# Patient Record
Sex: Female | Born: 1970
Health system: Southern US, Community
[De-identification: ages and names within clinical notes are randomized; demographics above are authoritative.]

## PROBLEM LIST (undated history)

## (undated) DIAGNOSIS — Z9889 Other specified postprocedural states: Secondary | ICD-10-CM

## (undated) DIAGNOSIS — R1031 Right lower quadrant pain: Secondary | ICD-10-CM

## (undated) DIAGNOSIS — F1010 Alcohol abuse, uncomplicated: Secondary | ICD-10-CM

## (undated) DIAGNOSIS — L308 Other specified dermatitis: Secondary | ICD-10-CM

## (undated) DIAGNOSIS — R112 Nausea with vomiting, unspecified: Secondary | ICD-10-CM

## (undated) DIAGNOSIS — R1010 Upper abdominal pain, unspecified: Secondary | ICD-10-CM

## (undated) DIAGNOSIS — J45909 Unspecified asthma, uncomplicated: Secondary | ICD-10-CM

## (undated) DIAGNOSIS — F419 Anxiety disorder, unspecified: Secondary | ICD-10-CM

## (undated) DIAGNOSIS — I10 Essential (primary) hypertension: Secondary | ICD-10-CM

## (undated) DIAGNOSIS — R002 Palpitations: Secondary | ICD-10-CM

## (undated) DIAGNOSIS — M1990 Unspecified osteoarthritis, unspecified site: Secondary | ICD-10-CM

## (undated) DIAGNOSIS — G4700 Insomnia, unspecified: Secondary | ICD-10-CM

## (undated) DIAGNOSIS — S43101A Unspecified dislocation of right acromioclavicular joint, initial encounter: Secondary | ICD-10-CM

## (undated) HISTORY — DX: Insomnia, unspecified: G47.00

## (undated) HISTORY — PX: FRACTURE SURGERY: SHX138

## (undated) HISTORY — PX: TUBAL LIGATION: SHX77

## (undated) HISTORY — DX: Alcohol abuse, uncomplicated: F10.10

## (undated) HISTORY — PX: FOOT FRACTURE SURGERY: SHX645

## (undated) HISTORY — DX: Right lower quadrant pain: R10.31

## (undated) HISTORY — PX: ABDOMINAL HYSTERECTOMY: SHX81

## (undated) HISTORY — DX: Other specified dermatitis: L30.8

## (undated) HISTORY — DX: Unspecified dislocation of right acromioclavicular joint, initial encounter: S43.101A

## (undated) HISTORY — DX: Palpitations: R00.2

## (undated) HISTORY — PX: PERCUTANEOUS PINNING PHALANX FRACTURE OF HAND: SUR1027

## (undated) HISTORY — DX: Anxiety disorder, unspecified: F41.9

## (undated) HISTORY — DX: Upper abdominal pain, unspecified: R10.10

## (undated) HISTORY — DX: Unspecified osteoarthritis, unspecified site: M19.90

---

## 1997-11-28 ENCOUNTER — Inpatient Hospital Stay (HOSPITAL_COMMUNITY): Admission: AD | Admit: 1997-11-28 | Discharge: 1997-11-28 | Payer: Self-pay | Admitting: Obstetrics and Gynecology

## 1998-01-25 ENCOUNTER — Inpatient Hospital Stay (HOSPITAL_COMMUNITY): Admission: AD | Admit: 1998-01-25 | Discharge: 1998-01-28 | Payer: Self-pay | Admitting: Obstetrics and Gynecology

## 1998-02-05 ENCOUNTER — Inpatient Hospital Stay (HOSPITAL_COMMUNITY): Admission: AD | Admit: 1998-02-05 | Discharge: 1998-02-09 | Payer: Self-pay | Admitting: Obstetrics and Gynecology

## 2000-01-18 ENCOUNTER — Encounter: Payer: Self-pay | Admitting: Emergency Medicine

## 2000-01-18 ENCOUNTER — Emergency Department (HOSPITAL_COMMUNITY): Admission: EM | Admit: 2000-01-18 | Discharge: 2000-01-18 | Payer: Self-pay | Admitting: Emergency Medicine

## 2000-06-29 ENCOUNTER — Other Ambulatory Visit: Admission: RE | Admit: 2000-06-29 | Discharge: 2000-06-29 | Payer: Self-pay | Admitting: *Deleted

## 2000-06-29 ENCOUNTER — Other Ambulatory Visit: Admission: RE | Admit: 2000-06-29 | Discharge: 2000-06-29 | Payer: Self-pay | Admitting: Family Medicine

## 2001-08-03 ENCOUNTER — Ambulatory Visit (HOSPITAL_BASED_OUTPATIENT_CLINIC_OR_DEPARTMENT_OTHER): Admission: RE | Admit: 2001-08-03 | Discharge: 2001-08-03 | Payer: Self-pay | Admitting: Orthopedic Surgery

## 2002-01-13 ENCOUNTER — Encounter: Admission: RE | Admit: 2002-01-13 | Discharge: 2002-01-13 | Payer: Self-pay

## 2002-03-09 ENCOUNTER — Encounter: Payer: Self-pay | Admitting: Emergency Medicine

## 2002-03-09 ENCOUNTER — Emergency Department (HOSPITAL_COMMUNITY): Admission: EM | Admit: 2002-03-09 | Discharge: 2002-03-09 | Payer: Self-pay | Admitting: Emergency Medicine

## 2003-06-20 ENCOUNTER — Other Ambulatory Visit: Admission: RE | Admit: 2003-06-20 | Discharge: 2003-06-20 | Payer: Self-pay | Admitting: Obstetrics and Gynecology

## 2010-05-02 ENCOUNTER — Emergency Department (HOSPITAL_COMMUNITY): Admission: EM | Admit: 2010-05-02 | Discharge: 2010-05-02 | Payer: Self-pay | Admitting: Emergency Medicine

## 2010-09-20 ENCOUNTER — Emergency Department
Admission: AD | Admit: 2010-09-20 | Discharge: 2010-09-20 | Payer: Self-pay | Source: Home / Self Care | Attending: Emergency Medicine | Admitting: Emergency Medicine

## 2010-09-22 LAB — CBC
HCT: 34.2 % — ABNORMAL LOW (ref 36.0–46.0)
Hemoglobin: 10.9 g/dL — ABNORMAL LOW (ref 12.0–15.0)
MCH: 26.5 pg (ref 26.0–34.0)
MCHC: 31.9 g/dL (ref 30.0–36.0)
MCV: 83 fL (ref 78.0–100.0)
Platelets: 271 10*3/uL (ref 150–400)
RBC: 4.12 MIL/uL (ref 3.87–5.11)
RDW: 14.4 % (ref 11.5–15.5)
WBC: 8 10*3/uL (ref 4.0–10.5)

## 2010-09-22 LAB — GRAM STAIN

## 2010-09-22 LAB — DIFFERENTIAL
Basophils Absolute: 0 10*3/uL (ref 0.0–0.1)
Basophils Relative: 0 % (ref 0–1)
Eosinophils Absolute: 0.4 10*3/uL (ref 0.0–0.7)
Eosinophils Relative: 5 % (ref 0–5)
Lymphocytes Relative: 20 % (ref 12–46)
Lymphs Abs: 1.6 10*3/uL (ref 0.7–4.0)
Monocytes Absolute: 0.6 10*3/uL (ref 0.1–1.0)
Monocytes Relative: 7 % (ref 3–12)
Neutro Abs: 5.4 10*3/uL (ref 1.7–7.7)
Neutrophils Relative %: 68 % (ref 43–77)

## 2010-09-22 LAB — COMPREHENSIVE METABOLIC PANEL
ALT: 13 U/L (ref 0–35)
AST: 17 U/L (ref 0–37)
Albumin: 3.4 g/dL — ABNORMAL LOW (ref 3.5–5.2)
Alkaline Phosphatase: 46 U/L (ref 39–117)
BUN: 3 mg/dL — ABNORMAL LOW (ref 6–23)
CO2: 25 mEq/L (ref 19–32)
Calcium: 8.4 mg/dL (ref 8.4–10.5)
Chloride: 105 mEq/L (ref 96–112)
Creatinine, Ser: 0.83 mg/dL (ref 0.4–1.2)
GFR calc Af Amer: >60 mL/min (ref >60)
GFR calc non Af Amer: >60 mL/min (ref >60)
Glucose, Bld: 93 mg/dL (ref 70–99)
Potassium: 3.6 mEq/L (ref 3.5–5.1)
Sodium: 137 mEq/L (ref 135–145)
Total Bilirubin: 0.5 mg/dL (ref 0.3–1.2)
Total Protein: 6.2 g/dL (ref 6.0–8.3)

## 2010-09-22 LAB — PROTIME-INR
INR: 0.97 (ref 0.00–1.49)
Prothrombin Time: 13.1 seconds (ref 11.6–15.2)

## 2010-09-22 LAB — APTT: aPTT: 28 seconds (ref 24–37)

## 2010-09-22 LAB — SEDIMENTATION RATE: Sed Rate: 11 mm/hr (ref 0–22)

## 2010-09-22 LAB — SYNOVIAL CELL COUNT + DIFF, W/ CRYSTALS
Crystals, Fluid: NONE SEEN
Lymphocytes-Synovial Fld: 3 % (ref 0–20)
Monocyte-Macrophage-Synovial Fluid: 7 % — ABNORMAL LOW (ref 50–90)
Neutrophil, Synovial: 90 % — ABNORMAL HIGH (ref 0–25)
WBC, Synovial: 7377 /mm3 — ABNORMAL HIGH (ref 0–200)

## 2010-09-29 ENCOUNTER — Emergency Department (HOSPITAL_COMMUNITY)
Admission: EM | Admit: 2010-09-29 | Discharge: 2010-09-29 | Payer: Self-pay | Source: Home / Self Care | Admitting: Emergency Medicine

## 2010-09-29 LAB — BODY FLUID CULTURE: Culture: NO GROWTH

## 2011-01-23 NOTE — Op Note (Signed)
[facility]  Patient:    Kelly Walton, Kelly Walton Visit Number: [id number] MRN: [id number]          Service Type: DSU Location: [facility] Attending Physician:  Marlowe Shores Dictated by:   Artist Pais Mina Marble, M.D. Proc. Date: 08/03/01 Admit Date:  08/03/2001                             Operative Report  PREOPERATIVE DIAGNOSIS:  Retained hardware, right hand.  POSTOPERATIVE DIAGNOSIS:  Retained hardware, right hand.  PROCEDURE:  Removal of hardware x 5, right hand.  SURGEON:  Artist Pais. Mina Marble, M.D.  ASSISTANT:  None.  ANESTHESIA:  General.  TOURNIQUET TIME:  One hour 11 minutes.  DRAINS:  None.  COMPLICATIONS:  Intraoperative screw breakage.  DESCRIPTION:  Patient was taken to the operating room where after the induction of adequate general anesthesia the right upper extremity was prepped and draped in usual sterile fashion.  An Esmarch was used to exsanguinate the limb.  The tourniquet was inflated to 250 mmHg.  At this point in time, a 2 cm incision was made over the interval between the index and long fingers and dissection was carried down toward the index finger.  There were two screws in the metacarpal of the index.  One was removed uneventfully; one the screw head broke and needed to be then removed down to the flush level of the bone and filed with a rasp.  At this point in time a screw in the long finger metacarpal was removed without complication and two from the ring finger were removed through a separate incision 2 cm in length centered over the ring finger metacarpal.  Incisions were then irrigated thoroughly with normal saline.  Hemostasis was achieved with bipolar cautery and then they were both closed with a running 3-0 Prolene subcuticular stitch.  Steri-Strips, 4x4s, fluffs, and compressive dressing as well as a volar splint were applied.  The patient tolerated the procedure well and went to recovery room in  stable fashion. Dictated by:   Artist Pais Mina Marble, M.D. Attending Physician:  Marlowe Shores DD:  08/03/01 TD:  08/03/01 Job: [id number]

## 2011-10-21 ENCOUNTER — Emergency Department (HOSPITAL_COMMUNITY)
Admission: EM | Admit: 2011-10-21 | Discharge: 2011-10-21 | Disposition: A | Discharge status: Home / Self Care | Payer: BC Managed Care – PPO | Attending: Emergency Medicine | Admitting: Emergency Medicine

## 2011-10-21 ENCOUNTER — Encounter (HOSPITAL_COMMUNITY): Payer: Self-pay

## 2011-10-21 ENCOUNTER — Emergency Department (HOSPITAL_COMMUNITY): Payer: BC Managed Care – PPO

## 2011-10-21 DIAGNOSIS — R1032 Left lower quadrant pain: Secondary | ICD-10-CM | POA: Insufficient documentation

## 2011-10-21 DIAGNOSIS — N949 Unspecified condition associated with female genital organs and menstrual cycle: Secondary | ICD-10-CM | POA: Insufficient documentation

## 2011-10-21 DIAGNOSIS — R112 Nausea with vomiting, unspecified: Secondary | ICD-10-CM | POA: Insufficient documentation

## 2011-10-21 DIAGNOSIS — R10814 Left lower quadrant abdominal tenderness: Secondary | ICD-10-CM | POA: Insufficient documentation

## 2011-10-21 DIAGNOSIS — Z79899 Other long term (current) drug therapy: Secondary | ICD-10-CM | POA: Insufficient documentation

## 2011-10-21 DIAGNOSIS — N730 Acute parametritis and pelvic cellulitis: Secondary | ICD-10-CM | POA: Insufficient documentation

## 2011-10-21 LAB — CBC
HCT: 34.5 % — ABNORMAL LOW (ref 36.0–46.0)
Hemoglobin: 11.2 g/dL — ABNORMAL LOW (ref 12.0–15.0)
MCH: 25.5 pg — ABNORMAL LOW (ref 26.0–34.0)
MCHC: 32.5 g/dL (ref 30.0–36.0)
MCV: 78.6 fL (ref 78.0–100.0)
Platelets: 271 10*3/uL (ref 150–400)
RBC: 4.39 MIL/uL (ref 3.87–5.11)
RDW: 14.8 % (ref 11.5–15.5)
WBC: 16.4 10*3/uL — ABNORMAL HIGH (ref 4.0–10.5)

## 2011-10-21 LAB — BASIC METABOLIC PANEL
BUN: 9 mg/dL (ref 6–23)
CO2: 22 mEq/L (ref 19–32)
Calcium: 8.9 mg/dL (ref 8.4–10.5)
Chloride: 107 mEq/L (ref 96–112)
Creatinine, Ser: 0.77 mg/dL (ref 0.50–1.10)
GFR calc Af Amer: >90 mL/min (ref >90)
GFR calc non Af Amer: >90 mL/min (ref >90)
Glucose, Bld: 111 mg/dL — ABNORMAL HIGH (ref 70–99)
Potassium: 3.8 mEq/L (ref 3.5–5.1)
Sodium: 142 mEq/L (ref 135–145)

## 2011-10-21 LAB — DIFFERENTIAL
Basophils Absolute: 0 10*3/uL (ref 0.0–0.1)
Basophils Relative: 0 % (ref 0–1)
Eosinophils Absolute: 0 10*3/uL (ref 0.0–0.7)
Eosinophils Relative: 0 % (ref 0–5)
Lymphocytes Relative: 3 % — ABNORMAL LOW (ref 12–46)
Lymphs Abs: 0.5 10*3/uL — ABNORMAL LOW (ref 0.7–4.0)
Monocytes Absolute: 0.3 10*3/uL (ref 0.1–1.0)
Monocytes Relative: 2 % — ABNORMAL LOW (ref 3–12)
Neutro Abs: 15.5 10*3/uL — ABNORMAL HIGH (ref 1.7–7.7)
Neutrophils Relative %: 95 % — ABNORMAL HIGH (ref 43–77)

## 2011-10-21 LAB — URINALYSIS, ROUTINE W REFLEX MICROSCOPIC
Bilirubin Urine: NEGATIVE
Glucose, UA: NEGATIVE mg/dL
Hgb urine dipstick: NEGATIVE
Ketones, ur: 15 mg/dL — AB
Leukocytes, UA: NEGATIVE
Nitrite: NEGATIVE
Protein, ur: NEGATIVE mg/dL
Specific Gravity, Urine: 1.017 (ref 1.005–1.030)
Urobilinogen, UA: 0.2 mg/dL (ref 0.0–1.0)
pH: 7.5 (ref 5.0–8.0)

## 2011-10-21 LAB — WET PREP, GENITAL: Trich, Wet Prep: NONE SEEN

## 2011-10-21 LAB — POCT PREGNANCY, URINE: Preg Test, Ur: NEGATIVE

## 2011-10-21 MED ORDER — OXYCODONE-ACETAMINOPHEN 5-325 MG PO TABS: ORAL_TABLET | ORAL | Status: AC

## 2011-10-21 MED ORDER — HYDROMORPHONE HCL PF 1 MG/ML IJ SOLN
0.5000 mg | Freq: Once | INTRAMUSCULAR | Status: AC
  Administered 2011-10-21: 1 mg via INTRAVENOUS
  Filled 2011-10-21: qty 1

## 2011-10-21 MED ORDER — SODIUM CHLORIDE 0.9 % IV BOLUS (SEPSIS)
500.0000 mL | Freq: Once | INTRAVENOUS | Status: AC
  Administered 2011-10-21: 1000 mL via INTRAVENOUS

## 2011-10-21 MED ORDER — ONDANSETRON HCL 4 MG/2ML IJ SOLN
4.0000 mg | Freq: Once | INTRAMUSCULAR | Status: AC
  Administered 2011-10-21: 4 mg via INTRAVENOUS
  Filled 2011-10-21: qty 2

## 2011-10-21 MED ORDER — DOXYCYCLINE HYCLATE 100 MG PO CAPS: 100.0000 mg | ORAL_CAPSULE | Freq: Two times a day (BID) | ORAL | Status: AC

## 2011-10-21 MED ORDER — IBUPROFEN 800 MG PO TABS: 800.0000 mg | ORAL_TABLET | Freq: Three times a day (TID) | ORAL | Status: AC

## 2011-10-21 MED ORDER — ONDANSETRON HCL 4 MG PO TABS: 4.0000 mg | ORAL_TABLET | Freq: Four times a day (QID) | ORAL | Status: AC

## 2011-10-21 MED ORDER — LIDOCAINE HCL (PF) 1 % IJ SOLN
INTRAMUSCULAR | Status: AC
  Administered 2011-10-21: 1 mL
  Filled 2011-10-21: qty 5

## 2011-10-21 MED ORDER — HYDROMORPHONE HCL PF 1 MG/ML IJ SOLN
1.0000 mg | Freq: Once | INTRAMUSCULAR | Status: AC
  Administered 2011-10-21: 1 mg via INTRAVENOUS
  Filled 2011-10-21: qty 1

## 2011-10-21 MED ORDER — CEFTRIAXONE SODIUM 250 MG IJ SOLR
250.0000 mg | Freq: Once | INTRAMUSCULAR | Status: AC
  Administered 2011-10-21: 250 mg via INTRAMUSCULAR
  Filled 2011-10-21: qty 250

## 2011-10-21 MED ORDER — AZITHROMYCIN 1 G PO PACK: 1.0000 g | PACK | Freq: Once | ORAL | Status: DC

## 2011-10-21 MED ORDER — KETOROLAC TROMETHAMINE 30 MG/ML IJ SOLN
30.0000 mg | Freq: Once | INTRAMUSCULAR | Status: AC
  Administered 2011-10-21: 30 mg via INTRAVENOUS
  Filled 2011-10-21: qty 1

## 2011-10-21 NOTE — ED Notes (Signed)
Patient transported to Ultrasound 

## 2011-10-21 NOTE — Discharge Instructions (Signed)
Take antibiotic for complete course. Use ibuprofen for inflammation and pain with percocet for breakthrough pain but do not drive or operate machinery with percocet use. You may need an over the counter stool softener with percocet use. Use zofran as needed for nausea. Follow up with your primary care provider as needed for recheck of ongoing mild pain but return to ER for increasing pain, fevers, chills, increasing hip pain or any other changing or worsening of symptoms.   Pelvic Inflammatory Disease Pelvic Inflammatory Disease (PID) is an infection in some or all of your female organs. This includes the womb (uterus), ovaries, fallopian tubes and tissues in the pelvis. PID is a common cause of sudden onset (acute) lower abdominal (pelvic) pain. PID can be treated, but it is a serious infection. It may take weeks before you are completely well. In some cases, hospitalization is needed for surgery or to administer medications to kill germs (antibiotics) through your veins (intravenously). CAUSES   It may be caused by germs that are spread during sexual contact.   PID can also occur following:   The birth of a baby.   A miscarriage.   An abortion.   Major surgery of the pelvis.   Use of an IUD.   Sexual assault.  SYMPTOMS   Abdominal or pelvic pain.   Fever.   Chills.   Abnormal vaginal discharge.  DIAGNOSIS  Your caregiver will choose some of these methods to make a diagnosis:  A physical exam and history.   Blood tests.   Cultures of the vagina and cervix.   X-rays or ultrasound.   A procedure to look inside the pelvis (laparoscopy).  TREATMENT   Use of antibiotics by mouth or intravenously.   Treatment of sexual partners when the infection is an sexually transmitted disease (STD).   Hospitalization and surgery may be needed.  RISKS AND COMPLICATIONS   PID can cause women to become unable to have children (sterile) if left untreated or if partially treated. That is  why it is important to finish all medications given to you.   Sterility or future tubal (ectopic) pregnancies can occur in fully treated individuals. This is why it is so important to follow your prescribed treatment.   It can cause longstanding (chronic) pelvic pain after frequent infections.   Painful intercourse.   Pelvic abscesses.   In rare cases, surgery or a hysterectomy may be needed.   If this is a sexually transmitted infection (STI), you are also at risk for any other STD including AIDSor human papillomavirus (HPV).  HOME CARE INSTRUCTIONS   Finish all medication as prescribed. Incomplete treatment will put you at risk for sterility and tubal pregnancy.   Only take over-the-counter or prescription medicines for pain, discomfort, or fever as directed by your caregiver.   Do not have sex until treatment is completed or as directed by your caregiver. If PID is confirmed, your recent sexual contacts will need treatment.   Keep your follow-up appointments.  SEEK MEDICAL CARE IF:   You have increased or abnormal vaginal discharge.   You need prescription medication for your pain.   Your partner has an STD.   You are vomiting.   You cannot take your medications.  SEEK IMMEDIATE MEDICAL CARE IF:   You have a fever.   You develop increased abdominal or pelvic pain.   You develop chills.   You have pain when you urinate.   You are not better after 72 hours following treatment.  Document Released: 08/24/2005 Document Revised: 05/06/2011 Document Reviewed: 05/07/2007 [facility] Patient Information 2012 [place], [place].

## 2011-10-21 NOTE — ED Provider Notes (Signed)
Medical screening examination/treatment/procedure(s) were performed by non-physician practitioner and as supervising physician I was immediately available for consultation/collaboration.   Jaquasia Doscher, MD 10/21/11 1551 

## 2011-10-21 NOTE — ED Notes (Signed)
Lt groin pain onset at 0200 this morning

## 2011-10-21 NOTE — ED Provider Notes (Signed)
History     CSN: [id number]  Arrival date & time 10/21/11  0807   First MD Initiated Contact with Patient 10/21/11 [phone]      Chief Complaint  Patient presents with  . Groin Pain    (Consider location/radiation/quality/duration/timing/severity/associated sxs/prior treatment) HPI  Patient presents to the ER complaining of acute onset LLQ pain that woke her from her sleep at 2am with persistent severe, constant pain with associated nausea and vomiting x 2. Patient states she is currently menstruating. Patient states pain is similar to pain she had approximately a year ago when she "had some fluid pulled off left hip" but she is unable to give more specific details to that episode of pain or diagnosis. Denies fevers, chills, CP, SOB, diarrhea, dysuria, hematuria, blood in her stool, vaginal d/c or pelvic pain. Has taken nothing for pain PTA. Denies aggravating or alleviating factors.   No past medical history on file.  Past Surgical History  Procedure Date  . Tubal ligation     No family history on file.  History  Substance Use Topics  . Smoking status: Not on file  . Smokeless tobacco: Never Used  . Alcohol Use: Yes    OB History    Grav Para Term Preterm Abortions TAB SAB Ect Mult Living                  Review of Systems  All other systems reviewed and are negative.    Allergies  Phenergan and Sulfa antibiotics  Home Medications   Current Outpatient Rx  Name Route Sig Dispense Refill  . ALPRAZOLAM 0.25 MG PO TABS Oral Take 0.25 mg by mouth at bedtime as needed. As needed for sleep.      BP 138/93  Pulse 102  Temp(Src) 96.7 F (35.9 C) (Oral)  Resp 16  SpO2 97%  LMP 10/17/2011  Physical Exam  Nursing note and vitals reviewed. Constitutional: She is oriented to person, place, and time. She appears well-developed and well-nourished. No distress.       Uncomfortable appearing  HENT:  Head: Normocephalic and atraumatic.  Eyes: Conjunctivae are normal.    Neck: Normal range of motion. Neck supple.  Cardiovascular: Normal rate, regular rhythm, normal heart sounds and intact distal pulses.  Exam reveals no gallop and no friction rub.   No murmur heard. Pulmonary/Chest: Effort normal and breath sounds normal. No respiratory distress. She has no wheezes. She has no rales. She exhibits no tenderness.  Abdominal: Soft. Bowel sounds are normal. She exhibits no distension and no mass. There is tenderness. There is no rebound and no guarding.       LLQ with guarding but no rididity  Genitourinary: Vagina normal. Cervix exhibits motion tenderness. Cervix exhibits no discharge and no friability. Right adnexum displays no mass, no tenderness and no fullness. Left adnexum displays tenderness. Left adnexum displays no mass and no fullness.       Scant blood tinged mucus d/c from cervix  Musculoskeletal: Normal range of motion. She exhibits no edema and no tenderness.  Neurological: She is alert and oriented to person, place, and time.  Skin: Skin is warm and dry. No rash noted. She is not diaphoretic. No erythema.  Psychiatric: She has a normal mood and affect.    ED Course  Procedures (including critical care time)  IV dilaudid and zofran  Labs Reviewed  CBC - Abnormal; Notable for the following:    WBC 16.4 (*)    Hemoglobin 11.2 (*)  HCT 34.5 (*)    MCH 25.5 (*)    All other components within normal limits  DIFFERENTIAL - Abnormal; Notable for the following:    Neutrophils Relative 95 (*)    Neutro Abs 15.5 (*)    Lymphocytes Relative 3 (*)    Lymphs Abs 0.5 (*)    Monocytes Relative 2 (*)    All other components within normal limits  BASIC METABOLIC PANEL - Abnormal; Notable for the following:    Glucose, Bld 111 (*)    All other components within normal limits  URINALYSIS, ROUTINE W REFLEX MICROSCOPIC - Abnormal; Notable for the following:    Ketones, ur 15 (*)    All other components within normal limits  WET PREP, GENITAL -  Abnormal; Notable for the following:    Clue Cells Wet Prep HPF POC FEW (*)    WBC, Wet Prep HPF POC MODERATE (*)    All other components within normal limits  POCT PREGNANCY, URINE  GC/CHLAMYDIA PROBE AMP, GENITAL   US Transvaginal Non-ob  10/21/2011  *RADIOLOGY REPORT*  Clinical Data: Pelvic pain.  TRANSABDOMINAL AND TRANSVAGINAL ULTRASOUND OF PELVIS DOPPLER ULTRASOUND OF OVARIES  Technique:  Both transabdominal and transvaginal ultrasound examinations of the pelvis were performed including evaluation of the uterus, ovaries, adnexal regions, and pelvic cul-de-sac. Color and duplex Doppler ultrasound was utilized to evaluate blood flow to the ovaries.  Comparison: None.  Findings:  Uterus: 7.5 x 3.4 x 4.1 cm.  Normal echotexture.  No focal abnormality.  Endometrium: Normal appearance and thickness, 6 mm.  Right Ovary: 2.2 x 1.3 x 1.8 cm. Normal size and echotexture.  No adnexal masses.  Normal arterial and venous blood flow.  Left Ovary: 2.4 x 1.4 x 2.2 cm. Normal size and echotexture.  No adnexal masses.  Normal arterial venous blood flow.  Other Findings:  No free fluid.  IMPRESSION: Unremarkable study.  No evidence of ovarian torsion.  Original Report Authenticated By: Cyndie Chime, M.D.   US Pelvis Complete  10/21/2011  *RADIOLOGY REPORT*  Clinical Data: Pelvic pain.  TRANSABDOMINAL AND TRANSVAGINAL ULTRASOUND OF PELVIS DOPPLER ULTRASOUND OF OVARIES  Technique:  Both transabdominal and transvaginal ultrasound examinations of the pelvis were performed including evaluation of the uterus, ovaries, adnexal regions, and pelvic cul-de-sac. Color and duplex Doppler ultrasound was utilized to evaluate blood flow to the ovaries.  Comparison: None.  Findings:  Uterus: 7.5 x 3.4 x 4.1 cm.  Normal echotexture.  No focal abnormality.  Endometrium: Normal appearance and thickness, 6 mm.  Right Ovary: 2.2 x 1.3 x 1.8 cm. Normal size and echotexture.  No adnexal masses.  Normal arterial and venous blood flow.   Left Ovary: 2.4 x 1.4 x 2.2 cm. Normal size and echotexture.  No adnexal masses.  Normal arterial venous blood flow.  Other Findings:  No free fluid.  IMPRESSION: Unremarkable study.  No evidence of ovarian torsion.  Original Report Authenticated By: Cyndie Chime, M.D.   [place] Art/ven Flow Abd Pelv Doppler  10/21/2011  *RADIOLOGY REPORT*  Clinical Data: Pelvic pain.  TRANSABDOMINAL AND TRANSVAGINAL ULTRASOUND OF PELVIS DOPPLER ULTRASOUND OF OVARIES  Technique:  Both transabdominal and transvaginal ultrasound examinations of the pelvis were performed including evaluation of the uterus, ovaries, adnexal regions, and pelvic cul-de-sac. Color and duplex Doppler ultrasound was utilized to evaluate blood flow to the ovaries.  Comparison: None.  Findings:  Uterus: 7.5 x 3.4 x 4.1 cm.  Normal echotexture.  No focal abnormality.  Endometrium: Normal appearance and  thickness, 6 mm.  Right Ovary: 2.2 x 1.3 x 1.8 cm. Normal size and echotexture.  No adnexal masses.  Normal arterial and venous blood flow.  Left Ovary: 2.4 x 1.4 x 2.2 cm. Normal size and echotexture.  No adnexal masses.  Normal arterial venous blood flow.  Other Findings:  No free fluid.  IMPRESSION: Unremarkable study.  No evidence of ovarian torsion.  Original Report Authenticated By: Cyndie Chime, M.D.   Patient is feeling better with decreased pain after IV pain meds. No Pain with ROM of hip. Afebrile. VSS.   I reviewed prior visit with MR showing effusion of left hip but patient now stating that with that pain she could not bear weight on hip and that any movement of pain hurt in lateral hip and groin which is different from LLQ/inguinal pain presently.   1. PID (acute pelvic inflammatory disease)       MDM  CMT and adnexal TTP with elevated white count on wet prep and elevated white count on CBC. Question PID. Abdomen without rigidity and LLQ improved. Spoke at length with patient changing hip pain or abdominal pain that would warrant return  to ER for CT scan of abd/pelvis or MR of hip, otherwise patient is agreeable to treatment for PID with abx and pain meds and following up with PCP.         Jenness Corner, [place] 10/21/11 1220

## 2011-10-21 NOTE — ED Notes (Signed)
In and out cath completed using sterile technique, clear urine return. Pt tolerated well.

## 2011-10-22 LAB — GC/CHLAMYDIA PROBE AMP, GENITAL
Chlamydia, DNA Probe: NEGATIVE
GC Probe Amp, Genital: NEGATIVE

## 2012-06-23 ENCOUNTER — Encounter (HOSPITAL_COMMUNITY): Payer: Self-pay | Admitting: Family Medicine

## 2012-06-23 ENCOUNTER — Emergency Department (HOSPITAL_COMMUNITY): Payer: BC Managed Care – PPO

## 2012-06-23 ENCOUNTER — Emergency Department (HOSPITAL_COMMUNITY)
Admission: EM | Admit: 2012-06-23 | Discharge: 2012-06-23 | Disposition: A | Discharge status: Home / Self Care | Payer: BC Managed Care – PPO | Attending: Emergency Medicine | Admitting: Emergency Medicine

## 2012-06-23 DIAGNOSIS — Z79899 Other long term (current) drug therapy: Secondary | ICD-10-CM | POA: Insufficient documentation

## 2012-06-23 DIAGNOSIS — R079 Chest pain, unspecified: Secondary | ICD-10-CM | POA: Insufficient documentation

## 2012-06-23 LAB — TROPONIN I: Troponin I: <0.3 ng/mL (ref <0.30)

## 2012-06-23 LAB — CBC WITH DIFFERENTIAL/PLATELET
Basophils Absolute: 0 10*3/uL (ref 0.0–0.1)
Basophils Relative: 1 % (ref 0–1)
Hemoglobin: 10.3 g/dL — ABNORMAL LOW (ref 12.0–15.0)
Lymphocytes Relative: 26 % (ref 12–46)
MCHC: 31.8 g/dL (ref 30.0–36.0)
Monocytes Relative: 7 % (ref 3–12)
Neutro Abs: 4.3 10*3/uL (ref 1.7–7.7)
Neutrophils Relative %: 62 % (ref 43–77)
WBC: 7 10*3/uL (ref 4.0–10.5)

## 2012-06-23 LAB — BASIC METABOLIC PANEL
BUN: 15 mg/dL (ref 6–23)
GFR calc Af Amer: 78 mL/min — ABNORMAL LOW (ref >90)
GFR calc non Af Amer: 67 mL/min — ABNORMAL LOW (ref >90)
Potassium: 4.2 mEq/L (ref 3.5–5.1)
Sodium: 137 mEq/L (ref 135–145)

## 2012-06-23 LAB — POCT I-STAT TROPONIN I

## 2012-06-23 MED ORDER — GI COCKTAIL ~~LOC~~
30.0000 mL | Freq: Once | ORAL | Status: AC
  Administered 2012-06-23: 30 mL via ORAL
  Filled 2012-06-23: qty 30

## 2012-06-23 MED ORDER — MORPHINE SULFATE 4 MG/ML IJ SOLN
4.0000 mg | Freq: Once | INTRAMUSCULAR | Status: AC
  Administered 2012-06-23: 4 mg via INTRAVENOUS
  Filled 2012-06-23: qty 1

## 2012-06-23 MED ORDER — ONDANSETRON HCL 4 MG/2ML IJ SOLN
4.0000 mg | Freq: Once | INTRAMUSCULAR | Status: AC
  Administered 2012-06-23: 4 mg via INTRAVENOUS
  Filled 2012-06-23: qty 2

## 2012-06-23 MED ORDER — HYDROCODONE-ACETAMINOPHEN 5-500 MG PO TABS: 1.0000 | ORAL_TABLET | Freq: Four times a day (QID) | ORAL | Status: DC | PRN

## 2012-06-23 MED ORDER — ASPIRIN 81 MG PO CHEW
324.0000 mg | CHEWABLE_TABLET | Freq: Once | ORAL | Status: AC
  Administered 2012-06-23: 324 mg via ORAL
  Filled 2012-06-23: qty 4

## 2012-06-23 NOTE — ED Provider Notes (Addendum)
Medical screening examination/treatment/procedure(s) were conducted as a shared visit with non-physician practitioner(s) and myself.  I personally evaluated the patient during the encounter  Pt stable.  No pleuritic type CP reported. PERC negative She is well appearing.  Only having brief episodes of pain in the room (lasts seconds) Suspicion for ACS/PE/Dissection is low.  I have reviewed EKG.  If repeat troponin negative can f/u as outpatient.  Joya Gaskins, MD 06/23/12 1618  4:47 PM Repeat EKG unchanged.  Repeat troponin negative.  She reports brief (one second) episode of CP without any associated symptoms.  PERC negative and I doubt PE.  She has some tenderness to palpation Very low risk for ACS, only risk factor is HTN and minimally suspicious with low HEART score.  Given this just happened today, doubt unstable angina.  She reports she was suppposed to see cardiology per her PCP for "skipped heart beats"  Advised her to go that appointment  Joya Gaskins, MD 06/23/12 1649

## 2012-06-23 NOTE — ED Notes (Signed)
Per pt was at work and had sudden onset of chest pain and then felt nauseous and vomiting. Pt denies cough, fever. Denies pain on palpation or movement.

## 2012-06-23 NOTE — ED Provider Notes (Signed)
History     CSN: [id number]  Arrival date & time 06/23/12  1223   First MD Initiated Contact with Patient 06/23/12 1256      Chief Complaint  Patient presents with  . Chest Pain    (Consider location/radiation/quality/duration/timing/severity/associated sxs/prior treatment) HPI Patient presents to the emergency department for evaluation of chest pain that was acute in onset and associated with diaphoresis, nausea and vomiting. She states that in which she was sitting and watching TV and had intermittent sharp knifelike chest pains in her left chest and under her breasts. She has not had any problems since then however she was retaining today when the pain started acutely and lasted for approximately 10 minutes but not resolving. At that point she decided to come to the emergency department. Since being in the ER the pain has been intermittent. Nothing seems to make them better or worse. Denies hx of cardiac dz.   History reviewed. No pertinent past medical history.  Past Surgical History  Procedure Date  . Tubal ligation     History reviewed. No pertinent family history.  History  Substance Use Topics  . Smoking status: Not on file  . Smokeless tobacco: Never Used  . Alcohol Use: Yes    OB History    Grav Para Term Preterm Abortions TAB SAB Ect Mult Living                  Review of Systems   Review of Systems  Gen: no weight loss, fevers, chills, night sweats  Eyes: no discharge or drainage, no occular pain or visual changes  Nose: no epistaxis or rhinorrhea  Mouth: no dental pain, no sore throat  Neck: no neck pain  Lungs:No wheezing, coughing or hemoptysis CV: + chest pain, no palpitations, dependent edema or orthopnea  Abd: no abdominal pain, + nausea, vomiting  GU: no dysuria or gross hematuria  MSK:  No abnormalities  Neuro: no headache, no focal neurologic deficits  Skin: no abnormalities Psyche: negative.    Allergies  Promethazine hcl and Sulfa  antibiotics  Home Medications   Current Outpatient Rx  Name Route Sig Dispense Refill  . ALBUTEROL SULFATE HFA 108 (90 BASE) MCG/ACT IN AERS Inhalation Inhale 2 puffs into the lungs every 4 (four) hours as needed. For shortness of breath    . HYDROCODONE-ACETAMINOPHEN 5-325 MG PO TABS Oral Take 1 tablet by mouth every 6 (six) hours as needed. For pain    . LISINOPRIL-HYDROCHLOROTHIAZIDE 20-12.5 MG PO TABS Oral Take 1 tablet by mouth daily.    Marland Kitchen ZOLPIDEM TARTRATE 10 MG PO TABS Oral Take 10 mg by mouth at bedtime as needed. For sleep    . HYDROCODONE-ACETAMINOPHEN 5-500 MG PO TABS Oral Take 1-2 tablets by mouth every 6 (six) hours as needed for pain. 15 tablet 0    BP 153/97  Pulse 86  Temp 98.1 F (36.7 C) (Oral)  Resp 20  SpO2 100%  LMP 05/31/2012  Physical Exam  Nursing note and vitals reviewed. Constitutional: She appears well-developed and well-nourished. No distress.  HENT:  Head: Normocephalic and atraumatic.  Eyes: Pupils are equal, round, and reactive to light.  Neck: Normal range of motion. Neck supple.  Cardiovascular: Normal rate and regular rhythm.   Pulmonary/Chest: Effort normal. She has no wheezes. She has no rales.  Abdominal: Soft. She exhibits no distension. There is no tenderness. There is no guarding.  Neurological: She is alert.  Skin: Skin is warm and dry.  ED Course  Procedures (including critical care time)  Labs Reviewed  BASIC METABOLIC PANEL - Abnormal; Notable for the following:    GFR calc non Af Amer 67 (*)     GFR calc Af Amer 78 (*)     All other components within normal limits  CBC WITH DIFFERENTIAL - Abnormal; Notable for the following:    Hemoglobin 10.3 (*)     HCT 32.4 (*)     MCV 77.0 (*)     MCH 24.5 (*)     RDW 16.1 (*)     All other components within normal limits  POCT I-STAT TROPONIN I  TROPONIN I   Dg Chest 2 View  06/23/2012  *RADIOLOGY REPORT*  Clinical Data: Chest pain shortness of breath  CHEST - 2 VIEW   Comparison: None.  Findings: The heart and pulmonary vascularity are within normal limits.  The lungs are clear bilaterally.  Bilateral nipple rings are seen.  No acute bony abnormality is noted.  IMPRESSION: No acute abnormality noted.   Original Report Authenticated By: Phillips Odor, M.D.      1. Chest pain       MDM  pts HEART score is 1 : Moderately Suspicious. No EKG changes, < 29 yo, No known risk factors, negative Troponin.   Date: 06/23/2012  Rate: 88  Rhythm: normal sinus rhythm  QRS Axis: normal  Intervals: normal  ST/T Wave abnormalities: normal  Conduction Disutrbances:none  Narrative Interpretation:   Old EKG Reviewed: none available '   Will repeat Troponin at 3:30pm. Pt hand off to Freda Munro, PA-C at end of shift       Dorthula Matas, PA 06/23/12 1512  Dorthula Matas, PA 06/23/12 1519

## 2012-06-23 NOTE — ED Notes (Signed)
Pt reports sharp intermittent pain in lower right quad. 7/10

## 2013-03-13 ENCOUNTER — Other Ambulatory Visit (HOSPITAL_COMMUNITY): Payer: Self-pay | Admitting: Cardiovascular Disease

## 2013-03-13 ENCOUNTER — Telehealth (HOSPITAL_COMMUNITY): Payer: Self-pay | Admitting: Cardiovascular Disease

## 2013-03-13 DIAGNOSIS — I10 Essential (primary) hypertension: Secondary | ICD-10-CM

## 2013-03-13 DIAGNOSIS — R079 Chest pain, unspecified: Secondary | ICD-10-CM

## 2013-03-13 NOTE — Telephone Encounter (Signed)
Left message for patient to call and schedule appts.. Letter sent to patient

## 2013-04-03 ENCOUNTER — Telehealth (HOSPITAL_COMMUNITY): Payer: Self-pay | Admitting: Cardiovascular Disease

## 2013-04-11 ENCOUNTER — Telehealth (HOSPITAL_COMMUNITY): Payer: Self-pay | Admitting: Cardiovascular Disease

## 2013-05-09 ENCOUNTER — Telehealth (HOSPITAL_COMMUNITY): Payer: Self-pay | Admitting: Cardiovascular Disease

## 2013-05-12 ENCOUNTER — Telehealth (HOSPITAL_COMMUNITY): Payer: Self-pay | Admitting: *Deleted

## 2013-05-12 NOTE — Telephone Encounter (Signed)
  [facility] LOC[facility] AT [facility] [address] [place], [place] [address] [phone]   Date:05/12/2013  Dear Dr. Alanda Amass  Our office has attempted to contact your patient Kelly Walton  twice by telephone and we have also sent an appointment letter to schedule the Stress Test, Echocardiogram, and Renal Doppler test you ordered. The patient has not responded. We will not make any further attempts to contact the patient. If any further assistance is needed for this referral, please contact our office at [phone]  Sincerely, [facility] Scheduling Team

## 2013-05-24 ENCOUNTER — Telehealth (HOSPITAL_COMMUNITY): Payer: Self-pay | Admitting: *Deleted

## 2013-11-13 ENCOUNTER — Encounter (HOSPITAL_COMMUNITY): Payer: Self-pay | Admitting: Emergency Medicine

## 2013-11-13 ENCOUNTER — Emergency Department (HOSPITAL_COMMUNITY): Payer: BC Managed Care – PPO

## 2013-11-13 ENCOUNTER — Emergency Department (HOSPITAL_COMMUNITY)
Admission: EM | Admit: 2013-11-13 | Discharge: 2013-11-13 | Disposition: A | Discharge status: Home / Self Care | Payer: BC Managed Care – PPO | Attending: Emergency Medicine | Admitting: Emergency Medicine

## 2013-11-13 DIAGNOSIS — Z3202 Encounter for pregnancy test, result negative: Secondary | ICD-10-CM | POA: Insufficient documentation

## 2013-11-13 DIAGNOSIS — I10 Essential (primary) hypertension: Secondary | ICD-10-CM | POA: Insufficient documentation

## 2013-11-13 DIAGNOSIS — R000 Tachycardia, unspecified: Secondary | ICD-10-CM | POA: Insufficient documentation

## 2013-11-13 DIAGNOSIS — R110 Nausea: Secondary | ICD-10-CM | POA: Insufficient documentation

## 2013-11-13 DIAGNOSIS — Z79899 Other long term (current) drug therapy: Secondary | ICD-10-CM | POA: Insufficient documentation

## 2013-11-13 DIAGNOSIS — R079 Chest pain, unspecified: Secondary | ICD-10-CM

## 2013-11-13 DIAGNOSIS — J45909 Unspecified asthma, uncomplicated: Secondary | ICD-10-CM | POA: Insufficient documentation

## 2013-11-13 DIAGNOSIS — R0789 Other chest pain: Secondary | ICD-10-CM | POA: Insufficient documentation

## 2013-11-13 DIAGNOSIS — R42 Dizziness and giddiness: Secondary | ICD-10-CM | POA: Insufficient documentation

## 2013-11-13 HISTORY — DX: Unspecified asthma, uncomplicated: J45.909

## 2013-11-13 HISTORY — DX: Essential (primary) hypertension: I10

## 2013-11-13 LAB — BASIC METABOLIC PANEL
BUN: 12 mg/dL (ref 6–23)
CHLORIDE: 99 meq/L (ref 96–112)
CO2: 24 meq/L (ref 19–32)
CREATININE: 0.95 mg/dL (ref 0.50–1.10)
Calcium: 9.4 mg/dL (ref 8.4–10.5)
GFR calc Af Amer: 85 mL/min — ABNORMAL LOW (ref >90)
GFR calc non Af Amer: 73 mL/min — ABNORMAL LOW (ref >90)
Glucose, Bld: 95 mg/dL (ref 70–99)
Potassium: 3.8 mEq/L (ref 3.7–5.3)
Sodium: 139 mEq/L (ref 137–147)

## 2013-11-13 LAB — CBC
HEMATOCRIT: 36.1 % (ref 36.0–46.0)
Hemoglobin: 12 g/dL (ref 12.0–15.0)
MCH: 27.2 pg (ref 26.0–34.0)
MCHC: 33.2 g/dL (ref 30.0–36.0)
MCV: 81.9 fL (ref 78.0–100.0)
Platelets: 255 10*3/uL (ref 150–400)
RBC: 4.41 MIL/uL (ref 3.87–5.11)
RDW: 14.6 % (ref 11.5–15.5)
WBC: 9.2 10*3/uL (ref 4.0–10.5)

## 2013-11-13 LAB — TROPONIN I

## 2013-11-13 LAB — POC URINE PREG, ED: PREG TEST UR: NEGATIVE

## 2013-11-13 LAB — PRO B NATRIURETIC PEPTIDE: Pro B Natriuretic peptide (BNP): 103.4 pg/mL (ref 0–125)

## 2013-11-13 LAB — I-STAT TROPONIN, ED: Troponin i, poc: 0 ng/mL (ref 0.00–0.08)

## 2013-11-13 MED ORDER — HYDROCODONE-ACETAMINOPHEN 5-325 MG PO TABS: 1.0000 | ORAL_TABLET | Freq: Three times a day (TID) | ORAL | Status: DC | PRN

## 2013-11-13 MED ORDER — KETOROLAC TROMETHAMINE 15 MG/ML IJ SOLN
30.0000 mg | Freq: Once | INTRAMUSCULAR | Status: AC
  Administered 2013-11-13: 30 mg via INTRAVENOUS
  Filled 2013-11-13: qty 2

## 2013-11-13 MED ORDER — FENTANYL CITRATE 0.05 MG/ML IJ SOLN
50.0000 ug | Freq: Once | INTRAMUSCULAR | Status: AC
  Administered 2013-11-13: 50 ug via INTRAVENOUS
  Filled 2013-11-13: qty 2

## 2013-11-13 MED ORDER — ONDANSETRON HCL 4 MG/2ML IJ SOLN
4.0000 mg | Freq: Once | INTRAMUSCULAR | Status: AC
  Administered 2013-11-13: 4 mg via INTRAVENOUS
  Filled 2013-11-13: qty 2

## 2013-11-13 MED ORDER — IBUPROFEN 600 MG PO TABS: 600.0000 mg | ORAL_TABLET | Freq: Three times a day (TID) | ORAL | Status: AC

## 2013-11-13 MED ORDER — OMEPRAZOLE 20 MG PO CPDR: 20.0000 mg | DELAYED_RELEASE_CAPSULE | Freq: Every day | ORAL | Status: DC

## 2013-11-13 NOTE — ED Notes (Signed)
Pt. Is unable to use the restroom at this time but is aware that we need a urine specimen. 

## 2013-11-13 NOTE — Discharge Instructions (Signed)
As discussed, your evaluation today has been largely reassuring.  But, it is important that you monitor your condition carefully, and do not hesitate to return to the ED if you develop new, or concerning changes in your condition.  Your condition may be due to inflammation of the chest wall or esophagus and / or stomach  Otherwise, please follow-up with your physician for appropriate ongoing care.   Chest Pain (Nonspecific) It is often hard to give a specific diagnosis for the cause of chest pain. There is always a chance that your pain could be related to something serious, such as a heart attack or a blood clot in the lungs. You need to follow up with your caregiver for further evaluation. CAUSES   Heartburn.  Pneumonia or bronchitis.  Anxiety or stress.  Inflammation around your heart (pericarditis) or lung (pleuritis or pleurisy).  A blood clot in the lung.  A collapsed lung (pneumothorax). It can develop suddenly on its own (spontaneous pneumothorax) or from injury (trauma) to the chest.  Shingles infection (herpes zoster virus). The chest wall is composed of bones, muscles, and cartilage. Any of these can be the source of the pain.  The bones can be bruised by injury.  The muscles or cartilage can be strained by coughing or overwork.  The cartilage can be affected by inflammation and become sore (costochondritis). DIAGNOSIS  Lab tests or other studies, such as X-rays, electrocardiography, stress testing, or cardiac imaging, may be needed to find the cause of your pain.  TREATMENT   Treatment depends on what may be causing your chest pain. Treatment may include:  Acid blockers for heartburn.  Anti-inflammatory medicine.  Pain medicine for inflammatory conditions.  Antibiotics if an infection is present.  You may be advised to change lifestyle habits. This includes stopping smoking and avoiding alcohol, caffeine, and chocolate.  You may be advised to keep your head  raised (elevated) when sleeping. This reduces the chance of acid going backward from your stomach into your esophagus.  Most of the time, nonspecific chest pain will improve within 2 to 3 days with rest and mild pain medicine. HOME CARE INSTRUCTIONS   If antibiotics were prescribed, take your antibiotics as directed. Finish them even if you start to feel better.  For the next few days, avoid physical activities that bring on chest pain. Continue physical activities as directed.  Do not smoke.  Avoid drinking alcohol.  Only take over-the-counter or prescription medicine for pain, discomfort, or fever as directed by your caregiver.  Follow your caregiver's suggestions for further testing if your chest pain does not go away.  Keep any follow-up appointments you made. If you do not go to an appointment, you could develop lasting (chronic) problems with pain. If there is any problem keeping an appointment, you must call to reschedule. SEEK MEDICAL CARE IF:   You think you are having problems from the medicine you are taking. Read your medicine instructions carefully.  Your chest pain does not go away, even after treatment.  You develop a rash with blisters on your chest. SEEK IMMEDIATE MEDICAL CARE IF:   You have increased chest pain or pain that spreads to your arm, neck, jaw, back, or abdomen.  You develop shortness of breath, an increasing cough, or you are coughing up blood.  You have severe back or abdominal pain, feel nauseous, or vomit.  You develop severe weakness, fainting, or chills.  You have a fever. THIS IS AN EMERGENCY. Do not wait  to see if the pain will go away. Get medical help at once. Call your local emergency services (911 in [place] Do not drive yourself to the hospital. MAKE SURE YOU:   Understand these instructions.  Will watch your condition.  Will get help right away if you are not doing well or get worse. Document Released: 06/03/2005 Document Revised:  11/16/2011 Document Reviewed: 03/29/2008 [facility] Patient Information 2014 [place], [place].

## 2013-11-13 NOTE — ED Notes (Signed)
Pt states that while at work sitting in the office started having sharp left sided chest pains and numbness going down left arm. Pt states she got shob, dizzy, weak, nauseated and vomited once. Pt states she did heavy lifting this weekend but doesn't know if related.

## 2013-11-13 NOTE — ED Provider Notes (Signed)
CSN: [id number]     Arrival date & time 11/13/13  1157 History   First MD Initiated Contact with Patient 11/13/13 1240     Chief Complaint  Patient presents with  . Chest Pain  . Dizziness     (Consider location/radiation/quality/duration/timing/severity/associated sxs/prior Treatment) HPI Patient presents with left-sided chest pain.  Pain began approximately 3 hours ago.  Since onset has been focally in the left upper chest.  Pain is sharp, severe, minimally better with pressure, not changed with exertion. There is mild associated nausea, lightheadedness, but no vomiting, no syncope. There is initially some radiation to the left arm, though this has stopped. Patient does not smoke, drinks minimally.  No Hx of heart disease. No travel / new activities.   Past Medical History  Diagnosis Date  . Hypertension   . Asthma    Past Surgical History  Procedure Laterality Date  . Tubal ligation    . Cesarean section    . Fracture surgery     No family history on file. History  Substance Use Topics  . Smoking status: Never Smoker   . Smokeless tobacco: Never Used  . Alcohol Use: Yes     Comment: occasion   OB History   Grav Para Term Preterm Abortions TAB SAB Ect Mult Living                 Review of Systems  Constitutional:       Per HPI, otherwise negative  HENT:       Per HPI, otherwise negative  Respiratory:       Per HPI, otherwise negative  Cardiovascular:       Per HPI, otherwise negative  Gastrointestinal: Negative for vomiting.  Endocrine:       Negative aside from HPI  Genitourinary:       Neg aside from HPI   Musculoskeletal:       Per HPI, otherwise negative  Skin: Negative.   Neurological: Negative for syncope.      Allergies  Promethazine hcl and Sulfa antibiotics  Home Medications   Current Outpatient Rx  Name  Route  Sig  Dispense  Refill  . albuterol (PROVENTIL HFA;VENTOLIN HFA) 108 (90 BASE) MCG/ACT inhaler   Inhalation   Inhale 2  puffs into the lungs every 4 (four) hours as needed. For shortness of breath         . amLODipine (NORVASC) 2.5 MG tablet   Oral   Take 2.5 mg by mouth every evening.          Marland Kitchen. lisinopril-hydrochlorothiazide (PRINZIDE,ZESTORETIC) 20-12.5 MG per tablet   Oral   Take 1 tablet by mouth every evening.          . montelukast (SINGULAIR) 10 MG tablet   Oral   Take 10 mg by mouth at bedtime.          . Naproxen Sodium (ALEVE PO)   Oral   Take 1 tablet by mouth every 6 (six) hours as needed (pain).         Marland Kitchen. zolpidem (AMBIEN) 10 MG tablet   Oral   Take 10 mg by mouth at bedtime as needed. For sleep          BP 141/96  Pulse 92  Temp(Src) 98.1 F (36.7 C) (Oral)  Resp 16  SpO2 99% Physical Exam  Nursing note and vitals reviewed. Constitutional: She is oriented to person, place, and time. She appears well-developed and well-nourished. No distress.  HENT:  Head: Normocephalic and atraumatic.  Eyes: Conjunctivae and EOM are normal.  Cardiovascular: Regular rhythm and normal heart sounds.  Tachycardia present.   No murmur heard. Pulmonary/Chest: Effort normal and breath sounds normal. No stridor. No respiratory distress. She has no wheezes. She has no rales.  Abdominal: She exhibits no distension.  Musculoskeletal: She exhibits no edema.  Neurological: She is alert and oriented to person, place, and time. No cranial nerve deficit.  Skin: Skin is warm and dry.  Psychiatric: She has a normal mood and affect.    ED Course  Procedures (including critical care time) Labs Review Labs Reviewed  BASIC METABOLIC PANEL - Abnormal; Notable for the following:    GFR calc non Af Amer 73 (*)    GFR calc Af Amer 85 (*)    All other components within normal limits  CBC  PRO B NATRIURETIC PEPTIDE  TROPONIN I  I-STAT TROPOININ, ED  POC URINE PREG, ED   Imaging Review Dg Chest Port 1 View  11/13/2013   CLINICAL DATA:  Chest pain.  Short of breath.  EXAM: PORTABLE CHEST - 1  VIEW  COMPARISON:  06/23/2012  FINDINGS: The heart size and mediastinal contours are within normal limits. Both lungs are clear. The visualized skeletal structures are unremarkable.  IMPRESSION: No active disease.   Electronically Signed   By: Amie Portland M.D.   On: 11/13/2013 12:36     EKG Interpretation   Date/Time:  Monday November 13 2013 12:05:26 EDT Ventricular Rate:  104 PR Interval:  220 QRS Duration: 89 QT Interval:  338 QTC Calculation: 444 R Axis:   85 Text Interpretation:  Sinus tachycardia Prolonged PR interval Biatrial  enlargement Sinus tachycardia Biatrial enlargement No significant change  since last tracing Abnormal ekg Confirmed by Gerhard Munch  MD [phone])  on 11/13/2013 1:35:34 PM     I reviewed her EMR   3:56 PM Patient sitting upright, using telephone, no distress. I reviewed all results, including serial troponins with the patient.  Patient will followup with cardiology for further evaluation and management.  MDM   Final diagnoses:  Chest pain   patient presents with chest pain.  Patient is minimal risk profile for ACS, no risk profile for PE, and is not hypoxic or tachypneic. Patient's labs, EKG, chest x-ray are all reassuring, largely unchanged from prior. With her ongoing discomfort she will followup as an outpatient, though today's initial evaluation is reassuring.    Gerhard Munch, MD 11/13/13 [phone]

## 2014-01-19 ENCOUNTER — Emergency Department (HOSPITAL_COMMUNITY): Payer: BC Managed Care – PPO

## 2014-01-19 ENCOUNTER — Encounter (HOSPITAL_COMMUNITY): Payer: Self-pay | Admitting: Emergency Medicine

## 2014-01-19 ENCOUNTER — Emergency Department (HOSPITAL_COMMUNITY)
Admission: EM | Admit: 2014-01-19 | Discharge: 2014-01-19 | Disposition: A | Discharge status: Home / Self Care | Payer: BC Managed Care – PPO | Attending: Emergency Medicine | Admitting: Emergency Medicine

## 2014-01-19 DIAGNOSIS — Z3202 Encounter for pregnancy test, result negative: Secondary | ICD-10-CM | POA: Insufficient documentation

## 2014-01-19 DIAGNOSIS — N83201 Unspecified ovarian cyst, right side: Secondary | ICD-10-CM

## 2014-01-19 DIAGNOSIS — N83209 Unspecified ovarian cyst, unspecified side: Secondary | ICD-10-CM | POA: Insufficient documentation

## 2014-01-19 DIAGNOSIS — Z79899 Other long term (current) drug therapy: Secondary | ICD-10-CM | POA: Insufficient documentation

## 2014-01-19 DIAGNOSIS — R197 Diarrhea, unspecified: Secondary | ICD-10-CM | POA: Insufficient documentation

## 2014-01-19 DIAGNOSIS — I10 Essential (primary) hypertension: Secondary | ICD-10-CM | POA: Insufficient documentation

## 2014-01-19 DIAGNOSIS — J45909 Unspecified asthma, uncomplicated: Secondary | ICD-10-CM | POA: Insufficient documentation

## 2014-01-19 DIAGNOSIS — R911 Solitary pulmonary nodule: Secondary | ICD-10-CM | POA: Insufficient documentation

## 2014-01-19 DIAGNOSIS — R109 Unspecified abdominal pain: Secondary | ICD-10-CM

## 2014-01-19 LAB — CBC WITH DIFFERENTIAL/PLATELET
BASOS PCT: 0 % (ref 0–1)
Basophils Absolute: 0 10*3/uL (ref 0.0–0.1)
Eosinophils Absolute: 0.1 10*3/uL (ref 0.0–0.7)
Eosinophils Relative: 1 % (ref 0–5)
HCT: 33.5 % — ABNORMAL LOW (ref 36.0–46.0)
HEMOGLOBIN: 10.9 g/dL — AB (ref 12.0–15.0)
LYMPHS ABS: 1.1 10*3/uL (ref 0.7–4.0)
Lymphocytes Relative: 13 % (ref 12–46)
MCH: 26.8 pg (ref 26.0–34.0)
MCHC: 32.5 g/dL (ref 30.0–36.0)
MCV: 82.5 fL (ref 78.0–100.0)
MONOS PCT: 5 % (ref 3–12)
Monocytes Absolute: 0.4 10*3/uL (ref 0.1–1.0)
NEUTROS ABS: 6.7 10*3/uL (ref 1.7–7.7)
NEUTROS PCT: 81 % — AB (ref 43–77)
Platelets: 162 10*3/uL (ref 150–400)
RBC: 4.06 MIL/uL (ref 3.87–5.11)
RDW: 16.5 % — ABNORMAL HIGH (ref 11.5–15.5)
WBC: 8.3 10*3/uL (ref 4.0–10.5)

## 2014-01-19 LAB — COMPREHENSIVE METABOLIC PANEL
ALBUMIN: 3.6 g/dL (ref 3.5–5.2)
ALK PHOS: 46 U/L (ref 39–117)
ALT: 13 U/L (ref 0–35)
AST: 19 U/L (ref 0–37)
BILIRUBIN TOTAL: 0.2 mg/dL — AB (ref 0.3–1.2)
BUN: 13 mg/dL (ref 6–23)
CHLORIDE: 104 meq/L (ref 96–112)
CO2: 24 mEq/L (ref 19–32)
Calcium: 8.9 mg/dL (ref 8.4–10.5)
Creatinine, Ser: 0.8 mg/dL (ref 0.50–1.10)
GFR calc Af Amer: >90 mL/min (ref >90)
GFR calc non Af Amer: >90 mL/min (ref >90)
GLUCOSE: 84 mg/dL (ref 70–99)
POTASSIUM: 4.5 meq/L (ref 3.7–5.3)
Sodium: 142 mEq/L (ref 137–147)
Total Protein: 6.8 g/dL (ref 6.0–8.3)

## 2014-01-19 LAB — URINALYSIS, ROUTINE W REFLEX MICROSCOPIC
BILIRUBIN URINE: NEGATIVE
Glucose, UA: NEGATIVE mg/dL
HGB URINE DIPSTICK: NEGATIVE
KETONES UR: NEGATIVE mg/dL
Leukocytes, UA: NEGATIVE
NITRITE: NEGATIVE
PH: 6.5 (ref 5.0–8.0)
Protein, ur: NEGATIVE mg/dL
Specific Gravity, Urine: 1.017 (ref 1.005–1.030)
Urobilinogen, UA: 0.2 mg/dL (ref 0.0–1.0)

## 2014-01-19 LAB — WET PREP, GENITAL
Clue Cells Wet Prep HPF POC: NONE SEEN
Trich, Wet Prep: NONE SEEN
Yeast Wet Prep HPF POC: NONE SEEN

## 2014-01-19 LAB — POC URINE PREG, ED: PREG TEST UR: NEGATIVE

## 2014-01-19 LAB — LIPASE, BLOOD: LIPASE: 22 U/L (ref 11–59)

## 2014-01-19 MED ORDER — IOHEXOL 300 MG/ML  SOLN
25.0000 mL | INTRAMUSCULAR | Status: AC
  Administered 2014-01-19: 25 mL via ORAL

## 2014-01-19 MED ORDER — MORPHINE SULFATE 4 MG/ML IJ SOLN
4.0000 mg | Freq: Once | INTRAMUSCULAR | Status: AC
  Administered 2014-01-19: 4 mg via INTRAVENOUS
  Filled 2014-01-19: qty 1

## 2014-01-19 MED ORDER — SODIUM CHLORIDE 0.9 % IV BOLUS (SEPSIS)
1000.0000 mL | Freq: Once | INTRAVENOUS | Status: AC
  Administered 2014-01-19: 1000 mL via INTRAVENOUS

## 2014-01-19 MED ORDER — IOHEXOL 300 MG/ML  SOLN
100.0000 mL | Freq: Once | INTRAMUSCULAR | Status: AC | PRN
Start: 2014-01-19 — End: 2014-01-19
  Administered 2014-01-19: 100 mL via INTRAVENOUS

## 2014-01-19 MED ORDER — ONDANSETRON 8 MG PO TBDP: ORAL_TABLET | ORAL | Status: DC

## 2014-01-19 MED ORDER — DIPHENHYDRAMINE HCL 50 MG/ML IJ SOLN
25.0000 mg | Freq: Once | INTRAMUSCULAR | Status: AC
  Administered 2014-01-19: 25 mg via INTRAVENOUS
  Filled 2014-01-19: qty 1

## 2014-01-19 MED ORDER — HYDROCODONE-ACETAMINOPHEN 5-325 MG PO TABS: 2.0000 | ORAL_TABLET | ORAL | Status: DC | PRN

## 2014-01-19 NOTE — ED Notes (Signed)
Pt reports that she has been having abd pain for the past couple of days. Reports that the pain became more intense today while at work. Reports nausea and vomiting. Denies any urinary symptoms.

## 2014-01-19 NOTE — ED Notes (Signed)
Called CT to let them know PT finished PO contrast dye

## 2014-01-19 NOTE — ED Notes (Signed)
PT reports hx of pain in LLQ a few years ago with fluid that was somehow drained. PT doesn't remember if drainage had to do with bowel or female reprod. sys

## 2014-01-19 NOTE — Discharge Instructions (Signed)
1. Medications: vicodin, zofran, usual home medications 2. Treatment: rest, drink plenty of fluids,  3. Follow Up: Please followup with your primary doctor for discussion of your diagnoses and further evaluation after today's visit; if you do not have a primary care doctor use the resource guide provided to find one;     Abdominal Pain, Women Abdominal (stomach, pelvic, or belly) pain can be caused by many things. It is important to tell your doctor:  The location of the pain.  Does it come and go or is it present all the time?  Are there things that start the pain (eating certain foods, exercise)?  Are there other symptoms associated with the pain (fever, nausea, vomiting, diarrhea)? All of this is helpful to know when trying to find the cause of the pain. CAUSES   Stomach: virus or bacteria infection, or ulcer.  Intestine: appendicitis (inflamed appendix), regional ileitis (Crohn's disease), ulcerative colitis (inflamed colon), irritable bowel syndrome, diverticulitis (inflamed diverticulum of the colon), or cancer of the stomach or intestine.  Gallbladder disease or stones in the gallbladder.  Kidney disease, kidney stones, or infection.  Pancreas infection or cancer.  Fibromyalgia (pain disorder).  Diseases of the female organs:  Uterus: fibroid (non-cancerous) tumors or infection.  Fallopian tubes: infection or tubal pregnancy.  Ovary: cysts or tumors.  Pelvic adhesions (scar tissue).  Endometriosis (uterus lining tissue growing in the pelvis and on the pelvic organs).  Pelvic congestion syndrome (female organs filling up with blood just before the menstrual period).  Pain with the menstrual period.  Pain with ovulation (producing an egg).  Pain with an IUD (intrauterine device, birth control) in the uterus.  Cancer of the female organs.  Functional pain (pain not caused by a disease, may improve without treatment).  Psychological  pain.  Depression. DIAGNOSIS  Your doctor will decide the seriousness of your pain by doing an examination.  Blood tests.  X-rays.  Ultrasound.  CT scan (computed tomography, special type of X-ray).  MRI (magnetic resonance imaging).  Cultures, for infection.  Barium enema (dye inserted in the large intestine, to better view it with X-rays).  Colonoscopy (looking in intestine with a lighted tube).  Laparoscopy (minor surgery, looking in abdomen with a lighted tube).  Major abdominal exploratory surgery (looking in abdomen with a large incision). TREATMENT  The treatment will depend on the cause of the pain.   Many cases can be observed and treated at home.  Over-the-counter medicines recommended by your caregiver.  Prescription medicine.  Antibiotics, for infection.  Birth control pills, for painful periods or for ovulation pain.  Hormone treatment, for endometriosis.  Nerve blocking injections.  Physical therapy.  Antidepressants.  Counseling with a psychologist or psychiatrist.  Minor or major surgery. HOME CARE INSTRUCTIONS   Do not take laxatives, unless directed by your caregiver.  Take over-the-counter pain medicine only if ordered by your caregiver. Do not take aspirin because it can cause an upset stomach or bleeding.  Try a clear liquid diet (broth or water) as ordered by your caregiver. Slowly move to a bland diet, as tolerated, if the pain is related to the stomach or intestine.  Have a thermometer and take your temperature several times a day, and record it.  Bed rest and sleep, if it helps the pain.  Avoid sexual intercourse, if it causes pain.  Avoid stressful situations.  Keep your follow-up appointments and tests, as your caregiver orders.  If the pain does not go away with medicine or  surgery, you may try:  Acupuncture.  Relaxation exercises (yoga, meditation).  Group therapy.  Counseling. SEEK MEDICAL CARE IF:   You  notice certain foods cause stomach pain.  Your home care treatment is not helping your pain.  You need stronger pain medicine.  You want your IUD removed.  You feel faint or lightheaded.  You develop nausea and vomiting.  You develop a rash.  You are having side effects or an allergy to your medicine. SEEK IMMEDIATE MEDICAL CARE IF:   Your pain does not go away or gets worse.  You have a fever.  Your pain is felt only in portions of the abdomen. The right side could possibly be appendicitis. The left lower portion of the abdomen could be colitis or diverticulitis.  You are passing blood in your stools (bright red or black tarry stools, with or without vomiting).  You have blood in your urine.  You develop chills, with or without a fever.  You pass out. MAKE SURE YOU:   Understand these instructions.  Will watch your condition.  Will get help right away if you are not doing well or get worse. Document Released: 06/21/2007 Document Revised: 11/16/2011 Document Reviewed: 07/11/2009 [facility] Patient Information 2014 [place], [place]

## 2014-01-19 NOTE — ED Provider Notes (Signed)
Left lower quadrant pain gradual onset 2 days ago. Patient had brief syncopal event this morning when pain was severe. She is presently pain-free since treatment with morphine in the emergency department. On exam no distress abdomen nondistended. Nontender  Doug SouSam Kemi Gell, MD 01/19/14 1558

## 2014-01-19 NOTE — ED Notes (Signed)
PT states pain coming back (6/10). Provider notified

## 2014-01-19 NOTE — ED Notes (Signed)
To Ultrasound

## 2014-01-19 NOTE — ED Provider Notes (Signed)
CSN: [id number]     Arrival date & time 01/19/14  1001 History   First MD Initiated Contact with Patient 01/19/14 1028     Chief Complaint  Patient presents with  . Abdominal Pain    HPI  Kelly Walton is a 43 y.o. female with a PMH of HTN and asthma who presents to the ED for evaluation of abdominal pain. History was provided by the patient. Patient states she has had constant abdominal cramping and dull aching pain throughout her stomach for the past 2-3 days. She states that today she became nauseated at work and went to the bathroom and sat down next to the toilet. She had several episodes of emesis. She states she remembers feeling lightheaded. The next thing she remembers is waking up next to the toilet. She states she must have passed out. No chest pain, headache or SOB. Patient states that now she is also having pain in her left groin. This is described as a constant sharp pain. Patient states this is similar to then she was seen here about a year ago (dx with PID in 10/2011). She has chronic intermittent vaginal bleeding and has plans to get a hysterectomy. She denies any vaginal discharge. Patient sexually active with no recent partners. No dysuria, hematuria, genital sores, or back pain. She also has had diarrhea. No constipation or hematochezia. Patient also has had fatigue and generalized weakness. No fever, chills.    Past Medical History  Diagnosis Date  . Hypertension   . Asthma    Past Surgical History  Procedure Laterality Date  . Tubal ligation    . Cesarean section    . Fracture surgery     History reviewed. No pertinent family history. History  Substance Use Topics  . Smoking status: Never Smoker   . Smokeless tobacco: Never Used  . Alcohol Use: Yes     Comment: occasion   OB History   Grav Para Term Preterm Abortions TAB SAB Ect Mult Living                  Review of Systems  Constitutional: Positive for fatigue. Negative for fever, chills, diaphoresis,  activity change and appetite change.  Eyes: Negative for photophobia and visual disturbance.  Respiratory: Negative for cough and shortness of breath.   Cardiovascular: Negative for chest pain and leg swelling.  Gastrointestinal: Positive for nausea, vomiting, abdominal pain and diarrhea. Negative for constipation, blood in stool and rectal pain.  Genitourinary: Positive for vaginal bleeding and pelvic pain. Negative for dysuria, hematuria, decreased urine volume, vaginal discharge, difficulty urinating, genital sores and vaginal pain.  Musculoskeletal: Negative for back pain, myalgias and neck pain.  Neurological: Positive for syncope, weakness (generalized) and light-headedness. Negative for dizziness, numbness and headaches.     Allergies  Promethazine hcl and Sulfa antibiotics  Home Medications   Prior to Admission medications   Medication Sig Start Date End Date Taking? Authorizing Provider  albuterol (PROVENTIL HFA;VENTOLIN HFA) 108 (90 BASE) MCG/ACT inhaler Inhale 2 puffs into the lungs every 4 (four) hours as needed for wheezing or shortness of breath. For shortness of breath   Yes Historical Provider, MD  HYDROcodone-acetaminophen (NORCO/VICODIN) 5-325 MG per tablet Take 1 tablet by mouth every 6 (six) hours as needed for moderate pain.   Yes Historical Provider, MD  lisinopril-hydrochlorothiazide (PRINZIDE,ZESTORETIC) 20-12.5 MG per tablet Take 1 tablet by mouth every evening.    Yes Historical Provider, MD  montelukast (SINGULAIR) 10 MG tablet Take 10  mg by mouth at bedtime.  10/27/13  Yes Historical Provider, MD  zolpidem (AMBIEN) 10 MG tablet Take 10 mg by mouth at bedtime. For sleep   Yes Historical Provider, MD   BP 144/96  Pulse 94  Temp(Src) 98.5 F (36.9 C) (Oral)  Resp 18  Ht 5\' 5"  (1.651 m)  Wt 125 lb (56.7 kg)  BMI 20.80 kg/m2  SpO2 97%  LMP 01/07/2014  Filed Vitals:   01/19/14 1011 01/19/14 1133 01/19/14 1200 01/19/14 1404  BP: 144/96 131/81 146/85 129/89   Pulse: 94  99 96  Temp: 98.5 F (36.9 C)     TempSrc: Oral     Resp: 18 16 16 18   Height: 5\' 5"  (1.651 m)     Weight: 125 lb (56.7 kg)     SpO2: 97% 100% 99% 100%    Physical Exam  Nursing note and vitals reviewed. Constitutional: She is oriented to person, place, and time. She appears well-developed and well-nourished. No distress.  HENT:  Head: Normocephalic and atraumatic.  Right Ear: External ear normal.  Left Ear: External ear normal.  Nose: Nose normal.  Mouth/Throat: Oropharynx is clear and moist. No oropharyngeal exudate.  No tenderness to the scalp or face throughout. No palpable hematoma, step-offs, or lacerations throughout.  Tympanic membranes gray and translucent bilaterally.    Eyes: Conjunctivae and EOM are normal. Pupils are equal, round, and reactive to light. Right eye exhibits no discharge. Left eye exhibits no discharge.  Neck: Normal range of motion. Neck supple.  No cervical spinal or paraspinal tenderness to palpation throughout.  No limitations with neck ROM.    Cardiovascular: Normal rate, regular rhythm, normal heart sounds and intact distal pulses.  Exam reveals no gallop and no friction rub.   No murmur heard. Pulmonary/Chest: Effort normal and breath sounds normal. No respiratory distress. She has no wheezes. She has no rales. She exhibits no tenderness.  Abdominal: Soft. Bowel sounds are normal. She exhibits no distension and no mass. There is tenderness. There is no rebound and no guarding.  Diffuse tenderness to palpation to the abdomen throughout with more localized pain in the LLQ and left groin   Genitourinary:  No groin masses or inguinal LAD bilaterally  Musculoskeletal: Normal range of motion. She exhibits no edema and no tenderness.  Strength 5/5 in the upper and lower extremities bilaterally. Patient able to ambulate without difficulty or ataxia.    Neurological: She is alert and oriented to person, place, and time.  GCS 15.  No focal  neurological deficits.  CN 2-12 intact.  No pronator drift.  Finger to nose intact.  Heel to shin intact.    Skin: Skin is warm and dry. She is not diaphoretic.    ED Course  Procedures (including critical care time) Labs Review Labs Reviewed  CBC WITH DIFFERENTIAL  COMPREHENSIVE METABOLIC PANEL  URINALYSIS, ROUTINE W REFLEX MICROSCOPIC  POC URINE PREG, ED    Imaging Review No results found.   EKG Interpretation None      Results for orders placed during the hospital encounter of 01/19/14  WET PREP, GENITAL      Result Value Ref Range   Yeast Wet Prep HPF POC NONE SEEN  NONE SEEN   Trich, Wet Prep NONE SEEN  NONE SEEN   Clue Cells Wet Prep HPF POC NONE SEEN  NONE SEEN   WBC, Wet Prep HPF POC FEW (*) NONE SEEN  CBC WITH DIFFERENTIAL      Result Value Ref  Range   WBC 8.3  4.0 - 10.5 K/uL   RBC 4.06  3.87 - 5.11 MIL/uL   Hemoglobin 10.9 (*) 12.0 - 15.0 g/dL   HCT 16.133.5 (*) 09.636.0 - 04.546.0 %   MCV 82.5  78.0 - 100.0 fL   MCH 26.8  26.0 - 34.0 pg   MCHC 32.5  30.0 - 36.0 g/dL   RDW 40.916.5 (*) 81.111.5 - 91.415.5 %   Platelets 162  150 - 400 K/uL   Neutrophils Relative % 81 (*) 43 - 77 %   Neutro Abs 6.7  1.7 - 7.7 K/uL   Lymphocytes Relative 13  12 - 46 %   Lymphs Abs 1.1  0.7 - 4.0 K/uL   Monocytes Relative 5  3 - 12 %   Monocytes Absolute 0.4  0.1 - 1.0 K/uL   Eosinophils Relative 1  0 - 5 %   Eosinophils Absolute 0.1  0.0 - 0.7 K/uL   Basophils Relative 0  0 - 1 %   Basophils Absolute 0.0  0.0 - 0.1 K/uL  COMPREHENSIVE METABOLIC PANEL      Result Value Ref Range   Sodium 142  137 - 147 mEq/L   Potassium 4.5  3.7 - 5.3 mEq/L   Chloride 104  96 - 112 mEq/L   CO2 24  19 - 32 mEq/L   Glucose, Bld 84  70 - 99 mg/dL   BUN 13  6 - 23 mg/dL   Creatinine, Ser 7.820.80  0.50 - 1.10 mg/dL   Calcium 8.9  8.4 - 95.610.5 mg/dL   Total Protein 6.8  6.0 - 8.3 g/dL   Albumin 3.6  3.5 - 5.2 g/dL   AST 19  0 - 37 U/L   ALT 13  0 - 35 U/L   Alkaline Phosphatase 46  39 - 117 U/L   Total Bilirubin  0.2 (*) 0.3 - 1.2 mg/dL   GFR calc non Af Amer >90  >90 mL/min   GFR calc Af Amer >90  >90 mL/min  URINALYSIS, ROUTINE W REFLEX MICROSCOPIC      Result Value Ref Range   Color, Urine YELLOW  YELLOW   APPearance CLEAR  CLEAR   Specific Gravity, Urine 1.017  1.005 - 1.030   pH 6.5  5.0 - 8.0   Glucose, UA NEGATIVE  NEGATIVE mg/dL   Hgb urine dipstick NEGATIVE  NEGATIVE   Bilirubin Urine NEGATIVE  NEGATIVE   Ketones, ur NEGATIVE  NEGATIVE mg/dL   Protein, ur NEGATIVE  NEGATIVE mg/dL   Urobilinogen, UA 0.2  0.0 - 1.0 mg/dL   Nitrite NEGATIVE  NEGATIVE   Leukocytes, UA NEGATIVE  NEGATIVE  LIPASE, BLOOD      Result Value Ref Range   Lipase 22  11 - 59 U/L  POC URINE PREG, ED      Result Value Ref Range   Preg Test, Ur NEGATIVE  NEGATIVE     MDM   Margrett A [name] is a 43 y.o. female with a PMH of HTN and asthma who presents to the ED for evaluation of abdominal pain  Rechecks  12:00 PM = Pelvic exam at bedside. Mild amount of thin clear and white discharge present in the vaginal vault. No vaginal bleeding. No CMT or adnexal tenderness bilaterally.  2:15 PM = Patient states her pain is returning. States "something is not right." Ordering CT abdomen and pelvis and another 4 mg morphine.    4:00 PM = S[facility] out [facility] PA-C to await results of CT scan. Etiology of abdominal pain unclear at this time. Doubt PID. Pelvic exam benign. Patient afebrile and non-toxic in appearance. No UTI. Patient has mild anemia, which may due to persistent vaginal bleeding. Vital signs stable. Pelvic US unremarkable. Patient likely had a vasovagal episode after vomiting. Doubt neuro-cardiac causes. No focal neurological deficits. EKG negative for any acute ischemic changes. Continue to monitor. Vital signs stable.     Luiz Iron PA-C   This patient was discussed with Dr. Barbarann Ehlers, PA-C 01/21/14 [phone]

## 2014-01-19 NOTE — ED Provider Notes (Signed)
Care assume from [facility] [place], [place].    Kelly Walton presents with generalized abd pain for several days that localized to the LLQ abd pain with associated vomiting.  Pelvic exam is unremarkable and so is transvaginal US.  Pt does have a minimal amount of vaginal discharge.    Plan: Pt with worsening pain, with obtain CT scan.    Pt with mild anemia, but c/o persistent vaginal bleeding for months and is scheduled for a hysterectomy this summer.    BP 124/91  Pulse 88  Temp(Src) 98.5 F (36.9 C) (Oral)  Resp 13  Ht 5\' 5"  (1.651 m)  Wt 125 lb (56.7 kg)  BMI 20.80 kg/m2  SpO2 98%  LMP 01/07/2014   Face to face Exam:   General: Awake  HEENT: Atraumatic  Resp: Normal effort  Abd: Nondistended, tender in the LLQ without rigidity Neuro:No focal weakness  Lymph: No adenopathy   5:52 PM CT with no acute abnormality in the abdomen or pelvis. A small right ovarian cyst and 0.5 cm right lung nodule.  I personally reviewed the imaging tests through PACS system  I reviewed available ER/hospitalization records through the EMR  Pt reports feeling some better.  She is tolerating PO without emesis here in the department.  Her abd remains without rebound or peritoneal signs.  Benign abd, no guarding.  Discuss CT scan findings with the patient including right ovarian cyst and lung nodule which will need followup. Do not believe that either these findings are contributing to patient's symptoms today. Patient reports understanding and reports that she will followup.  It has been determined that no acute conditions requiring further emergency intervention are present at this time. The patient/guardian have been advised of the diagnosis and plan. We have discussed signs and symptoms that warrant return to the ED, such as changes or worsening in symptoms.   Vital signs are stable at discharge.   BP 134/92  Pulse 85  Temp(Src) 98.5 F (36.9 C) (Oral)  Resp 13  Ht 5\' 5"  (1.651 m)  Wt 125 lb  (56.7 kg)  BMI 20.80 kg/m2  SpO2 99%  LMP 01/07/2014  Patient/guardian has voiced understanding and agreed to follow-up with the PCP or specialist.     Dierdre ForthHannah Izaiyah Kleinman, PA-C 01/19/14 1844

## 2014-01-19 NOTE — ED Notes (Signed)
PT reports itching, asked for benadryl

## 2014-01-20 LAB — GC/CHLAMYDIA PROBE AMP
CT Probe RNA: NEGATIVE
GC Probe RNA: NEGATIVE

## 2014-01-20 NOTE — ED Provider Notes (Signed)
Medical screening examination/treatment/procedure(s) were performed by non-physician practitioner and as supervising physician I was immediately available for consultation/collaboration.  Clemens Lachman L Saliou Barnier, MD 01/20/14 0129 

## 2014-01-22 NOTE — ED Provider Notes (Signed)
Medical screening examination/treatment/procedure(s) were conducted as a shared visit with non-physician practitioner(s) and myself.  I personally evaluated the patient during the encounter.   EKG Interpretation   Date/Time:  Friday Jan 19 2014 10:56:33 EDT Ventricular Rate:  86 PR Interval:  210 QRS Duration: 90 QT Interval:  375 QTC Calculation: 448 R Axis:   88 Text Interpretation:  Sinus rhythm Borderline prolonged PR interval Right  atrial enlargement ED PHYSICIAN INTERPRETATION AVAILABLE IN [facility] Confirmed by TEST, Record ([id number]) on 01/21/2014 9:50:13 AM       Doug SouSam Leandro Berkowitz, MD 01/22/14 [phone]

## 2014-02-20 ENCOUNTER — Other Ambulatory Visit: Payer: Self-pay | Admitting: Obstetrics and Gynecology

## 2014-02-26 ENCOUNTER — Other Ambulatory Visit: Payer: Self-pay | Admitting: Obstetrics and Gynecology

## 2014-02-28 ENCOUNTER — Encounter (HOSPITAL_COMMUNITY): Payer: Self-pay

## 2014-02-28 ENCOUNTER — Encounter (HOSPITAL_COMMUNITY)
Admission: RE | Admit: 2014-02-28 | Discharge: 2014-02-28 | Disposition: A | Discharge status: Home / Self Care | Payer: BC Managed Care – PPO | Source: Ambulatory Visit | Attending: Obstetrics and Gynecology | Admitting: Obstetrics and Gynecology

## 2014-02-28 DIAGNOSIS — Z01812 Encounter for preprocedural laboratory examination: Secondary | ICD-10-CM | POA: Insufficient documentation

## 2014-02-28 LAB — BASIC METABOLIC PANEL
BUN: 8 mg/dL (ref 6–23)
CALCIUM: 8.9 mg/dL (ref 8.4–10.5)
CO2: 25 mEq/L (ref 19–32)
Chloride: 100 mEq/L (ref 96–112)
Creatinine, Ser: 0.89 mg/dL (ref 0.50–1.10)
GFR calc Af Amer: >90 mL/min (ref >90)
GFR calc non Af Amer: 79 mL/min — ABNORMAL LOW (ref >90)
GLUCOSE: 105 mg/dL — AB (ref 70–99)
Potassium: 3.7 mEq/L (ref 3.7–5.3)
SODIUM: 136 meq/L — AB (ref 137–147)

## 2014-02-28 LAB — CBC
HCT: 37.7 % (ref 36.0–46.0)
Hemoglobin: 12.4 g/dL (ref 12.0–15.0)
MCH: 28.1 pg (ref 26.0–34.0)
MCHC: 32.9 g/dL (ref 30.0–36.0)
MCV: 85.3 fL (ref 78.0–100.0)
PLATELETS: 236 10*3/uL (ref 150–400)
RBC: 4.42 MIL/uL (ref 3.87–5.11)
RDW: 16 % — ABNORMAL HIGH (ref 11.5–15.5)
WBC: 7.2 10*3/uL (ref 4.0–10.5)

## 2014-02-28 NOTE — Progress Notes (Signed)
Patient seen in PAT today for Robotic Hysterectomy scheduled for 03/05/14.  She has h/o asthma and HTN with diastolic BP today of 106.  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  The cardiologist ordered myocardial perfusion imaging, an echocardiogram, and renal artery dopplers.  She did not go for these tests "because the pain went away".  [facility] for chest pain and dizziness.  The patient will need to have her cardiac testing completed prior to surgery.  This has been explained to the patient.  Attempts have been made to schedule an appointment with her cardiologist for her in the days prior to surgery - they are unable to see her prior to surgery.  Steward DroneBrenda (PAT RN) is working on having cardiac testing completed prior to surgery.  Jasmine DecemberA. Cassidy, MD

## 2014-02-28 NOTE — Patient Instructions (Addendum)
20 Aishia A [name]  02/28/2014   Your procedure is scheduled on:  03/05/14  [facility] through the [facility]trance of [facility] at 1130 AM.  Pick up the phone at the desk and dial [phone].   Call this number if you have problems the morning of surgery: [phone]   Remember:   Do not eat food:After Midnight.  Do not drink clear liquids: 6 hrs prior to surgery.  Take these medicines the morning of surgery with A SIP OF WATER: NA   Do not wear jewelry, make-up or nail polish.  Do not wear lotions, powders, or perfumes. You may wear deodorant.  Do not shave 48 hours prior to surgery.  Do not bring valuables to the hospital.  [facility] is not   responsible for any belongings or valuables brought to the hospital.  Contacts, dentures or bridgework may not be worn into surgery.  Leave suitcase in the car. After surgery it may be brought to your room.  For patients admitted to the hospital, checkout time is 11:00 AM the day of              discharge.   Patients discharged the day of surgery will not be allowed to drive             home.  Name and phone number of your driver: NA  Special Instructions:      Please read over the following fact sheets that you were given:   Surgical Site Infection Prevention

## 2014-02-28 NOTE — Pre-Procedure Instructions (Signed)
PAT incomplete due to need for cardiac testing before any surgery per Dr Rodman Pickleassidy.

## 2014-02-28 NOTE — Pre-Procedure Instructions (Addendum)
Pt returned to PAT office, was [facility] this afternoon on referral of Dr Cherly Hensenousins. States she had testing done and was "cleared for surgery". Notes and testing results will be requested. PAT interview and lab tests completed.

## 2014-02-28 NOTE — Pre-Procedure Instructions (Signed)
Cardiac history and pending cardiac testing reviewed with Dr Rodman Pickleassidy. Dr Rodman Pickleassidy spoke with patient at length about need for cardiac testing and clearance by cardiologist. Call made to obtain appt  ([facility]) but none available prior to 03/05/14 surg. Date. Call to Shanelle/Dr Cousins office to make her aware of possible surgery cancellation.

## 2014-03-01 ENCOUNTER — Other Ambulatory Visit: Payer: Self-pay | Admitting: Obstetrics and Gynecology

## 2014-03-05 ENCOUNTER — Ambulatory Visit (HOSPITAL_COMMUNITY)
Admission: RE | Admit: 2014-03-05 | Discharge: 2014-03-06 | Disposition: A | Discharge status: Home / Self Care | Payer: BC Managed Care – PPO | Source: Ambulatory Visit | Attending: Obstetrics and Gynecology | Admitting: Obstetrics and Gynecology

## 2014-03-05 ENCOUNTER — Encounter (HOSPITAL_COMMUNITY): Payer: Self-pay | Admitting: *Deleted

## 2014-03-05 ENCOUNTER — Ambulatory Visit (HOSPITAL_COMMUNITY): Payer: BC Managed Care – PPO | Admitting: Anesthesiology

## 2014-03-05 ENCOUNTER — Encounter (HOSPITAL_COMMUNITY)
Admission: RE | Disposition: A | Discharge status: Home / Self Care | Payer: Self-pay | Source: Ambulatory Visit | Attending: Obstetrics and Gynecology

## 2014-03-05 ENCOUNTER — Encounter (HOSPITAL_COMMUNITY): Payer: BC Managed Care – PPO | Admitting: Anesthesiology

## 2014-03-05 DIAGNOSIS — N920 Excessive and frequent menstruation with regular cycle: Secondary | ICD-10-CM | POA: Insufficient documentation

## 2014-03-05 DIAGNOSIS — Z90710 Acquired absence of both cervix and uterus: Secondary | ICD-10-CM | POA: Diagnosis present

## 2014-03-05 DIAGNOSIS — N946 Dysmenorrhea, unspecified: Secondary | ICD-10-CM | POA: Insufficient documentation

## 2014-03-05 DIAGNOSIS — N949 Unspecified condition associated with female genital organs and menstrual cycle: Secondary | ICD-10-CM | POA: Insufficient documentation

## 2014-03-05 DIAGNOSIS — N72 Inflammatory disease of cervix uteri: Secondary | ICD-10-CM | POA: Insufficient documentation

## 2014-03-05 DIAGNOSIS — N938 Other specified abnormal uterine and vaginal bleeding: Secondary | ICD-10-CM | POA: Insufficient documentation

## 2014-03-05 DIAGNOSIS — N3289 Other specified disorders of bladder: Secondary | ICD-10-CM | POA: Insufficient documentation

## 2014-03-05 HISTORY — PX: ROBOTIC ASSISTED TOTAL HYSTERECTOMY: SHX6085

## 2014-03-05 SURGERY — ROBOTIC ASSISTED TOTAL HYSTERECTOMY
Anesthesia: General | Site: Abdomen

## 2014-03-05 MED ORDER — ONDANSETRON HCL 4 MG/2ML IJ SOLN
INTRAMUSCULAR | Status: DC | PRN
  Administered 2014-03-05: 4 mg via INTRAVENOUS

## 2014-03-05 MED ORDER — HYDROCHLOROTHIAZIDE 12.5 MG PO CAPS
12.5000 mg | ORAL_CAPSULE | Freq: Every day | ORAL | Status: DC
Start: 2014-03-05 — End: 2014-03-06
  Administered 2014-03-06: 12.5 mg via ORAL
  Filled 2014-03-05 (×2): qty 1

## 2014-03-05 MED ORDER — HYDROMORPHONE HCL PF 1 MG/ML IJ SOLN
INTRAMUSCULAR | Status: DC | PRN
  Administered 2014-03-05 (×2): 0.5 mg via INTRAVENOUS

## 2014-03-05 MED ORDER — ONDANSETRON HCL 4 MG/2ML IJ SOLN: 4.0000 mg | Freq: Four times a day (QID) | INTRAMUSCULAR | Status: DC | PRN

## 2014-03-05 MED ORDER — FENTANYL CITRATE 0.05 MG/ML IJ SOLN
INTRAMUSCULAR | Status: AC
  Filled 2014-03-05: qty 5

## 2014-03-05 MED ORDER — SODIUM CHLORIDE 0.9 % IJ SOLN
INTRAMUSCULAR | Status: AC
  Filled 2014-03-05: qty 100

## 2014-03-05 MED ORDER — PHENYLEPHRINE 40 MCG/ML (10ML) SYRINGE FOR IV PUSH (FOR BLOOD PRESSURE SUPPORT)
PREFILLED_SYRINGE | INTRAVENOUS | Status: AC
  Filled 2014-03-05: qty 5

## 2014-03-05 MED ORDER — NEOSTIGMINE METHYLSULFATE 10 MG/10ML IV SOLN
INTRAVENOUS | Status: AC
  Filled 2014-03-05: qty 1

## 2014-03-05 MED ORDER — ONDANSETRON HCL 4 MG/2ML IJ SOLN: 4.0000 mg | Freq: Once | INTRAMUSCULAR | Status: DC | PRN

## 2014-03-05 MED ORDER — NEOSTIGMINE METHYLSULFATE 10 MG/10ML IV SOLN
INTRAVENOUS | Status: DC | PRN
  Administered 2014-03-05: 3 mg via INTRAVENOUS

## 2014-03-05 MED ORDER — LIDOCAINE HCL (CARDIAC) 20 MG/ML IV SOLN
INTRAVENOUS | Status: DC | PRN
  Administered 2014-03-05: 50 mg via INTRAVENOUS

## 2014-03-05 MED ORDER — LACTATED RINGERS IR SOLN
Status: DC | PRN
  Administered 2014-03-05: 3000 mL

## 2014-03-05 MED ORDER — GLYCOPYRROLATE 0.2 MG/ML IJ SOLN
INTRAMUSCULAR | Status: AC
  Filled 2014-03-05: qty 2

## 2014-03-05 MED ORDER — ALBUTEROL SULFATE (2.5 MG/3ML) 0.083% IN NEBU: 2.5000 mg | INHALATION_SOLUTION | RESPIRATORY_TRACT | Status: DC | PRN

## 2014-03-05 MED ORDER — LIDOCAINE HCL (CARDIAC) 20 MG/ML IV SOLN
INTRAVENOUS | Status: AC
  Filled 2014-03-05: qty 5

## 2014-03-05 MED ORDER — HYDROMORPHONE HCL PF 1 MG/ML IJ SOLN
0.2000 mg | INTRAMUSCULAR | Status: DC | PRN
  Administered 2014-03-05: 0.4 mg via INTRAVENOUS
  Administered 2014-03-05: 0.2 mg via INTRAVENOUS
  Filled 2014-03-05 (×2): qty 1

## 2014-03-05 MED ORDER — PROPOFOL 10 MG/ML IV EMUL
INTRAVENOUS | Status: AC
  Filled 2014-03-05: qty 20

## 2014-03-05 MED ORDER — ONDANSETRON HCL 4 MG/2ML IJ SOLN
INTRAMUSCULAR | Status: AC
  Filled 2014-03-05: qty 2

## 2014-03-05 MED ORDER — HYDROMORPHONE HCL PF 1 MG/ML IJ SOLN
INTRAMUSCULAR | Status: AC
  Administered 2014-03-05: 0.5 mg via INTRAVENOUS
  Filled 2014-03-05: qty 1

## 2014-03-05 MED ORDER — CEFAZOLIN SODIUM-DEXTROSE 2-3 GM-% IV SOLR
2.0000 g | INTRAVENOUS | Status: AC
  Administered 2014-03-05: 2 g via INTRAVENOUS

## 2014-03-05 MED ORDER — LACTATED RINGERS IV SOLN
INTRAVENOUS | Status: DC
  Administered 2014-03-05 (×3): via INTRAVENOUS

## 2014-03-05 MED ORDER — HYDROMORPHONE HCL PF 1 MG/ML IJ SOLN
0.2500 mg | INTRAMUSCULAR | Status: DC | PRN
  Administered 2014-03-05 (×6): 0.5 mg via INTRAVENOUS

## 2014-03-05 MED ORDER — MENTHOL 3 MG MT LOZG: 1.0000 | LOZENGE | OROMUCOSAL | Status: DC | PRN

## 2014-03-05 MED ORDER — LISINOPRIL 20 MG PO TABS
20.0000 mg | ORAL_TABLET | Freq: Every day | ORAL | Status: DC
  Administered 2014-03-06: 20 mg via ORAL
  Filled 2014-03-05 (×2): qty 1

## 2014-03-05 MED ORDER — PANTOPRAZOLE SODIUM 40 MG PO TBEC
40.0000 mg | DELAYED_RELEASE_TABLET | Freq: Every day | ORAL | Status: DC
  Administered 2014-03-06: 40 mg via ORAL
  Filled 2014-03-05: qty 1

## 2014-03-05 MED ORDER — ROPIVACAINE HCL 5 MG/ML IJ SOLN
INTRAMUSCULAR | Status: DC | PRN
  Administered 2014-03-05: 60 mL

## 2014-03-05 MED ORDER — BUPIVACAINE HCL (PF) 0.25 % IJ SOLN
INTRAMUSCULAR | Status: DC | PRN
  Administered 2014-03-05: 12 mL

## 2014-03-05 MED ORDER — KETOROLAC TROMETHAMINE 30 MG/ML IJ SOLN: 15.0000 mg | Freq: Once | INTRAMUSCULAR | Status: DC | PRN

## 2014-03-05 MED ORDER — LISINOPRIL-HYDROCHLOROTHIAZIDE 20-12.5 MG PO TABS: 1.0000 | ORAL_TABLET | Freq: Every evening | ORAL | Status: DC

## 2014-03-05 MED ORDER — BELLADONNA ALKALOIDS-OPIUM 16.2-60 MG RE SUPP
1.0000 | Freq: Once | RECTAL | Status: AC
  Administered 2014-03-05: 1 via RECTAL
  Filled 2014-03-05: qty 1

## 2014-03-05 MED ORDER — MEPERIDINE HCL 25 MG/ML IJ SOLN
INTRAMUSCULAR | Status: AC
  Administered 2014-03-05: 12.5 mg via INTRAVENOUS
  Filled 2014-03-05: qty 1

## 2014-03-05 MED ORDER — ROPIVACAINE HCL 5 MG/ML IJ SOLN
INTRAMUSCULAR | Status: AC
  Filled 2014-03-05: qty 60

## 2014-03-05 MED ORDER — ROCURONIUM BROMIDE 100 MG/10ML IV SOLN
INTRAVENOUS | Status: DC | PRN
  Administered 2014-03-05: 10 mg via INTRAVENOUS
  Administered 2014-03-05: 40 mg via INTRAVENOUS
  Administered 2014-03-05 (×2): 10 mg via INTRAVENOUS

## 2014-03-05 MED ORDER — BUPIVACAINE HCL (PF) 0.25 % IJ SOLN
INTRAMUSCULAR | Status: AC
  Filled 2014-03-05: qty 30

## 2014-03-05 MED ORDER — MEPERIDINE HCL 25 MG/ML IJ SOLN
6.2500 mg | INTRAMUSCULAR | Status: DC | PRN
  Administered 2014-03-05: 12.5 mg via INTRAVENOUS

## 2014-03-05 MED ORDER — ONDANSETRON HCL 4 MG PO TABS: 4.0000 mg | ORAL_TABLET | Freq: Four times a day (QID) | ORAL | Status: DC | PRN

## 2014-03-05 MED ORDER — HYDROMORPHONE HCL PF 1 MG/ML IJ SOLN
INTRAMUSCULAR | Status: AC
  Filled 2014-03-05: qty 1

## 2014-03-05 MED ORDER — PROPOFOL 10 MG/ML IV BOLUS
INTRAVENOUS | Status: DC | PRN
Start: 2014-03-05 — End: 2014-03-05
  Administered 2014-03-05: 160 mg via INTRAVENOUS

## 2014-03-05 MED ORDER — GLYCOPYRROLATE 0.2 MG/ML IJ SOLN
INTRAMUSCULAR | Status: DC | PRN
  Administered 2014-03-05: 0.4 mg via INTRAVENOUS

## 2014-03-05 MED ORDER — CEFAZOLIN SODIUM-DEXTROSE 2-3 GM-% IV SOLR
INTRAVENOUS | Status: AC
  Filled 2014-03-05: qty 50

## 2014-03-05 MED ORDER — MIDAZOLAM HCL 2 MG/2ML IJ SOLN
INTRAMUSCULAR | Status: DC | PRN
  Administered 2014-03-05: 2 mg via INTRAVENOUS

## 2014-03-05 MED ORDER — PNEUMOCOCCAL VAC POLYVALENT 25 MCG/0.5ML IJ INJ
0.5000 mL | INJECTION | INTRAMUSCULAR | Status: AC
  Administered 2014-03-06: 0.5 mL via INTRAMUSCULAR
  Filled 2014-03-05 (×2): qty 0.5

## 2014-03-05 MED ORDER — DEXTROSE IN LACTATED RINGERS 5 % IV SOLN: INTRAVENOUS | Status: DC

## 2014-03-05 MED ORDER — MIDAZOLAM HCL 2 MG/2ML IJ SOLN
INTRAMUSCULAR | Status: AC
  Filled 2014-03-05: qty 2

## 2014-03-05 MED ORDER — PHENYLEPHRINE HCL 10 MG/ML IJ SOLN
INTRAMUSCULAR | Status: DC | PRN
  Administered 2014-03-05 (×2): 80 ug via INTRAVENOUS

## 2014-03-05 MED ORDER — IBUPROFEN 800 MG PO TABS
800.0000 mg | ORAL_TABLET | Freq: Three times a day (TID) | ORAL | Status: DC | PRN
  Administered 2014-03-05 – 2014-03-06 (×2): 800 mg via ORAL
  Filled 2014-03-05 (×2): qty 1

## 2014-03-05 MED ORDER — OXYCODONE-ACETAMINOPHEN 5-325 MG PO TABS
1.0000 | ORAL_TABLET | ORAL | Status: DC | PRN
  Administered 2014-03-05 – 2014-03-06 (×4): 2 via ORAL
  Filled 2014-03-05 (×4): qty 2

## 2014-03-05 MED ORDER — DEXAMETHASONE SODIUM PHOSPHATE 10 MG/ML IJ SOLN
INTRAMUSCULAR | Status: DC | PRN
  Administered 2014-03-05: 10 mg via INTRAVENOUS

## 2014-03-05 MED ORDER — FENTANYL CITRATE 0.05 MG/ML IJ SOLN
INTRAMUSCULAR | Status: DC | PRN
Start: 2014-03-05 — End: 2014-03-05
  Administered 2014-03-05 (×2): 100 ug via INTRAVENOUS
  Administered 2014-03-05: 50 ug via INTRAVENOUS

## 2014-03-05 SURGICAL SUPPLY — 57 items
BARRIER ADHS 3X4 INTERCEED (GAUZE/BANDAGES/DRESSINGS) ×2 IMPLANT
CATH FOLEY 3WAY  5CC 16FR (CATHETERS) ×1
CATH FOLEY 3WAY 5CC 16FR (CATHETERS) ×1 IMPLANT
CLOTH BEACON ORANGE TIMEOUT ST (SAFETY) ×2 IMPLANT
CONT PATH 16OZ SNAP LID 3702 (MISCELLANEOUS) ×2 IMPLANT
COVER MAYO STAND STRL (DRAPES) ×2 IMPLANT
COVER TABLE BACK 60X90 (DRAPES) ×4 IMPLANT
COVER TIP SHEARS 8 DVNC (MISCELLANEOUS) ×1 IMPLANT
COVER TIP SHEARS 8MM DA VINCI (MISCELLANEOUS) ×1
DECANTER SPIKE VIAL GLASS SM (MISCELLANEOUS) ×2 IMPLANT
DERMABOND ADVANCED (GAUZE/BANDAGES/DRESSINGS) ×1
DERMABOND ADVANCED .7 DNX12 (GAUZE/BANDAGES/DRESSINGS) ×1 IMPLANT
DEVICE TROCAR PUNCTURE CLOSURE (ENDOMECHANICALS) IMPLANT
DRAPE HUG U DISPOSABLE (DRAPE) ×2 IMPLANT
DRAPE WARM FLUID 44X44 (DRAPE) ×2 IMPLANT
DRSG COVADERM PLUS 2X2 (GAUZE/BANDAGES/DRESSINGS) ×2 IMPLANT
DRSG OPSITE POSTOP 3X4 (GAUZE/BANDAGES/DRESSINGS) ×2 IMPLANT
DURAPREP 26ML APPLICATOR (WOUND CARE) ×2 IMPLANT
ELECT REM PT RETURN 9FT ADLT (ELECTROSURGICAL) ×2
ELECTRODE REM PT RTRN 9FT ADLT (ELECTROSURGICAL) ×1 IMPLANT
EVACUATOR SMOKE 8.L (FILTER) ×2 IMPLANT
GAUZE VASELINE 3X9 (GAUZE/BANDAGES/DRESSINGS) IMPLANT
GLOVE BIOGEL PI IND STRL 7.0 (GLOVE) ×4 IMPLANT
GLOVE BIOGEL PI INDICATOR 7.0 (GLOVE) ×4
GLOVE ECLIPSE 6.5 STRL STRAW (GLOVE) ×2 IMPLANT
GOWN STRL REUS W/TWL LRG LVL3 (GOWN DISPOSABLE) ×20 IMPLANT
KIT ACCESSORY DA VINCI DISP (KITS) ×1
KIT ACCESSORY DVNC DISP (KITS) ×1 IMPLANT
LEGGING LITHOTOMY PAIR STRL (DRAPES) ×2 IMPLANT
NEEDLE INSUFFLATION 150MM (ENDOMECHANICALS) ×2 IMPLANT
OCCLUDER COLPOPNEUMO (BALLOONS) IMPLANT
PACK ROBOT WH (CUSTOM PROCEDURE TRAY) ×2 IMPLANT
PAD PREP 24X48 CUFFED NSTRL (MISCELLANEOUS) ×4 IMPLANT
PROTECTOR NERVE ULNAR (MISCELLANEOUS) ×4 IMPLANT
SCISSORS LAP 5X35 DISP (ENDOMECHANICALS) IMPLANT
SET CYSTO W/LG BORE CLAMP LF (SET/KITS/TRAYS/PACK) IMPLANT
SET IRRIG TUBING LAPAROSCOPIC (IRRIGATION / IRRIGATOR) ×2 IMPLANT
SUT VIC AB 0 CT1 36 (SUTURE) ×4 IMPLANT
SUT VICRYL 0 UR6 27IN ABS (SUTURE) ×2 IMPLANT
SUT VICRYL 4-0 PS2 18IN ABS (SUTURE) ×4 IMPLANT
SUT VLOC 180 0 9IN  GS21 (SUTURE) ×1
SUT VLOC 180 0 9IN GS21 (SUTURE) ×1 IMPLANT
SYRINGE 10CC LL (SYRINGE) ×2 IMPLANT
SYRINGE 60CC LL (MISCELLANEOUS) ×2 IMPLANT
TIP RUMI ORANGE 6.7MMX12CM (TIP) IMPLANT
TIP UTERINE 5.1X6CM LAV DISP (MISCELLANEOUS) IMPLANT
TIP UTERINE 6.7X10CM GRN DISP (MISCELLANEOUS) IMPLANT
TIP UTERINE 6.7X6CM WHT DISP (MISCELLANEOUS) IMPLANT
TIP UTERINE 6.7X8CM BLUE DISP (MISCELLANEOUS) ×2 IMPLANT
TOWEL OR 17X24 6PK STRL BLUE (TOWEL DISPOSABLE) ×6 IMPLANT
TROCAR DISP BLADELESS 8 DVNC (TROCAR) IMPLANT
TROCAR DISP BLADELESS 8MM (TROCAR)
TROCAR XCEL 12X100 BLDLESS (ENDOMECHANICALS) ×2 IMPLANT
TROCAR XCEL NON-BLD 5MMX100MML (ENDOMECHANICALS) ×4 IMPLANT
TROCAR Z-THREAD 12X150 (TROCAR) ×2 IMPLANT
WARMER LAPAROSCOPE (MISCELLANEOUS) ×2 IMPLANT
WATER STERILE IRR 1000ML POUR (IV SOLUTION) ×6 IMPLANT

## 2014-03-05 NOTE — Transfer of Care (Signed)
Immediate Anesthesia Transfer of Care Note  Patient: Kelly Walton  Procedure(s) Performed: Procedure(s): ROBOTIC ASSISTED TOTAL HYSTERECTOMY/BILATERAL SALPINGECTOMY (N/A)  Patient Location: PACU  Anesthesia Type:General  Level of Consciousness: awake  Airway & Oxygen Therapy: Patient Spontanous Breathing  Post-op Assessment: Report given to PACU RN  Post vital signs: stable  Filed Vitals:   03/05/14 1205  BP: 142/89  Pulse: 92  Temp: 36.7 C  Resp: 18    Complications: No apparent anesthesia complications

## 2014-03-05 NOTE — Anesthesia Postprocedure Evaluation (Signed)
  Anesthesia Post Note  Patient: Kelly Walton  Procedure(s) Performed: Procedure(s) (LRB): ROBOTIC ASSISTED TOTAL HYSTERECTOMY/BILATERAL SALPINGECTOMY (N/A)  Anesthesia type: GA  Patient location: PACU  Post pain: Pain level controlled  Post assessment: Post-op Vital signs reviewed  Last Vitals:  Filed Vitals:   03/05/14 1600  BP:   Pulse: 99  Temp:   Resp: 22    Post vital signs: Reviewed  Level of consciousness: sedated  Complications: No apparent anesthesia complications

## 2014-03-05 NOTE — Brief Op Note (Signed)
03/05/2014  3:54 PM  PATIENT:  Maurie Boettcherammy A Viscardi  43 y.o. female  PRE-OPERATIVE DIAGNOSIS:  Menorrhagia(DUB), Dysmenorrhea  POST-OPERATIVE DIAGNOSIS:  Menorrhagia( DUB),dysmenorrhea, anterior abdominal wall adhesion  PROCEDURE:  Procedure(s): ROBOTIC ASSISTED TOTAL HYSTERECTOMY/BILATERAL SALPINGECTOMY (N/A), lysis of adhesion  SURGEON:  Surgeon(s) and Role:    * Harwood Nall Cathie BeamsA Jenny Lai, MD - Primary    * Genia DelMarie-Lyne Lavoie, MD - Assisting  PHYSICIAN ASSISTANT:   ASSISTANTS: Genia DelMarie-Lyne lavoie, MD   ANESTHESIA:   general  Findings: pt bleeding heavily. Nl uterus, no evidence of endometriosis, surgical separated tubes, nl ovaries, nl ureters bilaterally, nl liver edge, omental adhesion to anterior abdominal wall/umb region  EBL:  Total I/O In: 1200 [I.V.:1200] Out: 120 [Urine:70; Blood:50]  BLOOD ADMINISTERED:none  DRAINS: none   LOCAL MEDICATIONS USED:  MARCAINE    and BUPIVICAINE   SPECIMEN:  Source of Specimen:  uterus, cervix, tubes  DISPOSITION OF SPECIMEN:  PATHOLOGY  COUNTS:  YES  TOURNIQUET:  * No tourniquets in log *  DICTATION: .Other Dictation: Dictation Number [id number]  PLAN OF CARE: Admit for overnight observation  PATIENT DISPOSITION:  PACU - hemodynamically stable.   Delay start of Pharmacological VTE agent (>24hrs) due to surgical blood loss or risk of bleeding: no

## 2014-03-05 NOTE — Anesthesia Preprocedure Evaluation (Addendum)
Anesthesia Evaluation  Patient identified by MRN, date of birth, ID band Patient awake    Reviewed: Allergy & Precautions, H&P , Patient's Chart, lab work & pertinent test results  Airway Mallampati: I TM Distance: >3 FB Neck ROM: full    Dental no notable dental hx. (+) Teeth Intact   Pulmonary    Pulmonary exam normal       Cardiovascular hypertension, Pt. on medications     Neuro/Psych negative neurological ROS  negative psych ROS   GI/Hepatic negative GI ROS, Neg liver ROS,   Endo/Other  negative endocrine ROS  Renal/GU negative Renal ROS     Musculoskeletal   Abdominal Normal abdominal exam  (+)   Peds  Hematology negative hematology ROS (+)   Anesthesia Other Findings   Reproductive/Obstetrics negative OB ROS                           Anesthesia Physical Anesthesia Plan  ASA: II  Anesthesia Plan: General   Post-op Pain Management:    Induction: Intravenous  Airway Management Planned: Oral ETT  Additional Equipment:   Intra-op Plan:   Post-operative Plan: Extubation in OR  Informed Consent: I have reviewed the patients History and Physical, chart, labs and discussed the procedure including the risks, benefits and alternatives for the proposed anesthesia with the patient or authorized representative who has indicated his/her understanding and acceptance.   Dental Advisory Given  Plan Discussed with: CRNA, Anesthesiologist and Surgeon  Anesthesia Plan Comments: (Patient saw a cardiologist who performed an ECG and based on that and history cleared the pt for surgery today and faxed said clearance to Dr. Cherly Hensenousins.)        Anesthesia Quick Evaluation

## 2014-03-06 ENCOUNTER — Encounter (HOSPITAL_COMMUNITY): Payer: Self-pay | Admitting: Obstetrics and Gynecology

## 2014-03-06 LAB — BASIC METABOLIC PANEL
BUN: 7 mg/dL (ref 6–23)
CHLORIDE: 101 meq/L (ref 96–112)
CO2: 24 meq/L (ref 19–32)
Calcium: 8.4 mg/dL (ref 8.4–10.5)
Creatinine, Ser: 0.7 mg/dL (ref 0.50–1.10)
GFR calc Af Amer: >90 mL/min (ref >90)
GFR calc non Af Amer: >90 mL/min (ref >90)
GLUCOSE: 133 mg/dL — AB (ref 70–99)
POTASSIUM: 4.5 meq/L (ref 3.7–5.3)
Sodium: 136 mEq/L — ABNORMAL LOW (ref 137–147)

## 2014-03-06 LAB — CBC
HEMATOCRIT: 33.9 % — AB (ref 36.0–46.0)
HEMOGLOBIN: 10.9 g/dL — AB (ref 12.0–15.0)
MCH: 27.5 pg (ref 26.0–34.0)
MCHC: 32.2 g/dL (ref 30.0–36.0)
MCV: 85.4 fL (ref 78.0–100.0)
Platelets: 252 10*3/uL (ref 150–400)
RBC: 3.97 MIL/uL (ref 3.87–5.11)
RDW: 15.2 % (ref 11.5–15.5)
WBC: 11.4 10*3/uL — ABNORMAL HIGH (ref 4.0–10.5)

## 2014-03-06 MED ORDER — BELLADONNA ALKALOIDS-OPIUM 16.2-60 MG RE SUPP: 1.0000 | Freq: Four times a day (QID) | RECTAL | Status: DC | PRN

## 2014-03-06 MED ORDER — OXYCODONE-ACETAMINOPHEN 5-325 MG PO TABS: 1.0000 | ORAL_TABLET | ORAL | Status: DC | PRN

## 2014-03-06 MED ORDER — IBUPROFEN 800 MG PO TABS: 800.0000 mg | ORAL_TABLET | Freq: Three times a day (TID) | ORAL | Status: DC | PRN

## 2014-03-06 MED ORDER — BELLADONNA ALKALOIDS-OPIUM 16.2-60 MG RE SUPP
1.0000 | Freq: Four times a day (QID) | RECTAL | Status: DC | PRN
  Administered 2014-03-06: 1 via RECTAL
  Filled 2014-03-06: qty 1

## 2014-03-06 NOTE — Progress Notes (Signed)
Subjective: Patient reports incisional pain, tolerating PO and no problems voiding.  Notes bladder spasm  Objective: I have reviewed patient's vital signs.  vital signs, intake and output and labs. Filed Vitals:   03/06/14 0129  BP: 120/78  Pulse: 100  Temp: 97.9 F (36.6 C)  Resp: 16   I/O last 3 completed shifts: In: 4083.8 [P.O.:1180; I.V.:2903.8] Out: 1370 [Urine:1320; Blood:50] Total I/O In: -  Out: 250 [Urine:250]  Lab Results  Component Value Date   WBC 11.4* 03/06/2014   HGB 10.9* 03/06/2014   HCT 33.9* 03/06/2014   MCV 85.4 03/06/2014   PLT 252 03/06/2014   Lab Results  Component Value Date   CREATININE 0.70 03/06/2014    EXAM General: alert, cooperative and no distress Resp: clear to auscultation bilaterally Cardio: regular rate and rhythm, S1, S2 normal, no murmur, click, rub or gallop GI: soft, non-tender; bowel sounds normal; no masses,  no organomegaly and incision: clean, dry and intact Extremities: no edema, redness or tenderness in the calves or thighs Vaginal Bleeding: none  Assessment: s/p Procedure(s): ROBOTIC ASSISTED TOTAL HYSTERECTOMY/BILATERAL SALPINGECTOMY: stable, progressing well and tolerating diet Bladder spasm D/c instructions reviewed  Plan: Encourage ambulation Discontinue IV fluids Discharge home Belladonna suppository for bladder discomfort  LOS: 1 day    COUSINS,SHERONETTE A, MD 03/06/2014 8:29 AM    03/06/2014, 8:29 AM

## 2014-03-06 NOTE — Discharge Summary (Signed)
Physician Discharge Summary  Patient ID: Kelly Walton A Urton MRN: [id number] DOB/AGE: [birth year] 43 y.o.  Admit date: 03/05/2014 Discharge date: 03/06/2014  Admission Diagnoses: DUB( menorrhagia), dysmenorrhea  Discharge Diagnoses: DUB( menorrhagia) , dysmenorrhea, anterior abdominal wall adhesions Active Problems:   S/P hysterectomy   Discharged Condition: stable  Hospital Course: [facility] underwent DaVinci total hysterectomy, bilateral salpingectomy, adhesiolysis. Postop course uncomplicated. (+) bladder spasm for which pt was given belladonna suppository  Consults: None  Significant Diagnostic Studies: labs:  CBC    Component Value Date/Time   WBC 11.4* 03/06/2014 0456   RBC 3.97 03/06/2014 0456   HGB 10.9* 03/06/2014 0456   HCT 33.9* 03/06/2014 0456   PLT 252 03/06/2014 0456   MCV 85.4 03/06/2014 0456   MCH 27.5 03/06/2014 0456   MCHC 32.2 03/06/2014 0456   RDW 15.2 03/06/2014 0456   LYMPHSABS 1.1 01/19/2014 1022   MONOABS 0.4 01/19/2014 1022   EOSABS 0.1 01/19/2014 1022   BASOSABS 0.0 01/19/2014 1022      Treatments: surgery: davinci total hysterectomy, bilateral salpingectomy, adhesiolysis  Discharge Exam: Blood pressure 120/78, pulse 100, temperature 97.9 F (36.6 C), temperature source Oral, resp. rate 16, height 5\' 5"  (1.651 m), weight 53.524 kg (118 lb), last menstrual period 03/05/2014, SpO2 100.00%. General appearance: alert, cooperative and no distress Resp: clear to auscultation bilaterally Cardio: regular rate and rhythm, S1, S2 normal, no murmur, click, rub or gallop GI: soft, non-tender; bowel sounds normal; no masses,  no organomegaly Pelvic: deferred Extremities: no edema, redness or tenderness in the calves or thighs Incision d/c/i honeycomb dressing Back no CVAT Pad no blood Disposition: 01-Home or Self Care  Discharge Instructions   Diet - low sodium heart healthy    Complete by:  As directed      Discharge instructions    Complete  by:  As directed   Call if temperature greater than equal to 100.4, nothing per vagina for 4-6 weeks or severe nausea vomiting, increased incisional pain , drainage or redness in the incision site, no straining with bowel movements, showers no bath     Discharge patient    Complete by:  As directed      Increase activity slowly    Complete by:  As directed      No wound care    Complete by:  As directed             Medication List         albuterol 108 (90 BASE) MCG/ACT inhaler  Commonly known as:  PROVENTIL HFA;VENTOLIN HFA  Inhale 2 puffs into the lungs every 4 (four) hours as needed for wheezing or shortness of breath. For shortness of breath     ibuprofen 800 MG tablet  Commonly known as:  ADVIL,MOTRIN  Take 1 tablet (800 mg total) by mouth every 8 (eight) hours as needed (mild pain).     lisinopril-hydrochlorothiazide 20-12.5 MG per tablet  Commonly known as:  PRINZIDE,ZESTORETIC  Take 1 tablet by mouth every evening.     oxyCODONE-acetaminophen 5-325 MG per tablet  Commonly known as:  PERCOCET/ROXICET  Take 1-2 tablets by mouth every 4 (four) hours as needed for severe pain (moderate to severe pain (when tolerating fluids)).     zolpidem 10 MG tablet  Commonly known as:  AMBIEN  Take 10 mg by mouth at bedtime. For sleep           Follow-up Information   Follow up with Tashiya Souders A,  MD In 2 weeks.   Specialty:  Obstetrics and Gynecology   Contact information:   [address] [place] [address] [phone]       Signed: Everlean Bucher A 03/06/2014, 8:30 AM

## 2014-03-06 NOTE — Op Note (Signed)
NAMDesmond Walton:  Lusignan, Jaquelyne                ACCOUNT NO.:  [id number]  MEDICAL RECORD NO.:  [id number]  LOCATION:  9304                          FACILITY:  [facility]  PHYSICIAN:  Maxie BetterSheronette Cousins, M.D.DATE OF BIRTH:  Sep 17, 1970  DATE OF PROCEDURE:  03/05/2014 DATE OF DISCHARGE:                              OPERATIVE REPORT   PREOPERATIVE DIAGNOSES:  Dysfunctional uterine bleeding, pelvic pain, history of cesarean section x2.  POSTOPERATIVE DIAGNOSES:  Abdominal wall adhesions, dysfunctional uterine bleeding, pelvic pain, history of cesarean section x2.  PROCEDURE:  Da Vinci robotic total hysterectomy, bilateral salpingectomy, lysis of adhesions.  ANESTHESIA:  General.  SURGEON:  Maxie BetterSheronette Cousins, M.D.  ASSISTANT:  Genia DelMarie-Lyne Lavoie, M.D.  DESCRIPTION OF PROCEDURE:  Under adequate general anesthesia, the patient was positioned for robotic surgery.  She was in the dorsal lithotomy position.  Examination under anesthesia revealed anteverted uterus.  No adnexal masses could be appreciated.  The patient was sterilely prepped and draped in usual sterile fashion.  Indwelling Foley catheter was sterilely placed.  A weighted speculum was placed in vagina.  Sims retractor was placed anteriorly.  The 0-Vicryl figure-of- eight suture was placed in the anteroposterior lip of the cervix.  The uterus sounded to 8 cm.  Using this medium small KOH ring and an #8 uterine manipulator, the uterine cavity was entered and the balloon insufflated.  The retractors were removed, attention was turned to the abdomen.  Due to the patient's small short trunk and prior surgery, a supraumbilical incision was made after 0.25% Marcaine was injected. Veress needle was introduced, tested with normal saline, and 3 L of CO2 was insufflated.  Veress needle was then removed.  A #12 mm trocar with sleeve was introduced in the abdomen without incident.  The robotic camera was inserted.  The patient was subsequently positioned  and placed in Trendelenburg position.  Omental adhesion to the anterior abdominal wall and to the umbilical region was noted.  The uterus was otherwise noted to be normal.  Surgical separation of both fallopian tubes was noted and both ovaries were normal.  Normal liver edge was seen.  A right lower quadrant robotic 8-mm assistant ports were placed under direct visualization and 5 mm assistant port was subsequently placed on the right side between the camera port and the right lower quadrant robotic site.  Once all this was done, the robot was docked to the patient's left side.  PK dissector was placed in arm #2 and monopolar scissors were placed in arm #1.  At that point, I went to the surgical console.  At the surgical console, using the countertraction, the omental adhesion to the anterior abdominal wall was lysed and removed. No evidence of endometriosis was noted in the posterior or anterior cul- de-sac.  Both ureters were identified, noted to be peristalsing normally deep in the field.  The procedure started on the left side where the retroperitoneal space was opened.  A small hole was made in that space. The distal portion of the fallopian tube attached to the ovary was serially clamped, cauterized, and then cut until the tube was removed. The left utero-ovarian ligament was then clamped, cauterized, and cut, followed by the retroperitoneal  space being opened further and the bladder displaced inferiorly.  The round ligament on the left was serially clamped, cauterized, and then cut.  The uterine vessels were skeletonized, they were cauterized, but not cut.  The vesicouterine peritoneum was opened transversely.  The bladder was sharply dissected off the lower uterine segment and RUMI cup and displaced inferiorly in the opposite side.  Again, the fallopian tube segment was removed followed by opening of retroperitoneal space, I went down the posterior broad ligament resulting in  utero-ovarian ligaments being clamped, cauterized, and then cut.  The uterine vessels were skeletonized, and noted to be tortuous.  The vagina was insufflated, the vessels were seen, but not cauterized.  Anterior cuff opening was then done followed by the posterior opening.  At the junction of the upper part of the RUMI cup anteriorly and posteriorly was opened the cervicovaginal junction. After this was accomplished, the uterine vessels were then serially clamped, cauterized, and then cut bilaterally with removal of the uterus with the proximal portion of the tube and the distal 2 portions of fallopian tubes as well, all being removed vaginally.  The vaginal cuff had some bleeding, which was easily cauterized.  The bladder was displaced even further.  The instruments were then removed and replaced with a long tipped forceps and a large Mega suture needle driver.  A 9- inch 0 V-Loc suture was then placed and the vaginal cuff was closed with 0 V-Loc running stitch.  Once this was done, the abdomen was irrigated and suctioned of debris.  The specimen, which had been removed included individual fallopian tubes.  ESTIMATED BLOOD LOSS:  50 mL.  INTRAOPERATIVE FLUIDS:  2 L.  URINE OUTPUT:  125 mL  clear yellow urine.  SPECIMEN:  Uterus, cervix, both fallopian tubes.  DISPOSITION:  The patient was transferred to the recovery room in  stable condition.     Maxie BetterSheronette Cousins, M.D.     [place]/MEDQ  D:  03/05/2014  T:  03/06/2014  Job:  [id number]

## 2014-03-06 NOTE — Progress Notes (Signed)
Pt Out in  Wheelchair  Teaching comp[lete  

## 2014-04-05 ENCOUNTER — Ambulatory Visit: Payer: BC Managed Care – PPO | Admitting: Internal Medicine

## 2015-01-23 ENCOUNTER — Emergency Department (HOSPITAL_COMMUNITY): Payer: BLUE CROSS/BLUE SHIELD

## 2015-01-23 ENCOUNTER — Emergency Department (HOSPITAL_COMMUNITY)
Admission: EM | Admit: 2015-01-23 | Discharge: 2015-01-23 | Disposition: A | Discharge status: Home / Self Care | Payer: BLUE CROSS/BLUE SHIELD | Attending: Emergency Medicine | Admitting: Emergency Medicine

## 2015-01-23 ENCOUNTER — Encounter (HOSPITAL_COMMUNITY): Payer: Self-pay

## 2015-01-23 DIAGNOSIS — R1012 Left upper quadrant pain: Secondary | ICD-10-CM

## 2015-01-23 DIAGNOSIS — R5383 Other fatigue: Secondary | ICD-10-CM | POA: Insufficient documentation

## 2015-01-23 DIAGNOSIS — I10 Essential (primary) hypertension: Secondary | ICD-10-CM | POA: Diagnosis not present

## 2015-01-23 DIAGNOSIS — J45909 Unspecified asthma, uncomplicated: Secondary | ICD-10-CM | POA: Diagnosis not present

## 2015-01-23 DIAGNOSIS — R112 Nausea with vomiting, unspecified: Secondary | ICD-10-CM | POA: Diagnosis not present

## 2015-01-23 DIAGNOSIS — Z79899 Other long term (current) drug therapy: Secondary | ICD-10-CM | POA: Insufficient documentation

## 2015-01-23 DIAGNOSIS — Z9851 Tubal ligation status: Secondary | ICD-10-CM | POA: Diagnosis not present

## 2015-01-23 DIAGNOSIS — R109 Unspecified abdominal pain: Secondary | ICD-10-CM | POA: Diagnosis present

## 2015-01-23 LAB — URINALYSIS, ROUTINE W REFLEX MICROSCOPIC
Bilirubin Urine: NEGATIVE
GLUCOSE, UA: NEGATIVE mg/dL
Hgb urine dipstick: NEGATIVE
KETONES UR: NEGATIVE mg/dL
Leukocytes, UA: NEGATIVE
Nitrite: NEGATIVE
Protein, ur: NEGATIVE mg/dL
Specific Gravity, Urine: 1.008 (ref 1.005–1.030)
Urobilinogen, UA: 0.2 mg/dL (ref 0.0–1.0)
pH: 6.5 (ref 5.0–8.0)

## 2015-01-23 LAB — CBC WITH DIFFERENTIAL/PLATELET
BASOS PCT: 0 % (ref 0–1)
Basophils Absolute: 0 10*3/uL (ref 0.0–0.1)
EOS PCT: 0 % (ref 0–5)
Eosinophils Absolute: 0 10*3/uL (ref 0.0–0.7)
HCT: 40.4 % (ref 36.0–46.0)
Hemoglobin: 13.5 g/dL (ref 12.0–15.0)
LYMPHS ABS: 0.4 10*3/uL — AB (ref 0.7–4.0)
LYMPHS PCT: 18 % (ref 12–46)
MCH: 28.8 pg (ref 26.0–34.0)
MCHC: 33.4 g/dL (ref 30.0–36.0)
MCV: 86.1 fL (ref 78.0–100.0)
MONOS PCT: 5 % (ref 3–12)
Monocytes Absolute: 0.1 10*3/uL (ref 0.1–1.0)
NEUTROS ABS: 1.8 10*3/uL (ref 1.7–7.7)
NEUTROS PCT: 77 % (ref 43–77)
PLATELETS: 77 10*3/uL — AB (ref 150–400)
RBC: 4.69 MIL/uL (ref 3.87–5.11)
RDW: 13.7 % (ref 11.5–15.5)
WBC: 2.3 10*3/uL — AB (ref 4.0–10.5)

## 2015-01-23 LAB — LIPASE, BLOOD: Lipase: 20 U/L — ABNORMAL LOW (ref 22–51)

## 2015-01-23 LAB — COMPREHENSIVE METABOLIC PANEL
ALK PHOS: 65 U/L (ref 38–126)
ALT: 31 U/L (ref 14–54)
AST: 46 U/L — AB (ref 15–41)
Albumin: 4.3 g/dL (ref 3.5–5.0)
Anion gap: 11 (ref 5–15)
BILIRUBIN TOTAL: 0.7 mg/dL (ref 0.3–1.2)
BUN: 8 mg/dL (ref 6–20)
CHLORIDE: 100 mmol/L — AB (ref 101–111)
CO2: 24 mmol/L (ref 22–32)
Calcium: 9 mg/dL (ref 8.9–10.3)
Creatinine, Ser: 1.01 mg/dL — ABNORMAL HIGH (ref 0.44–1.00)
GFR calc Af Amer: >60 mL/min (ref >60)
GFR calc non Af Amer: >60 mL/min (ref >60)
Glucose, Bld: 106 mg/dL — ABNORMAL HIGH (ref 65–99)
POTASSIUM: 3.4 mmol/L — AB (ref 3.5–5.1)
Sodium: 135 mmol/L (ref 135–145)
Total Protein: 7.6 g/dL (ref 6.5–8.1)

## 2015-01-23 MED ORDER — OXYCODONE-ACETAMINOPHEN 5-325 MG PO TABS: 1.0000 | ORAL_TABLET | Freq: Four times a day (QID) | ORAL | Status: DC | PRN

## 2015-01-23 MED ORDER — ONDANSETRON 4 MG PO TBDP: ORAL_TABLET | ORAL | Status: DC

## 2015-01-23 MED ORDER — ONDANSETRON HCL 4 MG/2ML IJ SOLN
4.0000 mg | Freq: Once | INTRAMUSCULAR | Status: AC
  Administered 2015-01-23: 4 mg via INTRAVENOUS
  Filled 2015-01-23: qty 2

## 2015-01-23 MED ORDER — KETOROLAC TROMETHAMINE 30 MG/ML IJ SOLN
30.0000 mg | Freq: Once | INTRAMUSCULAR | Status: AC
  Administered 2015-01-23: 30 mg via INTRAVENOUS
  Filled 2015-01-23: qty 1

## 2015-01-23 MED ORDER — SODIUM CHLORIDE 0.9 % IV BOLUS (SEPSIS)
1000.0000 mL | INTRAVENOUS | Status: AC
  Administered 2015-01-23: 1000 mL via INTRAVENOUS

## 2015-01-23 NOTE — ED Provider Notes (Signed)
CSN: [id number]     Arrival date & time 01/23/15  1312 History   First MD Initiated Contact with Patient 01/23/15 1524     Chief Complaint  Patient presents with  . Flank Pain     (Consider location/radiation/quality/duration/timing/severity/associated sxs/prior Treatment) Patient is a 44 y.o. female presenting with flank pain. The history is provided by the patient.  Flank Pain This is a new problem. The current episode started more than 2 days ago. The problem occurs constantly. The problem has not changed since onset.Associated symptoms include abdominal pain. Pertinent negatives include no chest pain and no shortness of breath. Nothing aggravates the symptoms. Nothing relieves the symptoms. She has tried nothing for the symptoms. The treatment provided no relief.    Past Medical History  Diagnosis Date  . Hypertension   . Asthma    Past Surgical History  Procedure Laterality Date  . Tubal ligation    . Cesarean section    . Fracture surgery    . Robotic assisted total hysterectomy N/A 03/05/2014    Procedure: ROBOTIC ASSISTED TOTAL HYSTERECTOMY/BILATERAL SALPINGECTOMY;  Surgeon: Kelly KyleSheronette A Cousins, MD;  Location: [facility];  Service: Gynecology;  Laterality: N/A;   No family history on file. History  Substance Use Topics  . Smoking status: Never Smoker   . Smokeless tobacco: Never Used  . Alcohol Use: Yes     Comment: occasion   OB History    No data available     Review of Systems  Constitutional: Positive for chills and fatigue. Negative for fever.  HENT: Negative for congestion and drooling.   Eyes: Negative for pain.  Respiratory: Negative for cough and shortness of breath.   Cardiovascular: Negative for chest pain.  Gastrointestinal: Positive for nausea, vomiting and abdominal pain. Negative for diarrhea.  Genitourinary: Positive for flank pain. Negative for dysuria and hematuria.  Musculoskeletal: Positive for myalgias. Negative for back pain, gait problem and  neck pain.  Skin: Negative for color change.  Neurological: Negative for dizziness and syncope.  Hematological: Negative for adenopathy.  Psychiatric/Behavioral: Negative for behavioral problems.  All other systems reviewed and are negative.     Allergies  Promethazine hcl and Sulfa antibiotics  Home Medications   Prior to Admission medications   Medication Sig Start Date End Date Taking? Authorizing Provider  albuterol (PROVENTIL HFA;VENTOLIN HFA) 108 (90 BASE) MCG/ACT inhaler Inhale 2 puffs into the lungs daily as needed for wheezing or shortness of breath. For shortness of breath   Yes Historical Provider, MD  amLODipine (NORVASC) 5 MG tablet Take 5 mg by mouth at bedtime.   Yes Historical Provider, MD  naproxen sodium (ANAPROX) 220 MG tablet Take 440 mg by mouth every 6 (six) hours as needed (for pain).   Yes Historical Provider, MD  zolpidem (AMBIEN) 5 MG tablet Take 5 mg by mouth at bedtime as needed for sleep.   Yes Historical Provider, MD  ibuprofen (ADVIL,MOTRIN) 800 MG tablet Take 1 tablet (800 mg total) by mouth every 8 (eight) hours as needed (mild pain). Patient not taking: Reported on 01/23/2015 03/06/14   Kelly BetterSheronette Cousins, MD  opium-belladonna (B&O SUPPRETTES) 16.2-60 MG suppository Place 1 suppository rectally every 6 (six) hours as needed for bladder spasms. Patient not taking: Reported on 01/23/2015 03/06/14   Kelly BetterSheronette Cousins, MD  oxyCODONE-acetaminophen (PERCOCET/ROXICET) 5-325 MG per tablet Take 1-2 tablets by mouth every 4 (four) hours as needed for severe pain (moderate to severe pain (when tolerating fluids)). Patient not taking: Reported on 01/23/2015 03/06/14  Kelly Cousins, MD   BP 120/81 mmHg  Pulse 115  Temp(Src) 98.7 F (37.1 C) (Oral)  Resp 20  SpO2 99%  LMP 03/05/2014 Physical Exam  Constitutional: She is oriented to person, place, and time. She appears well-developed and well-nourished.  HENT:  Head: Normocephalic.  Mouth/Throat: Oropharynx  is clear and moist. No oropharyngeal exudate.  Eyes: Conjunctivae and EOM are normal. Pupils are equal, round, and reactive to light.  Neck: Normal range of motion. Neck supple.  Cardiovascular: Normal rate, regular rhythm, normal heart sounds and intact distal pulses.  Exam reveals no gallop and no friction rub.   No murmur heard. Pulmonary/Chest: Effort normal and breath sounds normal. No respiratory distress. She has no wheezes.  Abdominal: Soft. Bowel sounds are normal. There is tenderness (mild ttp of LUQ). There is no rebound and no guarding.  Musculoskeletal: Normal range of motion. She exhibits no edema or tenderness.  Neurological: She is alert and oriented to person, place, and time.  Skin: Skin is warm and dry.  Psychiatric: She has a normal mood and affect. Her behavior is normal.  Nursing note and vitals reviewed.   ED Course  Procedures (including critical care time) Labs Review Labs Reviewed  CBC WITH DIFFERENTIAL/PLATELET - Abnormal; Notable for the following:    WBC 2.3 (*)    Platelets 77 (*)    Lymphs Abs 0.4 (*)    All other components within normal limits  COMPREHENSIVE METABOLIC PANEL - Abnormal; Notable for the following:    Potassium 3.4 (*)    Chloride 100 (*)    Glucose, Bld 106 (*)    Creatinine, Ser 1.01 (*)    AST 46 (*)    All other components within normal limits  LIPASE, BLOOD - Abnormal; Notable for the following:    Lipase 20 (*)    All other components within normal limits  URINALYSIS, ROUTINE W REFLEX MICROSCOPIC    Imaging Review Ct Abdomen Pelvis Wo Contrast  01/23/2015   CLINICAL DATA:  Left flank pain and vomiting for 2 days. History of urinary tract stones.  EXAM: CT ABDOMEN AND PELVIS WITHOUT CONTRAST  TECHNIQUE: Multidetector CT imaging of the abdomen and pelvis was performed following the standard protocol without IV contrast.  COMPARISON:  CT abdomen and pelvis 01/19/2014.  FINDINGS: The lung bases are clear. There is no pleural or  pericardial effusion. Heart size is normal.  No urinary tract stones are identified on the right or left. There is no hydronephrosis. The kidneys appear normal.  The liver, gallbladder, spleen, adrenal glands and pancreas appear normal. The patient is status post hysterectomy. The stomach, small and large bowel and appendix appear normal. There is no lymphadenopathy or fluid. No focal bony abnormality is identified.  IMPRESSION: No acute abnormality or finding to explain the patient's symptoms. Negative for urinary tract stone.  Status post hysterectomy.   Electronically Signed   By: Drusilla Kanner M.D.   On: 01/23/2015 16:22     EKG Interpretation None      MDM   Final diagnoses:  LUQ pain    3:56 PM 44 y.o. female w hx of HTN, ashtma, hysterectomy who presents with nausea, vomiting, and left-sided abdominal pain which began 3 days ago. She has had sweats, chills, myalgias as well. She denies any diarrhea, chest pain, shortness of breath. Pain seems to localize the left upper quadrant. Mildly tachycardic here, vital signs otherwise unremarkable. We'll get screening labs and imaging.  6:00 PM: Patient found to  have leukopenia and homicide opinion but normal red blood cells and hemoglobin. She states that she has a history of EtOH abuse and has been sober for about 60 days. Lower cell lines possibly related to the EtOH abuse. Lab work otherwise noncontributory. Imaging unremarkable. Possibly a viral syndrome. I have discussed the diagnosis/risks/treatment options with the patient and believe the pt to be eligible for discharge home to follow-up with her pcp as needed. We also discussed returning to the ED immediately if new or worsening sx occur. We discussed the sx which are most concerning (e.g., worsening pain, fever, inability to tolerate po) that necessitate immediate return. Medications administered to the patient during their visit and any new prescriptions provided to the patient are listed  below.  Medications given during this visit Medications  sodium chloride 0.9 % bolus 1,000 mL (1,000 mLs Intravenous New Bag/Given 01/23/15 1629)  ondansetron (ZOFRAN) injection 4 mg (4 mg Intravenous Given 01/23/15 1629)  ketorolac (TORADOL) 30 MG/ML injection 30 mg (30 mg Intravenous Given 01/23/15 1629)    New Prescriptions   ONDANSETRON (ZOFRAN ODT) 4 MG DISINTEGRATING TABLET    4mg  ODT q4 hours prn nausea/vomit   OXYCODONE-ACETAMINOPHEN (PERCOCET) 5-325 MG PER TABLET    Take 1 tablet by mouth every 6 (six) hours as needed for moderate pain.     Purvis SheffieldForrest Davina Howlett, MD 01/23/15 [id number]

## 2015-01-23 NOTE — Discharge Instructions (Signed)
Abdominal Pain, Women °Abdominal (stomach, pelvic, or belly) pain can be caused by many things. It is important to tell your doctor: °· The location of the pain. °· Does it come and go or is it present all the time? °· Are there things that start the pain (eating certain foods, exercise)? °· Are there other symptoms associated with the pain (fever, nausea, vomiting, diarrhea)? °All of this is helpful to know when trying to find the cause of the pain. °CAUSES  °· Stomach: virus or bacteria infection, or ulcer. °· Intestine: appendicitis (inflamed appendix), regional ileitis (Crohn's disease), ulcerative colitis (inflamed colon), irritable bowel syndrome, diverticulitis (inflamed diverticulum of the colon), or cancer of the stomach or intestine. °· Gallbladder disease or stones in the gallbladder. °· Kidney disease, kidney stones, or infection. °· Pancreas infection or cancer. °· Fibromyalgia (pain disorder). °· Diseases of the female organs: °¨ Uterus: fibroid (non-cancerous) tumors or infection. °¨ Fallopian tubes: infection or tubal pregnancy. °¨ Ovary: cysts or tumors. °¨ Pelvic adhesions (scar tissue). °¨ Endometriosis (uterus lining tissue growing in the pelvis and on the pelvic organs). °¨ Pelvic congestion syndrome (female organs filling up with blood just before the menstrual period). °¨ Pain with the menstrual period. °¨ Pain with ovulation (producing an egg). °¨ Pain with an IUD (intrauterine device, birth control) in the uterus. °¨ Cancer of the female organs. °· Functional pain (pain not caused by a disease, may improve without treatment). °· Psychological pain. °· Depression. °DIAGNOSIS  °Your doctor will decide the seriousness of your pain by doing an examination. °· Blood tests. °· X-rays. °· Ultrasound. °· CT scan (computed tomography, special type of X-ray). °· MRI (magnetic resonance imaging). °· Cultures, for infection. °· Barium enema (dye inserted in the large intestine, to better view it with  X-rays). °· Colonoscopy (looking in intestine with a lighted tube). °· Laparoscopy (minor surgery, looking in abdomen with a lighted tube). °· Major abdominal exploratory surgery (looking in abdomen with a large incision). °TREATMENT  °The treatment will depend on the cause of the pain.  °· Many cases can be observed and treated at home. °· Over-the-counter medicines recommended by your caregiver. °· Prescription medicine. °· Antibiotics, for infection. °· Birth control pills, for painful periods or for ovulation pain. °· Hormone treatment, for endometriosis. °· Nerve blocking injections. °· Physical therapy. °· Antidepressants. °· Counseling with a psychologist or psychiatrist. °· Minor or major surgery. °HOME CARE INSTRUCTIONS  °· Do not take laxatives, unless directed by your caregiver. °· Take over-the-counter pain medicine only if ordered by your caregiver. Do not take aspirin because it can cause an upset stomach or bleeding. °· Try a clear liquid diet (broth or water) as ordered by your caregiver. Slowly move to a bland diet, as tolerated, if the pain is related to the stomach or intestine. °· Have a thermometer and take your temperature several times a day, and record it. °· Bed rest and sleep, if it helps the pain. °· Avoid sexual intercourse, if it causes pain. °· Avoid stressful situations. °· Keep your follow-up appointments and tests, as your caregiver orders. °· If the pain does not go away with medicine or surgery, you may try: °¨ Acupuncture. °¨ Relaxation exercises (yoga, meditation). °¨ Group therapy. °¨ Counseling. °SEEK MEDICAL CARE IF:  °· You notice certain foods cause stomach pain. °· Your home care treatment is not helping your pain. °· You need stronger pain medicine. °· You want your IUD removed. °· You feel faint or   lightheaded. °· You develop nausea and vomiting. °· You develop a rash. °· You are having side effects or an allergy to your medicine. °SEEK IMMEDIATE MEDICAL CARE IF:  °· Your  pain does not go away or gets worse. °· You have a fever. °· Your pain is felt only in portions of the abdomen. The right side could possibly be appendicitis. The left lower portion of the abdomen could be colitis or diverticulitis. °· You are passing blood in your stools (bright red or black tarry stools, with or without vomiting). °· You have blood in your urine. °· You develop chills, with or without a fever. °· You pass out. °MAKE SURE YOU:  °· Understand these instructions. °· Will watch your condition. °· Will get help right away if you are not doing well or get worse. °Document Released: 06/21/2007 Document Revised: 01/08/2014 Document Reviewed: 07/11/2009 °[employer]® Patient Information ©2015 [employer]. This information is not intended to replace advice given to you by your health care provider. Make sure you discuss any questions you have with your health care provider. ° °

## 2015-01-23 NOTE — ED Notes (Signed)
Pt presents with c/o left side flank pain. Pt reports no hx of kidney stones. Pt denies any burning with urination or blood in her urine. Pt reports vomiting for the past 2 days.

## 2016-09-06 ENCOUNTER — Encounter (HOSPITAL_COMMUNITY): Payer: Self-pay | Admitting: *Deleted

## 2016-09-06 ENCOUNTER — Emergency Department (HOSPITAL_COMMUNITY)
Admission: EM | Admit: 2016-09-06 | Discharge: 2016-09-06 | Disposition: A | Discharge status: Home / Self Care | Payer: 59 | Attending: Emergency Medicine | Admitting: Emergency Medicine

## 2016-09-06 DIAGNOSIS — R1010 Upper abdominal pain, unspecified: Secondary | ICD-10-CM

## 2016-09-06 DIAGNOSIS — I10 Essential (primary) hypertension: Secondary | ICD-10-CM | POA: Diagnosis not present

## 2016-09-06 DIAGNOSIS — Z79899 Other long term (current) drug therapy: Secondary | ICD-10-CM | POA: Insufficient documentation

## 2016-09-06 DIAGNOSIS — R1084 Generalized abdominal pain: Secondary | ICD-10-CM | POA: Insufficient documentation

## 2016-09-06 DIAGNOSIS — R0789 Other chest pain: Secondary | ICD-10-CM | POA: Diagnosis not present

## 2016-09-06 DIAGNOSIS — J45909 Unspecified asthma, uncomplicated: Secondary | ICD-10-CM | POA: Insufficient documentation

## 2016-09-06 LAB — COMPREHENSIVE METABOLIC PANEL
ALK PHOS: 73 U/L (ref 38–126)
ALT: 38 U/L (ref 14–54)
AST: 65 U/L — ABNORMAL HIGH (ref 15–41)
Albumin: 3.9 g/dL (ref 3.5–5.0)
Anion gap: 12 (ref 5–15)
BILIRUBIN TOTAL: 0.8 mg/dL (ref 0.3–1.2)
BUN: 11 mg/dL (ref 6–20)
CALCIUM: 9 mg/dL (ref 8.9–10.3)
CHLORIDE: 102 mmol/L (ref 101–111)
CO2: 24 mmol/L (ref 22–32)
Creatinine, Ser: 0.93 mg/dL (ref 0.44–1.00)
GFR calc non Af Amer: >60 mL/min (ref >60)
Glucose, Bld: 91 mg/dL (ref 65–99)
Potassium: 3.5 mmol/L (ref 3.5–5.1)
Sodium: 138 mmol/L (ref 135–145)
Total Protein: 7.3 g/dL (ref 6.5–8.1)

## 2016-09-06 LAB — URINALYSIS, ROUTINE W REFLEX MICROSCOPIC
Bilirubin Urine: NEGATIVE
GLUCOSE, UA: NEGATIVE mg/dL
HGB URINE DIPSTICK: NEGATIVE
Ketones, ur: NEGATIVE mg/dL
Leukocytes, UA: NEGATIVE
Nitrite: NEGATIVE
PROTEIN: NEGATIVE mg/dL
Specific Gravity, Urine: 1.005 (ref 1.005–1.030)
pH: 7 (ref 5.0–8.0)

## 2016-09-06 LAB — CBC
HCT: 40.8 % (ref 36.0–46.0)
Hemoglobin: 14.5 g/dL (ref 12.0–15.0)
MCH: 32.4 pg (ref 26.0–34.0)
MCHC: 35.5 g/dL (ref 30.0–36.0)
MCV: 91.1 fL (ref 78.0–100.0)
Platelets: 185 10*3/uL (ref 150–400)
RBC: 4.48 MIL/uL (ref 3.87–5.11)
RDW: 13.3 % (ref 11.5–15.5)
WBC: 3.3 10*3/uL — AB (ref 4.0–10.5)

## 2016-09-06 LAB — POC URINE PREG, ED: Preg Test, Ur: NEGATIVE

## 2016-09-06 LAB — LIPASE, BLOOD: LIPASE: 27 U/L (ref 11–51)

## 2016-09-06 LAB — POC OCCULT BLOOD, ED: Fecal Occult Bld: NEGATIVE

## 2016-09-06 LAB — ETHANOL: ALCOHOL ETHYL (B): 126 mg/dL — AB (ref <5)

## 2016-09-06 MED ORDER — FAMOTIDINE 20 MG PO TABS
20.0000 mg | ORAL_TABLET | Freq: Once | ORAL | Status: AC
  Administered 2016-09-06: 20 mg via ORAL
  Filled 2016-09-06: qty 1

## 2016-09-06 MED ORDER — SODIUM CHLORIDE 0.9 % IV BOLUS (SEPSIS)
1000.0000 mL | Freq: Once | INTRAVENOUS | Status: AC
  Administered 2016-09-06: 1000 mL via INTRAVENOUS

## 2016-09-06 MED ORDER — CHLORDIAZEPOXIDE HCL 25 MG PO CAPS: ORAL_CAPSULE | ORAL | 0 refills | Status: DC

## 2016-09-06 MED ORDER — ONDANSETRON HCL 4 MG/2ML IJ SOLN
4.0000 mg | Freq: Once | INTRAMUSCULAR | Status: AC
  Administered 2016-09-06: 4 mg via INTRAVENOUS
  Filled 2016-09-06: qty 2

## 2016-09-06 NOTE — ED Triage Notes (Signed)
The pt is c/o abd pain for 4 months with intermittent chest pain weight loss for 4 months  Nausea  This am  But she saw some blood in her stool  Normal blood color ????

## 2016-09-06 NOTE — ED Provider Notes (Signed)
[facility]-EMERGENCY DEPT Provider Note   CSN: [id number] Arrival date & time: 09/06/16  1525     History   Chief Complaint Chief Complaint  Patient presents with  . Abdominal Pain    HPI Kelly Walton is a 45 y.o. female who presents with intermittent chest pain, upper abdominal pain, N/V, and unintentional weight loss. PMH significant for alcohol abuse, cocaine abuse, asthma, HTN. The patient states she has drank alcohol heavily since she was in her 1520s. She tried to start cutting back ~4 months ago. Since then she has had intermittent right sided chest pain, and has constant epigastric, RUQ, and LUQ pain. She states she has N/V daily often several times a day. She had a decreased appetite and has lost ~20 pounds in the last month and a half. No hematemesis. She has had diarrhea as well for the past month. Today she had an hematochezia which scared her therefore she came to the ED. She denies drinking alchol today but drank heavily last night. She has also continued to use cocaine. She reports constant chills and fatigue. She denies fever, URI symptoms, SOB, cough, lower abdominal pain, pelvic pain, constipation, melena, urinary symptoms, vaginal symptoms, seizure activity. She has seen her PCP 2 weeks ago who did blood work which was essentially unremarkable. She desires to stop drinking and got to detox/rehab  HPI  Past Medical History:  Diagnosis Date  . Asthma   . Hypertension     Patient Active Problem List   Diagnosis Date Noted  . S/P hysterectomy 03/05/2014    Past Surgical History:  Procedure Laterality Date  . CESAREAN SECTION    . FRACTURE SURGERY    . ROBOTIC ASSISTED TOTAL HYSTERECTOMY N/A 03/05/2014   Procedure: ROBOTIC ASSISTED TOTAL HYSTERECTOMY/BILATERAL SALPINGECTOMY;  Surgeon: Serita KyleSheronette A Cousins, MD;  Location: [facility];  Service: Gynecology;  Laterality: N/A;  . TUBAL LIGATION      OB History    No data available       Home Medications    Prior to  Admission medications   Medication Sig Start Date End Date Taking? Authorizing Provider  albuterol (PROVENTIL HFA;VENTOLIN HFA) 108 (90 BASE) MCG/ACT inhaler Inhale 2 puffs into the lungs daily as needed for wheezing or shortness of breath. For shortness of breath   Yes Historical Provider, MD  amLODipine (NORVASC) 10 MG tablet Take 10 mg by mouth every evening. 07/16/16  Yes Historical Provider, MD  montelukast (SINGULAIR) 10 MG tablet Take 10 mg by mouth at bedtime.   Yes Historical Provider, MD  ondansetron (ZOFRAN ODT) 4 MG disintegrating tablet 4mg  ODT q4 hours prn nausea/vomit 01/23/15  Yes Purvis SheffieldForrest Harrison, MD  Probiotic CAPS Take 1 capsule by mouth daily. 08/26/16 08/26/17 Yes Historical Provider, MD  zolpidem (AMBIEN) 5 MG tablet Take 5 mg by mouth at bedtime as needed for sleep.   Yes Historical Provider, MD    Family History No family history on file.  Social History Social History  Substance Use Topics  . Smoking status: Never Smoker  . Smokeless tobacco: Never Used  . Alcohol use Yes     Comment: occasion     Allergies   Promethazine hcl and Sulfa antibiotics   Review of Systems Review of Systems  Constitutional: Positive for appetite change, chills, fatigue and unexpected weight change. Negative for fever.  Respiratory: Negative for cough and shortness of breath.   Cardiovascular: Positive for chest pain. Negative for palpitations and leg swelling.  Gastrointestinal: Positive for abdominal pain,  diarrhea, nausea and vomiting. Negative for constipation.  Genitourinary: Negative for dysuria and flank pain.  Neurological: Negative for seizures.  All other systems reviewed and are negative.    Physical Exam Updated Vital Signs BP 129/96   Pulse 102   Temp 97.7 F (36.5 C)   Resp 18   Ht 5\' 6"  (1.676 m)   Wt 46.3 kg   LMP 03/05/2014   SpO2 98%   BMI 16.48 kg/m   Physical Exam  Constitutional: She is oriented to person, place, and time. She appears  well-developed and well-nourished. No distress.  Thin, pleasant, NAD  HENT:  Head: Normocephalic and atraumatic.  Eyes: Conjunctivae are normal. Pupils are equal, round, and reactive to light. Right eye exhibits no discharge. Left eye exhibits no discharge. No scleral icterus.  Neck: Normal range of motion.  Cardiovascular: Normal heart sounds and intact distal pulses.  Tachycardia present.  Exam reveals no gallop and no friction rub.   No murmur heard. Pulmonary/Chest: Effort normal and breath sounds normal. No respiratory distress. She has no wheezes. She has no rales. She exhibits no tenderness.  Abdominal: Soft. Bowel sounds are normal. She exhibits no distension and no mass. There is tenderness. There is no rebound and no guarding. No hernia.  Generalized tenderness. Mostly tender in epigastric area  Genitourinary:  Genitourinary Comments: Rectal: No gross blood, hemorrhoids, fissures, redness, area of fluctuance, lesions, or tenderness. Chaperone present during exam.   Neurological: She is alert and oriented to person, place, and time.  Skin: Skin is warm and dry.  Psychiatric: She has a normal mood and affect. Her behavior is normal.  Nursing note and vitals reviewed.    ED Treatments / Results  Labs (all labs ordered are listed, but only abnormal results are displayed) Labs Reviewed  COMPREHENSIVE METABOLIC PANEL - Abnormal; Notable for the following:       Result Value   AST 65 (*)    All other components within normal limits  CBC - Abnormal; Notable for the following:    WBC 3.3 (*)    All other components within normal limits  URINALYSIS, ROUTINE W REFLEX MICROSCOPIC - Abnormal; Notable for the following:    Color, Urine STRAW (*)    All other components within normal limits  ETHANOL - Abnormal; Notable for the following:    Alcohol, Ethyl (B) 126 (*)    All other components within normal limits  LIPASE, BLOOD  POC OCCULT BLOOD, ED  POC URINE PREG, ED    EKG   EKG Interpretation None       Radiology No results found.  Procedures Procedures (including critical care time)  Medications Ordered in ED Medications  sodium chloride 0.9 % bolus 1,000 mL (0 mLs Intravenous Stopped 09/06/16 2033)  ondansetron (ZOFRAN) injection 4 mg (4 mg Intravenous Given 09/06/16 1843)  famotidine (PEPCID) tablet 20 mg (20 mg Oral Given 09/06/16 1843)     Initial Impression / Assessment and Plan / ED Course  I have reviewed the triage vital signs and the nursing notes.  Pertinent labs & imaging results that were available during my care of the patient were reviewed by me and considered in my medical decision making (see chart for details).  Clinical Course    45 year old female with likely substance abuse induced chest pain and epigastric pain. She is mildly hypertensive and tachycardic. Otherwise vitals are normal. Labs are overall unremarkable other than mild elevation of AST when compared to previous. Rectal exam  reveals no gross blood and is hemoccult negative. UA clean. EKG is sinus tachycardia with peaked P waves. IVF, Zofran, Pepcid given. She is tolerating PO.  Discussed ETOH and cocaine cessation. Patient is agreeable to Librium taper. Substance abuse rehab resources provided. Patient is NAD, non-toxic, with stable VS. Patient is informed of clinical course, understands medical decision making process, and agrees with plan. Opportunity for questions provided and all questions answered. Return precautions given.   Final Clinical Impressions(s) / ED Diagnoses   Final diagnoses:  Pain of upper abdomen    New Prescriptions Discharge Medication List as of 09/06/2016  8:42 PM    START taking these medications   Details  chlordiazePOXIDE (LIBRIUM) 25 MG capsule 50mg  PO TID x 1D, then 25-50mg  PO BID X 1D, then 25-50mg  PO QD X 1D, Print         Bethel Born, PA-C 09/07/16 1615    Marily Memos, MD 09/08/16 1152

## 2016-09-06 NOTE — ED Notes (Signed)
Pt unable to provide urine specimen at this time

## 2016-09-06 NOTE — Discharge Instructions (Signed)
Do not drink while taking Librium Please follow up with detox/rehab and your PCP

## 2017-02-26 ENCOUNTER — Emergency Department (HOSPITAL_COMMUNITY): Payer: 59

## 2017-02-26 ENCOUNTER — Encounter (HOSPITAL_COMMUNITY): Payer: Self-pay

## 2017-02-26 DIAGNOSIS — R0602 Shortness of breath: Secondary | ICD-10-CM | POA: Diagnosis not present

## 2017-02-26 DIAGNOSIS — R079 Chest pain, unspecified: Secondary | ICD-10-CM | POA: Diagnosis not present

## 2017-02-26 DIAGNOSIS — R0789 Other chest pain: Secondary | ICD-10-CM | POA: Diagnosis not present

## 2017-02-26 DIAGNOSIS — R000 Tachycardia, unspecified: Secondary | ICD-10-CM | POA: Diagnosis not present

## 2017-02-26 LAB — CBC
HCT: 38.3 % (ref 36.0–46.0)
Hemoglobin: 12.7 g/dL (ref 12.0–15.0)
MCH: 30.5 pg (ref 26.0–34.0)
MCHC: 33.2 g/dL (ref 30.0–36.0)
MCV: 91.8 fL (ref 78.0–100.0)
PLATELETS: 218 10*3/uL (ref 150–400)
RBC: 4.17 MIL/uL (ref 3.87–5.11)
RDW: 12.7 % (ref 11.5–15.5)
WBC: 7.8 10*3/uL (ref 4.0–10.5)

## 2017-02-26 LAB — BASIC METABOLIC PANEL
Anion gap: 9 (ref 5–15)
BUN: 8 mg/dL (ref 6–20)
CALCIUM: 9.2 mg/dL (ref 8.9–10.3)
CO2: 25 mmol/L (ref 22–32)
Chloride: 104 mmol/L (ref 101–111)
Creatinine, Ser: 0.95 mg/dL (ref 0.44–1.00)
GFR calc Af Amer: >60 mL/min (ref >60)
GFR calc non Af Amer: >60 mL/min (ref >60)
Glucose, Bld: 126 mg/dL — ABNORMAL HIGH (ref 65–99)
Potassium: 3.2 mmol/L — ABNORMAL LOW (ref 3.5–5.1)
SODIUM: 138 mmol/L (ref 135–145)

## 2017-02-26 LAB — I-STAT TROPONIN, ED: TROPONIN I, POC: 0 ng/mL (ref 0.00–0.08)

## 2017-02-26 NOTE — ED Triage Notes (Signed)
Pt complaining of L chest pain that radiates to L arm x 1 hour. Pt also complaining of SOB and dizziness.

## 2017-02-27 ENCOUNTER — Emergency Department (HOSPITAL_COMMUNITY)
Admission: EM | Admit: 2017-02-27 | Discharge: 2017-02-27 | Disposition: A | Discharge status: Home / Self Care | Payer: 59 | Attending: Emergency Medicine | Admitting: Emergency Medicine

## 2017-02-27 NOTE — ED Notes (Signed)
Pt called for vital recheck with no answer 

## 2017-02-27 NOTE — ED Notes (Signed)
Pt called back to room with no response.

## 2017-04-15 ENCOUNTER — Ambulatory Visit: Payer: 59 | Admitting: Primary Care

## 2017-04-16 ENCOUNTER — Ambulatory Visit (INDEPENDENT_AMBULATORY_CARE_PROVIDER_SITE_OTHER): Payer: 59 | Admitting: Primary Care

## 2017-04-16 ENCOUNTER — Encounter: Payer: Self-pay | Admitting: Primary Care

## 2017-04-16 VITALS — BP 148/94 | HR 108 | Temp 98.2°F | Ht 63.75 in | Wt 109.0 lb

## 2017-04-16 DIAGNOSIS — I10 Essential (primary) hypertension: Secondary | ICD-10-CM | POA: Diagnosis not present

## 2017-04-16 DIAGNOSIS — G4700 Insomnia, unspecified: Secondary | ICD-10-CM

## 2017-04-16 DIAGNOSIS — M79673 Pain in unspecified foot: Secondary | ICD-10-CM | POA: Insufficient documentation

## 2017-04-16 DIAGNOSIS — M79672 Pain in left foot: Secondary | ICD-10-CM

## 2017-04-16 DIAGNOSIS — M79671 Pain in right foot: Secondary | ICD-10-CM

## 2017-04-16 DIAGNOSIS — Z79899 Other long term (current) drug therapy: Secondary | ICD-10-CM | POA: Diagnosis not present

## 2017-04-16 MED ORDER — ZOLPIDEM TARTRATE 5 MG PO TABS: 5.0000 mg | ORAL_TABLET | Freq: Every evening | ORAL | 0 refills | Status: DC | PRN

## 2017-04-16 NOTE — Assessment & Plan Note (Signed)
Long history of, does well on Ambien. OTC over thee past 2-3 months has been ineffective. Will refill Ambien. UDS and controlled substance contract obtained today.

## 2017-04-16 NOTE — Assessment & Plan Note (Signed)
Long history of, worse during increased consumption of alcohol in 2016-2017. BP above goal today. Endorses this has improved since changing her diet. Will have her start monitoring home BP readings. We will call her for readings in 2 weeks. Consider adding lisinopril if home readings are high.

## 2017-04-16 NOTE — Assessment & Plan Note (Signed)
Suspect plantar fasciitis. She has tried most all conservative measures. Will have her work on stretching exercises with a soup can.  Referral placed for podiatry.

## 2017-04-16 NOTE — Progress Notes (Signed)
Subjective:    Patient ID: Kelly Walton, female    DOB: 1971/03/22, 46 y.o.   MRN: [id number]  HPI  Kelly Walton is a 46 year old female who presents today to establish care and discuss the problems mentioned below. Will obtain old records. Her last physical.   1) Alcohol Abuse: History of since the age of 46. Over the past several years her alcoholism has become worse, especially during 2016-2017. Overall doing better. Drinks 2-3 beers on the weekends, sometimes does not drink.  2) Essential Hypertension: Currently managed on Amlodipine 10 mg. BP in the office today is 148/94. She has recently cut back on her sodium, alcohol, and caffeine consumption. She denies chest pain, dizziness, shortness of breath. She did have a hard time with hypertension when alcohol use was higher.  3) Insomnia: Currently managed on Ambien 5 mg for which she's taken for years. She ran out of her Ambien 2-3 months ago. She's been taking OTC medications without improvements. Difficulty falling and staying sleep. She's tried all improvements in sleep hygiene without improvement.   4) Asthma: Currently managed on albuterol inhaler PRN and Singulair 10 mg. She has not used her albuterol inhaler in several months. Feels well managed on Singulair.   5) Foot Pain: Located to bilateral plantar feet that has been present for the past 2 months. She's tried switching shoes, wearing special insoles and taking Ibuprofen without improvement. She's on her feet for 8-10 hours daily for 5 days weekly.     Review of Systems  Constitutional: Negative for fatigue.  Eyes: Negative for visual disturbance.  Respiratory: Negative for shortness of breath and wheezing.   Cardiovascular: Negative for chest pain and leg swelling.  Musculoskeletal:       Bilateral plantar foot pain  Neurological: Negative for dizziness and headaches.  Psychiatric/Behavioral: Positive for sleep disturbance.       Past Medical History:  Diagnosis Date   . Asthma   . Hypertension      Social History   Social History  . Marital status: Single    Spouse name: N/A  . Number of children: N/A  . Years of education: N/A   Occupational History  . Not on file.   Social History Main Topics  . Smoking status: Never Smoker  . Smokeless tobacco: Never Used  . Alcohol use Yes     Comment: occasion  . Drug use: No  . Sexual activity: Not on file   Other Topics Concern  . Not on file   Social History Narrative   Single.   2 children, 1 step-son.   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     Past Surgical History:  Procedure Laterality Date  . CESAREAN SECTION    . FRACTURE SURGERY    . ROBOTIC ASSISTED TOTAL HYSTERECTOMY N/A 03/05/2014   Procedure: ROBOTIC ASSISTED TOTAL HYSTERECTOMY/BILATERAL SALPINGECTOMY;  Surgeon: Serita KyleSheronette A Cousins, MD;  Location: [facility];  Service: Gynecology;  Laterality: N/A;  . TUBAL LIGATION      Family History  Problem Relation Age of Onset  . Arthritis Mother   . Arthritis Father   . Lung cancer Father   . Hyperlipidemia Maternal Grandmother   . Stroke Maternal Grandmother   . Hypertension Maternal Grandmother   . Alcohol abuse Paternal Grandmother   . Arthritis Paternal Grandmother   . Mental illness Paternal Grandmother     Allergies  Allergen Reactions  . Promethazine Hcl Hives  and Rash  . Sulfa Antibiotics Hives and Rash    Current Outpatient Prescriptions on File Prior to Visit  Medication Sig Dispense Refill  . amLODipine (NORVASC) 10 MG tablet Take 10 mg by mouth every evening.    . montelukast (SINGULAIR) 10 MG tablet Take 10 mg by mouth at bedtime.    Marland Kitchen albuterol (PROVENTIL HFA;VENTOLIN HFA) 108 (90 BASE) MCG/ACT inhaler Inhale 2 puffs into the lungs daily as needed for wheezing or shortness of breath. For shortness of breath     No current facility-administered medications on file prior to visit.     BP (!) 148/94   Pulse (!) 108   Temp 98.2  F (36.8 C) (Oral)   Ht 5' 3.75" (1.619 m)   Wt 109 lb (49.4 kg)   LMP 03/05/2014   SpO2 99%   BMI 18.86 kg/m    Objective:   Physical Exam  Constitutional: She appears well-nourished.  Neck: Neck supple.  Cardiovascular: Normal rate and regular rhythm.   Pulmonary/Chest: Effort normal and breath sounds normal.  Skin: Skin is warm and dry.  Psychiatric: She has a normal mood and affect.          Assessment & Plan:

## 2017-04-16 NOTE — Patient Instructions (Addendum)
Your blood pressure is too high. It should run less than 135/90.  Check your blood pressure daily, around the same time of day, for the next 2 weeks.  Ensure that you have rested for 30 minutes prior to checking your blood pressure. Record your readings and I'll call you for those readings in 2 weeks.  You will be contacted regarding your referral to Podiatry.  Please let us know if you have not heard back within one week.   Please schedule a physical with me before the end of the year. You may also schedule a lab only appointment 3-4 days prior. We will discuss your lab results in detail during your physical.  It was a pleasure to meet you today! Please don't hesitate to call me with any questions. Welcome to [facility]!   Hypertension Hypertension, commonly called high blood pressure, is when the force of blood pumping through the arteries is too strong. The arteries are the blood vessels that carry blood from the heart throughout the body. Hypertension forces the heart to work harder to pump blood and may cause arteries to become narrow or stiff. Having untreated or uncontrolled hypertension can cause heart attacks, strokes, kidney disease, and other problems. A blood pressure reading consists of a higher number over a lower number. Ideally, your blood pressure should be below 120/80. The first ("top") number is called the systolic pressure. It is a measure of the pressure in your arteries as your heart beats. The second ("bottom") number is called the diastolic pressure. It is a measure of the pressure in your arteries as the heart relaxes. What are the causes? The cause of this condition is not known. What increases the risk? Some risk factors for high blood pressure are under your control. Others are not. Factors you can change  Smoking.  Having type 2 diabetes mellitus, high cholesterol, or both.  Not getting enough exercise or physical activity.  Being overweight.  Having too much  fat, sugar, calories, or salt (sodium) in your diet.  Drinking too much alcohol. Factors that are difficult or impossible to change  Having chronic kidney disease.  Having a family history of high blood pressure.  Age. Risk increases with age.  Race. You may be at higher risk if you are African-American.  Gender. Men are at higher risk than women before age 80. After age 22, women are at higher risk than men.  Having obstructive sleep apnea.  Stress. What are the signs or symptoms? Extremely high blood pressure (hypertensive crisis) may cause:  Headache.  Anxiety.  Shortness of breath.  Nosebleed.  Nausea and vomiting.  Severe chest pain.  Jerky movements you cannot control (seizures).  How is this diagnosed? This condition is diagnosed by measuring your blood pressure while you are seated, with your arm resting on a surface. The cuff of the blood pressure monitor will be placed directly against the skin of your upper arm at the level of your heart. It should be measured at least twice using the same arm. Certain conditions can cause a difference in blood pressure between your right and left arms. Certain factors can cause blood pressure readings to be lower or higher than normal (elevated) for a short period of time:  When your blood pressure is higher when you are in a health care provider's office than when you are at home, this is called white coat hypertension. Most people with this condition do not need medicines.  When your blood pressure is higher  at home than when you are in a health care provider's office, this is called masked hypertension. Most people with this condition may need medicines to control blood pressure.  If you have a high blood pressure reading during one visit or you have normal blood pressure with other risk factors:  You may be asked to return on a different day to have your blood pressure checked again.  You may be asked to monitor your  blood pressure at home for 1 week or longer.  If you are diagnosed with hypertension, you may have other blood or imaging tests to help your health care provider understand your overall risk for other conditions. How is this treated? This condition is treated by making healthy lifestyle changes, such as eating healthy foods, exercising more, and reducing your alcohol intake. Your health care provider may prescribe medicine if lifestyle changes are not enough to get your blood pressure under control, and if:  Your systolic blood pressure is above 130.  Your diastolic blood pressure is above 80.  Your personal target blood pressure may vary depending on your medical conditions, your age, and other factors. Follow these instructions at home: Eating and drinking  Eat a diet that is high in fiber and potassium, and low in sodium, added sugar, and fat. An example eating plan is called the DASH (Dietary Approaches to Stop Hypertension) diet. To eat this way: ? Eat plenty of fresh fruits and vegetables. Try to fill half of your plate at each meal with fruits and vegetables. ? Eat whole grains, such as whole wheat pasta, brown rice, or whole grain bread. Fill about one quarter of your plate with whole grains. ? Eat or drink low-fat dairy products, such as skim milk or low-fat yogurt. ? Avoid fatty cuts of meat, processed or cured meats, and poultry with skin. Fill about one quarter of your plate with lean proteins, such as fish, chicken without skin, beans, eggs, and tofu. ? Avoid premade and processed foods. These tend to be higher in sodium, added sugar, and fat.  Reduce your daily sodium intake. Most people with hypertension should eat less than 1,500 mg of sodium a day.  Limit alcohol intake to no more than 1 drink a day for nonpregnant women and 2 drinks a day for men. One drink equals 12 oz of beer, 5 oz of wine, or 1 oz of hard liquor. Lifestyle  Work with your health care provider to  maintain a healthy body weight or to lose weight. Ask what an ideal weight is for you.  Get at least 30 minutes of exercise that causes your heart to beat faster (aerobic exercise) most days of the week. Activities may include walking, swimming, or biking.  Include exercise to strengthen your muscles (resistance exercise), such as pilates or lifting weights, as part of your weekly exercise routine. Try to do these types of exercises for 30 minutes at least 3 days a week.  Do not use any products that contain nicotine or tobacco, such as cigarettes and e-cigarettes. If you need help quitting, ask your health care provider.  Monitor your blood pressure at home as told by your health care provider.  Keep all follow-up visits as told by your health care provider. This is important. Medicines  Take over-the-counter and prescription medicines only as told by your health care provider. Follow directions carefully. Blood pressure medicines must be taken as prescribed.  Do not skip doses of blood pressure medicine. Doing this puts you  at risk for problems and can make the medicine less effective.  Ask your health care provider about side effects or reactions to medicines that you should watch for. Contact a health care provider if:  You think you are having a reaction to a medicine you are taking.  You have headaches that keep coming back (recurring).  You feel dizzy.  You have swelling in your ankles.  You have trouble with your vision. Get help right away if:  You develop a severe headache or confusion.  You have unusual weakness or numbness.  You feel faint.  You have severe pain in your chest or abdomen.  You vomit repeatedly.  You have trouble breathing. Summary  Hypertension is when the force of blood pumping through your arteries is too strong. If this condition is not controlled, it may put you at risk for serious complications.  Your personal target blood pressure may  vary depending on your medical conditions, your age, and other factors. For most people, a normal blood pressure is less than 120/80.  Hypertension is treated with lifestyle changes, medicines, or a combination of both. Lifestyle changes include weight loss, eating a healthy, low-sodium diet, exercising more, and limiting alcohol. This information is not intended to replace advice given to you by your health care provider. Make sure you discuss any questions you have with your health care provider. Document Released: 08/24/2005 Document Revised: 07/22/2016 Document Reviewed: 07/22/2016 Elsevier Interactive Patient Education  [employer]2018 [employer]

## 2017-05-19 ENCOUNTER — Ambulatory Visit (INDEPENDENT_AMBULATORY_CARE_PROVIDER_SITE_OTHER): Payer: 59

## 2017-05-19 ENCOUNTER — Encounter: Payer: Self-pay | Admitting: Podiatry

## 2017-05-19 ENCOUNTER — Telehealth: Payer: Self-pay | Admitting: Podiatry

## 2017-05-19 ENCOUNTER — Ambulatory Visit (INDEPENDENT_AMBULATORY_CARE_PROVIDER_SITE_OTHER): Payer: 59 | Admitting: Podiatry

## 2017-05-19 ENCOUNTER — Telehealth: Payer: Self-pay | Admitting: *Deleted

## 2017-05-19 VITALS — BP 126/85 | HR 100

## 2017-05-19 DIAGNOSIS — M79673 Pain in unspecified foot: Secondary | ICD-10-CM

## 2017-05-19 DIAGNOSIS — M205X9 Other deformities of toe(s) (acquired), unspecified foot: Secondary | ICD-10-CM | POA: Diagnosis not present

## 2017-05-19 DIAGNOSIS — R52 Pain, unspecified: Secondary | ICD-10-CM | POA: Diagnosis not present

## 2017-05-19 MED ORDER — DICLOFENAC SODIUM 75 MG PO TBEC: 75.0000 mg | DELAYED_RELEASE_TABLET | Freq: Two times a day (BID) | ORAL | 0 refills | Status: DC

## 2017-05-19 MED ORDER — MELOXICAM 15 MG PO TABS: 15.0000 mg | ORAL_TABLET | Freq: Every day | ORAL | 0 refills | Status: DC

## 2017-05-19 NOTE — Telephone Encounter (Signed)
I'm calling in regards to the medication that was prescribed for me today. There is something in it that I'm allergic to so I was wanting to know if you could prescribe me something else. Please call either me back at [phone] or call my pharmacy which [facility] on [address]. Thank you.

## 2017-05-19 NOTE — Progress Notes (Signed)
   Subjective:    Patient ID: Kelly Walton, female    DOB: April 18, 1971, 46 y.o.   MRN: [id number]  HPI. This patient presents to the office with chief complaint of pain in both feet.  She states that the pain increases during the day in which she stands over 10 hours a day at work.  She has been experiencing this pain and swelling for months. She says she feels pain all over and she  points to the center of her arch on her left foot. She says she has acquired Dr. Margart SicklesScholl's inserts but they have not been effective in resolving her pain.  Patient does have evidence of bunions big toe joints both feet.  She presents the office today for an evaluation and treatment of her painful feet.      Review of Systems  Constitutional: Positive for activity change, appetite change and fatigue.  Eyes: Positive for pain and itching.  Cardiovascular: Positive for chest pain and leg swelling.       Calf pain with walking  Gastrointestinal: Positive for abdominal pain, blood in stool, diarrhea and vomiting.  Endocrine: Positive for polydipsia and polyuria.  Musculoskeletal: Positive for gait problem.       Joint pain  Skin: Positive for rash.  Neurological: Positive for weakness and numbness.  Hematological: Bruises/bleeds easily.       Objective:   Physical Exam GENERAL APPEARANCE: Alert, conversant. Appropriately groomed. No acute distress.  VASCULAR: Pedal pulses are  palpable at  [facility] and PT bilateral.  Capillary refill time is immediate to all digits,  Normal temperature gradient.  Digital hair growth is present bilateral  NEUROLOGIC: sensation is normal to 5.07 monofilament at 5/5 sites bilateral.  Light touch is intact bilateral, Muscle strength normal.  MUSCULOSKELETAL: acceptable muscle strength, tone and stability bilateral.  Intrinsic muscluature intact bilateral.  HAV  B/L with dorsal spurring first metatarsal head  B/L. Limited ROM 1st MPJ  B/L upon loading her foot.  Midfoot  DJD 1st MCJ  Left  foot. Mild discomfort plantar fascia  B/L. Guarding left foot noted.    DERMATOLOGIC: skin color, texture, and turgor are within normal limits.  No preulcerative lesions or ulcers  are seen, no interdigital maceration noted.  No open lesions present.  Digital nails are asymptomatic. No drainage noted.        Assessment & Plan:  Functional hallux limitus  B/L  Plantar fascitis  B/L  Initial exam.  X-rays taken reveal a long and elevated first metatarsal bilaterally  . Mild HAV deformity noted.  Discussed her foot pain with this patient.  Instructed this patient to wear power step insoles as well as prescribed Mobic by mouth.  Discussed proper footgear with this patient. Told patient she is a candidate for kinetic wedge orthotics if she can tolerate the power step insoles.  Return to the clinic in 3 weeks for further evaluation and treatment    Helane GuntherGregory Adasyn Mcadams DPM

## 2017-05-19 NOTE — Telephone Encounter (Signed)
Pt called saying the medication was sent to the right pharmacy but the wrong address. [facility] [address]

## 2017-05-19 NOTE — Telephone Encounter (Signed)
I informed pt the Mel[facility] had [facility], she had requested.

## 2017-05-19 NOTE — Telephone Encounter (Signed)
I informed pt the rx had been [facility].

## 2017-05-19 NOTE — Telephone Encounter (Addendum)
Pt states the meloxicam contains sulfa and she is allergic. Dr. Stacie AcresMayer ordered Diclofenac 75mg  #60 one tablet bid. Orders escribed to [facility].05/20/2017-I spoke with Dr. Charlsie Merlesegal and he stated okay to use Lodine 500mg  #60 one tablet bid. I informed pt of Dr. Beverlee Nimsegal's orders. Pt states her pharmacist at work states the Lodine has a low probability of causing allergic reaction to Sulfa, but does contain sulfa. I told pt I would discuss with Dr. Charlsie Merlesegal and he would probably want to start her on Celebrex for the inflammation, but it would probably need Prior Authorization, or if she could take Ibuprofen 800mg  tid she could try that. Pt states she has been taking Ibuprofen 800mg  three times a day. Dr. Charlsie Merlesegal ordered pt to continue with the ibuprofen 800mg  tid for inflammation and pain, that although Celebrex has a very low probability of causing an allergic reaction due to sulfa component, if pt is severely sensitive he would not prescribe, recommended ice therapy with the ibuprofen. I left message informing pt of Dr. Charlsie Merlesegal orders.

## 2017-05-19 NOTE — Addendum Note (Signed)
Addended by: Alphia Kava'CONNELL, VALERY D on: 05/19/2017 11:56 AM   Modules accepted: Orders

## 2017-05-20 MED ORDER — ETODOLAC 500 MG PO TABS: 500.0000 mg | ORAL_TABLET | Freq: Two times a day (BID) | ORAL | 0 refills | Status: DC

## 2017-05-20 NOTE — Telephone Encounter (Signed)
I have a dilemma. The second medication called in for me yesterday also has sulfa in it which I'm allergic to. Can you please get this taken care of this morning as I spent 5 hours yesterday dealing with this and also because I'm leaving to go out of town this afternoon.

## 2017-05-20 NOTE — Addendum Note (Signed)
Addended by: Alphia Kava'CONNELL, VALERY D on: 05/20/2017 09:11 AM   Modules accepted: Orders

## 2017-05-20 NOTE — Addendum Note (Signed)
Addended by: Alphia Kava'CONNELL, VALERY D on: 05/20/2017 10:46 AM   Modules accepted: Orders

## 2017-06-09 ENCOUNTER — Ambulatory Visit (INDEPENDENT_AMBULATORY_CARE_PROVIDER_SITE_OTHER): Payer: 59 | Admitting: Podiatry

## 2017-06-09 ENCOUNTER — Encounter: Payer: Self-pay | Admitting: Podiatry

## 2017-06-09 DIAGNOSIS — M205X9 Other deformities of toe(s) (acquired), unspecified foot: Secondary | ICD-10-CM | POA: Diagnosis not present

## 2017-06-09 DIAGNOSIS — M722 Plantar fascial fibromatosis: Secondary | ICD-10-CM | POA: Diagnosis not present

## 2017-06-09 MED ORDER — METHYLPREDNISOLONE 4 MG PO TBPK: ORAL_TABLET | ORAL | 0 refills | Status: DC

## 2017-06-09 NOTE — Progress Notes (Signed)
   Subjective:    Patient ID: Kelly Walton, female    DOB: 09-15-1970, 46 y.o.   MRN: [id number]  Foot Pain  Associated symptoms include abdominal pain, chest pain, fatigue, numbness, a rash, vomiting and weakness.   This patient returns to the office follow-up for diagnosis of plantar fasciitis on both feet secondary to a functional hallux limitus both feet.  She says that she has not been able to take any other medication since they have all had sulfa in the medication.  She says she has significant sulfa allergy.  Therefore, she has not taken any meds aside from ibuprofen for the last 3 weeks.  She says she has worn the power step insoles and her pain has mildly improved from a 10 to a 7.  She returns to the office to discuss further treatment for her painful plantar fasciitis.  Review of Systems  Constitutional: Positive for activity change, appetite change and fatigue.  Eyes: Positive for pain and itching.  Cardiovascular: Positive for chest pain and leg swelling.       Calf pain with walking  Gastrointestinal: Positive for abdominal pain, blood in stool, diarrhea and vomiting.  Endocrine: Positive for polydipsia and polyuria.  Musculoskeletal: Positive for gait problem.       Joint pain  Skin: Positive for rash.  Neurological: Positive for weakness and numbness.  Hematological: Bruises/bleeds easily.       Objective:   Physical Exam GENERAL APPEARANCE: Alert, conversant. Appropriately groomed. No acute distress.  VASCULAR: Pedal pulses are  palpable at  [facility] and PT bilateral.  Capillary refill time is immediate to all digits,  Normal temperature gradient.  Digital hair growth is present bilateral  NEUROLOGIC: sensation is normal to 5.07 monofilament at 5/5 sites bilateral.  Light touch is intact bilateral, Muscle strength normal.  MUSCULOSKELETAL: acceptable muscle strength, tone and stability bilateral.  Intrinsic muscluature intact bilateral.  HAV  B/L with dorsal spurring first  metatarsal head  B/L. Limited ROM 1st MPJ  B/L upon loading her foot.  Midfoot  DJD 1st MCJ  Left foot.  Discomfort plantar fascia  B/L.    DERMATOLOGIC: skin color, texture, and turgor are within normal limits.  No preulcerative lesions or ulcers  are seen, no interdigital maceration noted.  No open lesions present.  Digital nails are asymptomatic. No drainage noted.        Assessment & Plan:  Functional hallux limitus  B/L  Plantar fascitis  B/L   ROV.  Evaluation of the patient reveals persistent pain noted through the arch of both feet with her left greater than her right.  He was told to continue to wear the power step insoles in her shoes  . Examination of her shoes reveal a thicker rigid sole  discussed her medication and we decided to call in a Medrol Dosepak for this patient instead of an anti-inflammatory which can aggravate her allergies.  This patient also is a candidate and I recommended we receive orthotics with a kinetic wedge for her to wear in her shoes.  It will proceeded to measure her foot for orthotic therapy.  Paperwork was given 2 on for her to check on orthotic coverage.  I attempted to perform these tasks because Raiford Noble  will be out of the office next week.  Told this patient. I can give her a second dosage of the Medrol Dosepak. If this is beneficial    RTC 3 weeks.Helane Gunther DPM

## 2017-06-11 ENCOUNTER — Telehealth: Payer: Self-pay | Admitting: Podiatry

## 2017-06-11 NOTE — Telephone Encounter (Signed)
Left voicemail for pt to call me back to discuss insurance coverage for the orthotics.

## 2017-06-21 ENCOUNTER — Encounter: Payer: Self-pay | Admitting: Primary Care

## 2017-06-21 ENCOUNTER — Ambulatory Visit (INDEPENDENT_AMBULATORY_CARE_PROVIDER_SITE_OTHER): Payer: 59 | Admitting: Primary Care

## 2017-06-21 ENCOUNTER — Ambulatory Visit
Admission: RE | Admit: 2017-06-21 | Discharge: 2017-06-21 | Disposition: A | Discharge status: Home / Self Care | Payer: 59 | Source: Ambulatory Visit | Attending: Primary Care | Admitting: Primary Care

## 2017-06-21 VITALS — BP 126/80 | HR 101 | Temp 98.0°F | Ht 63.75 in | Wt 112.4 lb

## 2017-06-21 DIAGNOSIS — I10 Essential (primary) hypertension: Secondary | ICD-10-CM | POA: Diagnosis not present

## 2017-06-21 DIAGNOSIS — Z1231 Encounter for screening mammogram for malignant neoplasm of breast: Secondary | ICD-10-CM | POA: Diagnosis not present

## 2017-06-21 DIAGNOSIS — Z0001 Encounter for general adult medical examination with abnormal findings: Secondary | ICD-10-CM | POA: Diagnosis not present

## 2017-06-21 DIAGNOSIS — Z1239 Encounter for other screening for malignant neoplasm of breast: Secondary | ICD-10-CM

## 2017-06-21 DIAGNOSIS — R1010 Upper abdominal pain, unspecified: Secondary | ICD-10-CM | POA: Diagnosis not present

## 2017-06-21 DIAGNOSIS — Z0000 Encounter for general adult medical examination without abnormal findings: Secondary | ICD-10-CM | POA: Diagnosis not present

## 2017-06-21 DIAGNOSIS — G4700 Insomnia, unspecified: Secondary | ICD-10-CM | POA: Diagnosis not present

## 2017-06-21 HISTORY — DX: Upper abdominal pain, unspecified: R10.10

## 2017-06-21 LAB — LIPID PANEL
CHOLESTEROL: 261 mg/dL — AB (ref 0–200)
HDL: 120.4 mg/dL (ref >39.00)
LDL Cholesterol: 116 mg/dL — ABNORMAL HIGH (ref 0–99)
NonHDL: 140.48
TRIGLYCERIDES: 124 mg/dL (ref 0.0–149.0)
Total CHOL/HDL Ratio: 2
VLDL: 24.8 mg/dL (ref 0.0–40.0)

## 2017-06-21 LAB — COMPREHENSIVE METABOLIC PANEL
ALK PHOS: 72 U/L (ref 39–117)
ALT: 28 U/L (ref 0–35)
AST: 35 U/L (ref 0–37)
Albumin: 4.5 g/dL (ref 3.5–5.2)
BILIRUBIN TOTAL: 0.8 mg/dL (ref 0.2–1.2)
BUN: 10 mg/dL (ref 6–23)
CO2: 26 mEq/L (ref 19–32)
CREATININE: 0.91 mg/dL (ref 0.40–1.20)
Calcium: 9.8 mg/dL (ref 8.4–10.5)
Chloride: 100 mEq/L (ref 96–112)
GFR: 70.96 mL/min (ref >60.00)
GLUCOSE: 105 mg/dL — AB (ref 70–99)
Potassium: 4.6 mEq/L (ref 3.5–5.1)
Sodium: 136 mEq/L (ref 135–145)
TOTAL PROTEIN: 7.6 g/dL (ref 6.0–8.3)

## 2017-06-21 LAB — CBC
HCT: 39.9 % (ref 36.0–46.0)
Hemoglobin: 13.5 g/dL (ref 12.0–15.0)
MCHC: 33.8 g/dL (ref 30.0–36.0)
MCV: 91.6 fl (ref 78.0–100.0)
PLATELETS: 246 10*3/uL (ref 150.0–400.0)
RBC: 4.36 Mil/uL (ref 3.87–5.11)
RDW: 13.6 % (ref 11.5–15.5)
WBC: 7.3 10*3/uL (ref 4.0–10.5)

## 2017-06-21 NOTE — Progress Notes (Signed)
Subjective:    Patient ID: Kelly Walton, female    DOB: Aug 02, 1971, 46 y.o.   MRN: [id number]  HPI  Kelly Walton is a 46 year old female who presents today for complete physical.  Immunizations: -Tetanus: Completed in 2016 -Influenza: Completed at work this season.   Diet: She endorses a fair diet.  Breakfast: boiled egg, toast, oatmeal, granola bar Lunch: Lunchable, sandwich Dinner: Salad with protein Snacks: Fruit Desserts: Once weekly (ice cream) Beverages: Water, Coke  Exercise: She is not currently exercising.  Eye exam: Completed in 2018. Dental exam:  Completes semi-annually Pap Smear: Hysterectomy  Mammogram: Due.    Review of Systems  Constitutional: Negative for unexpected weight change.  HENT: Negative for rhinorrhea.   Respiratory: Negative for cough and shortness of breath.   Cardiovascular: Negative for chest pain.  Gastrointestinal: Positive for diarrhea. Negative for blood in stool, constipation, nausea and vomiting.       Intermittent bilateral upper quadrant pain, diarrhea 2-3 times weekly (one episode each time). No pain with food or water. Pain lasts seconds to minutes.   Genitourinary: Negative for difficulty urinating and menstrual problem.  Musculoskeletal: Negative for arthralgias and myalgias.  Skin: Negative for rash.  Allergic/Immunologic: Negative for environmental allergies.  Neurological: Negative for dizziness, numbness and headaches.  Psychiatric/Behavioral:       Denies concerns for anxiety and depression       Past Medical History:  Diagnosis Date  . Alcohol abuse   . Asthma   . Hypertension   . Insomnia      Social History   Social History  . Marital status: Single    Spouse name: N/A  . Number of children: N/A  . Years of education: N/A   Occupational History  . Not on file.   Social History Main Topics  . Smoking status: Never Smoker  . Smokeless tobacco: Never Used  . Alcohol use Yes     Comment: occasion  .  Drug use: No  . Sexual activity: Not on file   Other Topics Concern  . Not on file   Social History Narrative   Single.   2 children, 1 step-son.   Manager at [employer].   Enjoys going to [employer], spending time with family.     Past Surgical History:  Procedure Laterality Date  . CESAREAN SECTION    . FRACTURE SURGERY    . ROBOTIC ASSISTED TOTAL HYSTERECTOMY N/A 03/05/2014   Procedure: ROBOTIC ASSISTED TOTAL HYSTERECTOMY/BILATERAL SALPINGECTOMY;  Surgeon: Serita Kyle, MD;  Location: [facility];  Service: Gynecology;  Laterality: N/A;  . TUBAL LIGATION      Family History  Problem Relation Age of Onset  . Arthritis Mother   . Arthritis Father   . Lung cancer Father   . Hyperlipidemia Maternal Grandmother   . Stroke Maternal Grandmother   . Hypertension Maternal Grandmother   . Alcohol abuse Paternal Grandmother   . Arthritis Paternal Grandmother   . Mental illness Paternal Grandmother     Allergies  Allergen Reactions  . Promethazine Hcl Hives and Rash  . Sulfa Antibiotics Hives and Rash    Pt states sent her to the hospital.    Current Outpatient Prescriptions on File Prior to Visit  Medication Sig Dispense Refill  . albuterol (PROVENTIL HFA;VENTOLIN HFA) 108 (90 BASE) MCG/ACT inhaler Inhale 2 puffs into the lungs daily as needed for wheezing or shortness of breath. For shortness of breath    . amLODipine (NORVASC) 10 MG  tablet Take 10 mg by mouth every evening.    Marland Kitchen amLODipine (NORVASC) 10 MG tablet TAKE ONE TABLET (10 MG TOTAL) BY MOUTH DAILY.    . montelukast (SINGULAIR) 10 MG tablet Take 10 mg by mouth at bedtime.    Marland Kitchen zolpidem (AMBIEN) 5 MG tablet Take 1 tablet (5 mg total) by mouth at bedtime as needed for sleep. 90 tablet 0   No current facility-administered medications on file prior to visit.     BP 126/80   Pulse (!) 101   Temp 98 F (36.7 C) (Oral)   Ht 5' 3.75" (1.619 m)   Wt 112 lb 6.4 oz (51 kg)   LMP 03/05/2014   SpO2 98%   BMI 19.45 kg/m     Objective:   Physical Exam  Constitutional: She is oriented to person, place, and time. She appears well-nourished.  HENT:  Right Ear: Tympanic membrane and ear canal normal.  Left Ear: Tympanic membrane and ear canal normal.  Nose: Nose normal.  Mouth/Throat: Oropharynx is clear and moist.  Eyes: Pupils are equal, round, and reactive to light. Conjunctivae and EOM are normal.  Neck: Neck supple. No thyromegaly present.  Cardiovascular: Normal rate and regular rhythm.   No murmur heard. Pulmonary/Chest: Effort normal and breath sounds normal. She has no rales.  Abdominal: Soft. Bowel sounds are normal. There is tenderness in the right upper quadrant. There is no guarding, no CVA tenderness, no tenderness at McBurney's point and negative Murphy's sign.  Musculoskeletal: Normal range of motion.  Lymphadenopathy:    She has no cervical adenopathy.  Neurological: She is alert and oriented to person, place, and time. She has normal reflexes. No cranial nerve deficit.  Skin: Skin is warm and dry. No rash noted.  Small flat patch of light erythema to right anterior lower extremity proximal to ankle.   Psychiatric: She has a normal mood and affect.          Assessment & Plan:

## 2017-06-21 NOTE — Assessment & Plan Note (Signed)
Stable in the office today, continue Amlodipine 10 mg. 

## 2017-06-21 NOTE — Patient Instructions (Signed)
Complete lab work prior to leaving today. I will notify you of your results once received.   Call the [facility] to schedule your mammogram.   Start exercising. You should be getting 150 minutes of moderate intensity exercise weekly.  Continue to follow a healthy diet. Limit soda.  Ensure you are consuming 64 ounces of water daily.  Follow up in 1 year for your annual exam or sooner if needed.  It was a pleasure to see you today!

## 2017-06-21 NOTE — Assessment & Plan Note (Signed)
Present for the past 1 year. History of alcohol abuse, no relapse. Exam today with mild RUQ tenderness, no Murphy's sign. Check CBC, CMP today. Consider abdominal ultrasound once labs return.

## 2017-06-21 NOTE — Assessment & Plan Note (Signed)
Immunizations UTD. Mammogram due, pending. Recommended regular exercise. Commended improvements in diet. Exam with RUQ tenderness, will check labs. Consider ultrasound.  Labs pending. Follow up in 1 year.

## 2017-06-21 NOTE — Assessment & Plan Note (Signed)
Doing well on Ambien. Continue same.

## 2017-06-22 ENCOUNTER — Encounter: Payer: Self-pay | Admitting: Primary Care

## 2017-06-22 DIAGNOSIS — R1010 Upper abdominal pain, unspecified: Secondary | ICD-10-CM

## 2017-06-30 ENCOUNTER — Ambulatory Visit: Payer: 59 | Admitting: Podiatry

## 2017-07-02 ENCOUNTER — Other Ambulatory Visit: Payer: Self-pay | Admitting: Podiatry

## 2017-07-02 NOTE — Telephone Encounter (Signed)
Called pt again and gave her information on benefits for orthotics and pt said she wants to hold off for now.

## 2017-07-09 ENCOUNTER — Encounter: Payer: Self-pay | Admitting: Primary Care

## 2017-07-09 ENCOUNTER — Ambulatory Visit
Admission: RE | Admit: 2017-07-09 | Discharge: 2017-07-09 | Disposition: A | Discharge status: Home / Self Care | Payer: 59 | Source: Ambulatory Visit | Attending: Primary Care | Admitting: Primary Care

## 2017-07-09 DIAGNOSIS — R1010 Upper abdominal pain, unspecified: Secondary | ICD-10-CM

## 2017-07-09 DIAGNOSIS — R109 Unspecified abdominal pain: Secondary | ICD-10-CM | POA: Diagnosis not present

## 2017-07-12 ENCOUNTER — Other Ambulatory Visit: Payer: Self-pay | Admitting: Primary Care

## 2017-07-12 ENCOUNTER — Encounter: Payer: Self-pay | Admitting: Primary Care

## 2017-07-12 DIAGNOSIS — G4700 Insomnia, unspecified: Secondary | ICD-10-CM

## 2017-07-12 NOTE — Telephone Encounter (Signed)
Ok to refill? Electronically refill request for zolpidem (AMBIEN) 5 MG tablet  Last prescribed on 04/16/2017. Last seen on 06/21/2017

## 2017-07-13 ENCOUNTER — Telehealth: Payer: Self-pay | Admitting: Primary Care

## 2017-07-13 NOTE — Telephone Encounter (Signed)
Ok to phone in Ambien 

## 2017-07-13 NOTE — Telephone Encounter (Signed)
This was also sent as refill note on 07/12/2017

## 2017-07-13 NOTE — Telephone Encounter (Signed)
Pt. Requesting Ambien refill.

## 2017-07-13 NOTE — Telephone Encounter (Signed)
Copied from CRM #[id number]. Topic: Quick Communication - See Telephone Encounter >> Jul 13, 2017  8:31 AM Arlyss Gandyichardson, Caston Coopersmith N, NT wrote: CRM for notification. See Telephone encounter for: Patient calling to get a refill for her ambien. Please give pt a call once this has been refilled.  07/13/17.

## 2017-07-13 NOTE — Telephone Encounter (Unsigned)
Copied from CRM #[id number]. Topic: Quick Communication - See Telephone Encounter >> Jul 13, 2017  4:46 PM Stephannie LiSimmons, Maguire Killmer L, NT wrote: CRM for notification. See Telephone encounter for: Patient says prescription called in to the wrong pharmacy, [facility] [address]  pharmacy will transfer there because this A new prescription  07/13/17.

## 2017-07-13 NOTE — Telephone Encounter (Signed)
Called in medication to the pharmacy as instructed. 

## 2017-07-30 DIAGNOSIS — R079 Chest pain, unspecified: Secondary | ICD-10-CM | POA: Diagnosis not present

## 2017-07-30 DIAGNOSIS — M79674 Pain in right toe(s): Secondary | ICD-10-CM | POA: Diagnosis not present

## 2017-07-30 DIAGNOSIS — I10 Essential (primary) hypertension: Secondary | ICD-10-CM | POA: Diagnosis not present

## 2017-08-20 ENCOUNTER — Ambulatory Visit: Payer: 59 | Admitting: Family Medicine

## 2017-08-20 ENCOUNTER — Encounter: Payer: Self-pay | Admitting: Family Medicine

## 2017-08-20 ENCOUNTER — Ambulatory Visit (INDEPENDENT_AMBULATORY_CARE_PROVIDER_SITE_OTHER): Payer: 59 | Admitting: Family Medicine

## 2017-08-20 VITALS — BP 120/82 | HR 105 | Temp 98.0°F | Wt 112.0 lb

## 2017-08-20 DIAGNOSIS — J22 Unspecified acute lower respiratory infection: Secondary | ICD-10-CM

## 2017-08-20 DIAGNOSIS — J4520 Mild intermittent asthma, uncomplicated: Secondary | ICD-10-CM | POA: Diagnosis not present

## 2017-08-20 DIAGNOSIS — J45909 Unspecified asthma, uncomplicated: Secondary | ICD-10-CM | POA: Insufficient documentation

## 2017-08-20 MED ORDER — GUAIFENESIN-CODEINE 100-10 MG/5ML PO SYRP: 5.0000 mL | ORAL_SOLUTION | Freq: Every evening | ORAL | 0 refills | Status: DC | PRN

## 2017-08-20 NOTE — Patient Instructions (Addendum)
You have an acute respiratory infection. This is likely viral. Antibiotics are not needed for this. Viral infections usually take 7-10 days to resolve.  The cough can last a few weeks to go away. Use medication as prescribed: cheratussin for night time Push fluids and plenty of rest.  Use albuterol inhaler more regularly around illness.  Start taking ibuprofen 400-600mg  with meals for next few days.  Let us know if you are not improving as expected, or if you have high fevers (>101.5) or difficulty swallowing or worsening productive cough. Call clinic with questions.  Good to see you today. I hope you start feeling better soon.

## 2017-08-20 NOTE — Assessment & Plan Note (Addendum)
Anticipate viral given short duration.  Treat with codeine cough suppressant at night (pt has tolerated well in the past) and supportive care as per instructions. Red flags to update us or seek further care reviewed.  In asthma history, encouraged scheduled albuterol over next 3 days.  No need for prednisone or antibiotic today as no signs of bacterial infection or asthma exacerbation.

## 2017-08-20 NOTE — Progress Notes (Signed)
BP 120/82 (BP Location: Right Arm, Patient Position: Sitting, Cuff Size: Normal)   Pulse (!) 105   Temp 98 F (36.7 C) (Oral)   Wt 112 lb (50.8 kg)   LMP 03/05/2014   SpO2 98%   BMI 19.38 kg/m    CC: cough Subjective:    Patient ID: Maurie Boettcherammy A Tworek, female    DOB: 1971-07-18, 46 y.o.   MRN: [id number]  HPI: Ria Clockammy A [name] is a 46 y.o. female presenting on 08/20/2017 for Cough (productive. Chest hurts with cough, worse at night. Started 08/17/17. Tried Mucinex and lozenges, barely helpful)   3d h/o sore throat with PNdrainage, productive cough worse at night time - trouble sleeping due to cough with fits, rhinorrhea. Some chills. Mild headache. Mild wheezing endorses h/o asthma - she has needed albuterol inhaler. Chest congestion. Called out of work yesterday and today.   No fevers, ear or tooth pain, dyspnea.   Treating with airborne and mucinex.  Works at [employer] - lots of sick contacts there  Non smoker.  Flu shot received.   Relevant past medical, surgical, family and social history reviewed and updated as indicated. Interim medical history since our last visit reviewed. Allergies and medications reviewed and updated. Outpatient Medications Prior to Visit  Medication Sig Dispense Refill  . albuterol (PROVENTIL HFA;VENTOLIN HFA) 108 (90 BASE) MCG/ACT inhaler Inhale 2 puffs into the lungs daily as needed for wheezing or shortness of breath. For shortness of breath    . amLODipine (NORVASC) 10 MG tablet TAKE ONE TABLET (10 MG TOTAL) BY MOUTH DAILY.    . montelukast (SINGULAIR) 10 MG tablet Take 10 mg by mouth at bedtime.    Marland Kitchen. zolpidem (AMBIEN) 5 MG tablet TAKE 1 TABLET BY MOUTH AT BEDTIME AS NEEDED FOR SLEEP 90 tablet 0   No facility-administered medications prior to visit.      Per HPI unless specifically indicated in ROS section below Review of Systems     Objective:    BP 120/82 (BP Location: Right Arm, Patient Position: Sitting, Cuff Size: Normal)   Pulse (!) 105    Temp 98 F (36.7 C) (Oral)   Wt 112 lb (50.8 kg)   LMP 03/05/2014   SpO2 98%   BMI 19.38 kg/m   Wt Readings from Last 3 Encounters:  08/20/17 112 lb (50.8 kg)  06/21/17 112 lb 6.4 oz (51 kg)  04/16/17 109 lb (49.4 kg)    Physical Exam  Constitutional: She appears well-developed and well-nourished. No distress.  HENT:  Head: Normocephalic and atraumatic.  Right Ear: Hearing, tympanic membrane, external ear and ear canal normal.  Left Ear: Hearing, tympanic membrane, external ear and ear canal normal.  Nose: Mucosal edema (congested nasal mucosa) present. No rhinorrhea. Right sinus exhibits no maxillary sinus tenderness and no frontal sinus tenderness. Left sinus exhibits no maxillary sinus tenderness and no frontal sinus tenderness.  Mouth/Throat: Uvula is midline and mucous membranes are normal. Posterior oropharyngeal edema and posterior oropharyngeal erythema present. No oropharyngeal exudate or tonsillar abscesses.  Eyes: Conjunctivae and EOM are normal. Pupils are equal, round, and reactive to light. No scleral icterus.  Neck: Normal range of motion. Neck supple.  Cardiovascular: Normal rate, regular rhythm, normal heart sounds and intact distal pulses.  No murmur heard. Pulmonary/Chest: Effort normal and breath sounds normal. No respiratory distress. She has no wheezes. She has no rales.  Lungs clear, no wheezing  Lymphadenopathy:    She has no cervical adenopathy.  Skin: Skin is warm  and dry. No rash noted.  Nursing note and vitals reviewed.      Assessment & Plan:   Problem List Items Addressed This Visit    Acute respiratory infection - Primary    Anticipate viral given short duration.  Treat with codeine cough suppressant at night (pt has tolerated well in the past) and supportive care as per instructions. Red flags to update us or seek further care reviewed.  In asthma history, encouraged scheduled albuterol over next 3 days.  No need for prednisone or antibiotic  today as no signs of bacterial infection or asthma exacerbation.       Asthma       Follow up plan: No Follow-up on file.  Eustaquio BoydenJavier Ekta Dancer, MD

## 2017-08-21 ENCOUNTER — Encounter: Payer: Self-pay | Admitting: Family Medicine

## 2017-08-23 ENCOUNTER — Telehealth: Payer: Self-pay | Admitting: Primary Care

## 2017-08-23 MED ORDER — AZITHROMYCIN 250 MG PO TABS: ORAL_TABLET | ORAL | 0 refills | Status: DC

## 2017-08-23 NOTE — Telephone Encounter (Signed)
zpack sent in. See phone note.

## 2017-08-23 NOTE — Telephone Encounter (Signed)
[facility]z [facility] [address]. Return for recheck if not better with antibiotic.  Thanks.

## 2017-08-23 NOTE — Telephone Encounter (Signed)
Pt is requesting abx because she is getting worse. Has family coming in on Thursday. Still has low grade fever, not over 100.  [id number]- [place], [place] - [phone] (Phone) [phone] (Fax)

## 2017-08-23 NOTE — Telephone Encounter (Signed)
Copied from CRM [id number]. Topic: Quick Communication - See Telephone Encounter >> Aug 23, 2017  8:34 AM Herby AbrahamJohnson, Shiquita C wrote: CRM for notification. See Telephone encounter for: Pt called in, pt says that she was seen by provider and is not feeling any better. Pt says that she received a cough med with codeine and has not completed it yet.  Pt would like to be advised further.  Pt would like a call back today if possible.    CB: [phone]: [phone]   08/23/17.

## 2017-08-23 NOTE — Telephone Encounter (Signed)
Spoke with pt relaying message per Dr. G.  Pt says ok. 

## 2017-08-23 NOTE — Telephone Encounter (Signed)
Per pt email today; pt feels worse, when swallows feels like swallowing glass;T 100.2.Please advise.pt seen 08/20/17.

## 2017-08-27 ENCOUNTER — Encounter: Payer: Self-pay | Admitting: Family Medicine

## 2017-08-29 MED ORDER — AZITHROMYCIN 250 MG PO TABS: ORAL_TABLET | ORAL | 0 refills | Status: DC

## 2017-09-14 ENCOUNTER — Other Ambulatory Visit: Payer: Self-pay | Admitting: Family Medicine

## 2017-09-14 NOTE — Telephone Encounter (Signed)
Last filled:  08/20/17, #140 ml Last OV:  08/20/17 Next OV:  None  Jae DireKate pt.

## 2017-09-14 NOTE — Telephone Encounter (Signed)
Sent electronically 

## 2017-09-29 ENCOUNTER — Encounter: Payer: Self-pay | Admitting: Primary Care

## 2017-10-01 ENCOUNTER — Encounter: Payer: Self-pay | Admitting: Primary Care

## 2017-10-01 DIAGNOSIS — I10 Essential (primary) hypertension: Secondary | ICD-10-CM

## 2017-10-01 DIAGNOSIS — G4700 Insomnia, unspecified: Secondary | ICD-10-CM

## 2017-10-01 MED ORDER — ZOLPIDEM TARTRATE 5 MG PO TABS: 5.0000 mg | ORAL_TABLET | Freq: Every evening | ORAL | 0 refills | Status: DC | PRN

## 2017-10-01 MED ORDER — AMLODIPINE BESYLATE 10 MG PO TABS: ORAL_TABLET | ORAL | 2 refills | Status: DC

## 2017-10-01 NOTE — Telephone Encounter (Signed)
Please call in Ambien as noted on chart, #90, no refills.

## 2017-10-04 NOTE — Telephone Encounter (Signed)
Called in medication to the pharmacy as instructed. 

## 2017-10-07 ENCOUNTER — Other Ambulatory Visit: Payer: Self-pay | Admitting: Primary Care

## 2017-10-07 DIAGNOSIS — G4700 Insomnia, unspecified: Secondary | ICD-10-CM

## 2017-10-28 ENCOUNTER — Ambulatory Visit (INDEPENDENT_AMBULATORY_CARE_PROVIDER_SITE_OTHER)
Admission: RE | Admit: 2017-10-28 | Discharge: 2017-10-28 | Disposition: A | Discharge status: Home / Self Care | Payer: 59 | Source: Ambulatory Visit | Attending: Internal Medicine | Admitting: Internal Medicine

## 2017-10-28 ENCOUNTER — Ambulatory Visit (INDEPENDENT_AMBULATORY_CARE_PROVIDER_SITE_OTHER): Payer: 59 | Admitting: Internal Medicine

## 2017-10-28 ENCOUNTER — Encounter: Payer: Self-pay | Admitting: Internal Medicine

## 2017-10-28 ENCOUNTER — Other Ambulatory Visit: Payer: Self-pay | Admitting: Internal Medicine

## 2017-10-28 VITALS — BP 130/82 | HR 81 | Temp 98.2°F | Wt 115.0 lb

## 2017-10-28 DIAGNOSIS — R0789 Other chest pain: Secondary | ICD-10-CM

## 2017-10-28 DIAGNOSIS — R911 Solitary pulmonary nodule: Secondary | ICD-10-CM | POA: Diagnosis not present

## 2017-10-28 DIAGNOSIS — S299XXA Unspecified injury of thorax, initial encounter: Secondary | ICD-10-CM | POA: Diagnosis not present

## 2017-10-28 DIAGNOSIS — Y92009 Unspecified place in unspecified non-institutional (private) residence as the place of occurrence of the external cause: Secondary | ICD-10-CM

## 2017-10-28 DIAGNOSIS — W19XXXA Unspecified fall, initial encounter: Secondary | ICD-10-CM

## 2017-10-28 DIAGNOSIS — R0781 Pleurodynia: Secondary | ICD-10-CM | POA: Diagnosis not present

## 2017-10-28 MED ORDER — IOPAMIDOL (ISOVUE-300) INJECTION 61%
80.0000 mL | Freq: Once | INTRAVENOUS | Status: AC | PRN
  Administered 2017-10-28: 80 mL via INTRAVENOUS

## 2017-10-28 MED ORDER — TRAMADOL HCL 50 MG PO TABS: 50.0000 mg | ORAL_TABLET | Freq: Three times a day (TID) | ORAL | 0 refills | Status: DC | PRN

## 2017-10-28 NOTE — Patient Instructions (Signed)

## 2017-10-28 NOTE — Progress Notes (Signed)
Subjective:    Patient ID: Kelly Walton, female    DOB: 07/12/1971, 47 y.o.   MRN: [id number]  HPI  Pt presents to the clinic today with c/o sternal and left upper chest wall pain. She reports this started 5 days ago, after she tripped over her dog's bed. She landed on her knees and then caught herself with her left arm. She reports her knees are fine. She has sternal and chest wall pain. She describes the pain as sharp and stabbing. The pain does not radiate, but seems worse with taking a deep breath and moving her left arm. She has tried Ibuprofen, heat and ice without any relief.  Review of Systems  Past Medical History:  Diagnosis Date  . Alcohol abuse   . Asthma   . Hypertension   . Insomnia     Current Outpatient Medications  Medication Sig Dispense Refill  . albuterol (PROVENTIL HFA;VENTOLIN HFA) 108 (90 BASE) MCG/ACT inhaler Inhale 2 puffs into the lungs daily as needed for wheezing or shortness of breath. For shortness of breath    . amLODipine (NORVASC) 10 MG tablet TAKE ONE TABLET (10 MG TOTAL) BY MOUTH DAILY. 90 tablet 2  . cetirizine (ZYRTEC) 10 MG tablet Take 10 mg by mouth daily.    . montelukast (SINGULAIR) 10 MG tablet Take 10 mg by mouth at bedtime.    Marland Kitchen zolpidem (AMBIEN) 5 MG tablet Take 1 tablet (5 mg total) by mouth at bedtime as needed. for sleep 90 tablet 0  . traMADol (ULTRAM) 50 MG tablet Take 1 tablet (50 mg total) by mouth every 8 (eight) hours as needed. 30 tablet 0   No current facility-administered medications for this visit.     Allergies  Allergen Reactions  . Promethazine Hcl Hives and Rash  . Sulfa Antibiotics Hives and Rash    Pt states sent her to the hospital.    Family History  Problem Relation Age of Onset  . Arthritis Mother   . Arthritis Father   . Lung cancer Father   . Hyperlipidemia Maternal Grandmother   . Stroke Maternal Grandmother   . Hypertension Maternal Grandmother   . Alcohol abuse Paternal Grandmother   . Arthritis  Paternal Grandmother   . Mental illness Paternal Grandmother   . Breast cancer Maternal Aunt     Social History   Socioeconomic History  . Marital status: Single    Spouse name: Not on file  . Number of children: Not on file  . Years of education: Not on file  . Highest education level: Not on file  Social Needs  . Financial resource strain: Not on file  . Food insecurity - worry: Not on file  . Food insecurity - inability: Not on file  . Transportation needs - medical: Not on file  . Transportation needs - non-medical: Not on file  Occupational History  . Not on file  Tobacco Use  . Smoking status: Never Smoker  . Smokeless tobacco: Never Used  Substance and Sexual Activity  . Alcohol use: Yes    Comment: occasion  . Drug use: No  . Sexual activity: Not on file  Other Topics Concern  . Not on file  Social History Narrative   Single.   2 children, 1 step-son.   Manager at [employer].   Enjoys going to [employer], spending time with family.      Constitutional: Denies fever, malaise, fatigue, headache or abrupt weight changes.  Respiratory: Denies difficulty breathing, shortness  of breath, cough or sputum production.   Cardiovascular: Denies chest pain, chest tightness, palpitations or swelling in the hands or feet.  Musculoskeletal: Pt reports sternal pain and left chest wall pain. Denies difficulty with gait, or joint pain and swelling.  Skin: Denies redness, rashes, lesions or ulcercations.   No other specific complaints in a complete review of systems (except as listed in HPI above).     Objective:   Physical Exam   BP 130/82   Pulse 81   Temp 98.2 F (36.8 C) (Oral)   Wt 115 lb (52.2 kg)   LMP 03/05/2014   SpO2 98%   BMI 19.89 kg/m  Wt Readings from Last 3 Encounters:  10/28/17 115 lb (52.2 kg)  08/20/17 112 lb (50.8 kg)  06/21/17 112 lb 6.4 oz (51 kg)    General: Appears  her stated age, in NAD. Skin: Warm, dry and intact. No bruising noted. Swelling noted  over the left chest wall. Musculoskeletal: Mild pain with palpation over the mid to lower sternum. Pain with palpation over the left anterior ribs.    BMET    Component Value Date/Time   NA 136 06/21/2017 1107   K 4.6 06/21/2017 1107   CL 100 06/21/2017 1107   CO2 26 06/21/2017 1107   GLUCOSE 105 (H) 06/21/2017 1107   BUN 10 06/21/2017 1107   CREATININE 0.91 06/21/2017 1107   CALCIUM 9.8 06/21/2017 1107   GFRNONAA >60 02/26/2017 2305   GFRAA >60 02/26/2017 2305    Lipid Panel     Component Value Date/Time   CHOL 261 (H) 06/21/2017 1107   TRIG 124.0 06/21/2017 1107   HDL 120.40 06/21/2017 1107   CHOLHDL 2 06/21/2017 1107   VLDL 24.8 06/21/2017 1107   LDLCALC 116 (H) 06/21/2017 1107    CBC    Component Value Date/Time   WBC 7.3 06/21/2017 1107   RBC 4.36 06/21/2017 1107   HGB 13.5 06/21/2017 1107   HCT 39.9 06/21/2017 1107   PLT 246.0 06/21/2017 1107   MCV 91.6 06/21/2017 1107   MCH 30.5 02/26/2017 2305   MCHC 33.8 06/21/2017 1107   RDW 13.6 06/21/2017 1107   LYMPHSABS 0.4 (L) 01/23/2015 1641   MONOABS 0.1 01/23/2015 1641   EOSABS 0.0 01/23/2015 1641   BASOSABS 0.0 01/23/2015 1641    Hgb A1C No results found for: HGBA1C         Assessment & Plan:   Sternal Pain, Left Chest Wall Pain s/p Fall At Home:  Chest xray today to r/o fracture Continue Ibuprofen and Tylenol for mild pain eRx for Tramadol 50 mg TID for severe pain No heavy lifting until xray results are back  Will folllow up after chest xray, return precautions discussed Nicki ReaperBAITY, REGINA, NP

## 2017-10-29 ENCOUNTER — Encounter: Payer: Self-pay | Admitting: Internal Medicine

## 2017-11-19 DIAGNOSIS — L239 Allergic contact dermatitis, unspecified cause: Secondary | ICD-10-CM | POA: Diagnosis not present

## 2017-12-10 ENCOUNTER — Encounter: Payer: Self-pay | Admitting: Primary Care

## 2018-01-01 ENCOUNTER — Other Ambulatory Visit: Payer: Self-pay | Admitting: Family Medicine

## 2018-01-06 ENCOUNTER — Other Ambulatory Visit: Payer: Self-pay | Admitting: Primary Care

## 2018-01-06 DIAGNOSIS — G4700 Insomnia, unspecified: Secondary | ICD-10-CM

## 2018-01-06 NOTE — Telephone Encounter (Signed)
Noted. UDS is up to date. Refill sent to pharmacy.

## 2018-01-06 NOTE — Telephone Encounter (Signed)
Ok to refill? Electronically refill request for zolpidem (AMBIEN) 5 MG tablet  Last prescribed on 10/01/2017. Last seen on 06/21/2017

## 2018-02-04 DIAGNOSIS — L308 Other specified dermatitis: Secondary | ICD-10-CM | POA: Diagnosis not present

## 2018-02-04 DIAGNOSIS — L309 Dermatitis, unspecified: Secondary | ICD-10-CM | POA: Diagnosis not present

## 2018-02-11 DIAGNOSIS — L404 Guttate psoriasis: Secondary | ICD-10-CM | POA: Diagnosis not present

## 2018-03-23 ENCOUNTER — Telehealth: Payer: Self-pay | Admitting: Primary Care

## 2018-03-23 ENCOUNTER — Encounter: Payer: Self-pay | Admitting: Primary Care

## 2018-03-23 DIAGNOSIS — G4700 Insomnia, unspecified: Secondary | ICD-10-CM

## 2018-03-23 NOTE — Telephone Encounter (Signed)
Ambien refill. Pt requesting early refill due to losing medication. See pt email from 03/23/18 Last Refill:01/06/18 #90 Last OV:  PCP: Eldridge DaceK. Clark,NP [facility] [address]

## 2018-03-23 NOTE — Telephone Encounter (Signed)
Copied from CRM [id number]. Topic: Quick Communication - Rx Refill/Question >> Mar 23, 2018  2:06 PM Oneal GroutSebastian, Jennifer S wrote: Medication: zolpidem (AMBIEN) 5 MG tablet, early refill, lost meds  Has the patient contacted their pharmacy? Yes.   (Agent: If no, request that the patient contact the pharmacy for the refill.) (Agent: If yes, when and what did the pharmacy advise?)  Preferred Pharmacy (with phone number [facility] street [facility] [address]  Agent: Please be advised that RX refills may take up to 3 business days. We ask that you follow-up with your pharmacy.

## 2018-03-24 DIAGNOSIS — L309 Dermatitis, unspecified: Secondary | ICD-10-CM | POA: Diagnosis not present

## 2018-03-24 DIAGNOSIS — L308 Other specified dermatitis: Secondary | ICD-10-CM | POA: Diagnosis not present

## 2018-03-24 NOTE — Telephone Encounter (Signed)
Noted. Cannot refill controlled substance, too soon.

## 2018-03-24 NOTE — Telephone Encounter (Signed)
Name of Medication: Ambien 5 mg [facility]  [address] Last [place] or Written Date and Quantity: # 90 on 01/06/18 Last Office Visit and Type: 06/21/17 annual and 10/28/17 acute Next Office Visit and Type: none scheduled Last Controlled Substance Agreement Date: 04/16/17 Last UDS:04/16/17   Per pt email 03/23/18 Allayne GitelmanK Clark NP notified pt by email cannot fill Ambien this soon. FYI to [place] for confirmation.

## 2018-03-29 DIAGNOSIS — L239 Allergic contact dermatitis, unspecified cause: Secondary | ICD-10-CM | POA: Diagnosis not present

## 2018-03-31 DIAGNOSIS — L239 Allergic contact dermatitis, unspecified cause: Secondary | ICD-10-CM | POA: Diagnosis not present

## 2018-04-04 ENCOUNTER — Other Ambulatory Visit: Payer: Self-pay | Admitting: Primary Care

## 2018-04-04 DIAGNOSIS — G4700 Insomnia, unspecified: Secondary | ICD-10-CM

## 2018-04-04 DIAGNOSIS — L239 Allergic contact dermatitis, unspecified cause: Secondary | ICD-10-CM | POA: Diagnosis not present

## 2018-04-05 NOTE — Telephone Encounter (Signed)
Due for CPE in October, will need to be seen then for further refills after October. Please schedule.

## 2018-04-05 NOTE — Telephone Encounter (Signed)
Name of Medication: zolpidem (AMBIEN) 5 MG tablet  [facility]  Last Fill or Written Date and Quantity: 01/06/2018 #90  Last Office Visit and Type: 10/28/2017 with Rene Kocheregina for an acute  Next Office Visit and Type:   Last Controlled Substance Agreement Date: 04/16/2017  Last UDS:04/16/2017

## 2018-04-06 NOTE — Telephone Encounter (Signed)
Scheduled CPE on 11/1

## 2018-04-07 ENCOUNTER — Ambulatory Visit (INDEPENDENT_AMBULATORY_CARE_PROVIDER_SITE_OTHER): Payer: Self-pay

## 2018-04-07 ENCOUNTER — Encounter (INDEPENDENT_AMBULATORY_CARE_PROVIDER_SITE_OTHER): Payer: Self-pay | Admitting: Orthopedic Surgery

## 2018-04-07 ENCOUNTER — Ambulatory Visit (INDEPENDENT_AMBULATORY_CARE_PROVIDER_SITE_OTHER): Payer: 59 | Admitting: Orthopedic Surgery

## 2018-04-07 VITALS — BP 123/86 | HR 106 | Ht 64.0 in | Wt 118.0 lb

## 2018-04-07 DIAGNOSIS — M79671 Pain in right foot: Secondary | ICD-10-CM | POA: Diagnosis not present

## 2018-04-07 DIAGNOSIS — M79672 Pain in left foot: Secondary | ICD-10-CM

## 2018-04-07 MED ORDER — DICLOFENAC POTASSIUM 50 MG PO TABS: 50.0000 mg | ORAL_TABLET | Freq: Two times a day (BID) | ORAL | 1 refills | Status: DC

## 2018-04-07 NOTE — Progress Notes (Signed)
Office Visit Note   Patient: Kelly Walton           Date of Birth: 07-27-1971           MRN: [id number] Visit Date: 04/07/2018              Requested by: Doreene Nestlark, Katherine K, NP [address] [place], [place] [address] PCP: Doreene Nestlark, Katherine K, NP   Assessment & Plan: Visit Diagnoses:  1. Pain in right foot   2. Pain in left foot     Plan:  #1: Given bilateral arch supports #2: If this is not beneficial then possibly MRI scanning would be our next evaluation. #3: Prescription for oral diclofenac given.  Follow-Up Instructions: Return if symptoms worsen or fail to improve.   Orders:  Orders Placed This Encounter  Procedures  . XR Foot Complete Left  . XR Foot Complete Right   Meds ordered this encounter  Medications  . diclofenac (CATAFLAM) 50 MG tablet    Sig: Take 1 tablet (50 mg total) by mouth 2 (two) times daily.    Dispense:  60 tablet    Refill:  1    Order Specific Question:   Supervising Provider    Answer:   Valeria BatmanWHITFIELD, PETER W [[id number]]      Procedures: No procedures performed   Clinical Data: No additional findings.   Subjective: Chief Complaint  Patient presents with  . Follow-up    BIL FOOT PAIN HAD SURGERY ON R FOOT HAD PINS PUT IN BUT HAD THEM TAKEN OUT. SEEN PODIATRIST GIVEN PADS AND WANTED TO DO TEST BUT IT WAS 300 DOLLARS NOT COVERED BY INSURANCE. UNABLE TO WEAR HEELS AND SOME SHOES    HPI  [name] is a 47 year old white female who is seen today for evaluation of both feet.  She has had a previous fracture in the right distal fifth metatarsal years ago and had pins placed initially and these were pulled.  This is more along the distal fifth.  She has seen a podiatrist was given certain pads and form fitted arch supports which actually caused her more symptoms.  She is unable to wear shoes because of this.  Apparently he did want to perform some other type of testing which will cost around $300 and she had refused that.  Review of Systems    Constitutional: Negative for fatigue and fever.  HENT: Negative for ear pain.   Eyes: Negative for pain.  Respiratory: Negative for cough and shortness of breath.   Cardiovascular: Positive for leg swelling.  Gastrointestinal: Negative for constipation and diarrhea.  Genitourinary: Negative for difficulty urinating.  Musculoskeletal: Negative for back pain and neck pain.  Skin: Positive for rash.  Allergic/Immunologic: Negative for food allergies.  Neurological: Positive for weakness and numbness.  Hematological: Bruises/bleeds easily.  Psychiatric/Behavioral: Positive for sleep disturbance.     Objective: Vital Signs: BP 123/86 (BP Location: Left Arm, Patient Position: Sitting, Cuff Size: Normal)   Pulse (!) 106   Ht 5\' 4"  (1.626 m)   Wt 118 lb (53.5 kg)   LMP 03/05/2014   BMI 20.25 kg/m   Physical Exam  Constitutional: She is oriented to person, place, and time. She appears well-developed and well-nourished.  HENT:  Mouth/Throat: Oropharynx is clear and moist.  Eyes: Pupils are equal, round, and reactive to light. EOM are normal.  Pulmonary/Chest: Effort normal.  Neurological: She is alert and oriented to person, place, and time.  Skin: Skin is warm and dry.  Psychiatric: She has a normal mood and affect. Her behavior is normal.    Ortho Exam  Exam of the right foot is essentially benign.  She does have some pain to palpation along the plantar fascial area but this is minimal.  She does have a high arch noted.  No ecchymosis or erythema.  Neurovascular intact distally.  Exam of the left foot is essentially benign except for some prominence over the distal fifth metatarsal laterally.  Also has some plantar pain over the plantar fascial areas but again this is minimal also.  No ecchymosis or erythema.  Neurovascular intact distally.  Specialty Comments:  No specialty comments available.  Imaging: Xr Foot Complete Left  Result Date: 04/07/2018 Three-view x-ray of the  left foot reveals may be some early degenerative changes MTP joint.  There is a cyst on the distal first metatarsal.  No occult fractures are noted.  Xr Foot Complete Right  Result Date: 04/07/2018 Three-view x-ray of the right foot does not reveal any occult fractures.    PMFS History: Current Outpatient Medications  Medication Sig Dispense Refill  . albuterol (PROVENTIL HFA;VENTOLIN HFA) 108 (90 BASE) MCG/ACT inhaler Inhale 2 puffs into the lungs daily as needed for wheezing or shortness of breath. For shortness of breath    . amLODipine (NORVASC) 10 MG tablet TAKE ONE TABLET (10 MG TOTAL) BY MOUTH DAILY. 90 tablet 2  . cetirizine (ZYRTEC) 10 MG tablet Take 10 mg by mouth daily.    . montelukast (SINGULAIR) 10 MG tablet Take 10 mg by mouth at bedtime.    Marland Kitchen zolpidem (AMBIEN) 5 MG tablet TAKE 1 TABLET BY MOUTH EVERY DAY AT BEDTIME AS NEEDED 90 tablet 0  . augmented betamethasone dipropionate (DIPROLENE-AF) 0.05 % cream APPLY TO AFFECTED AREAS TWICE A DAY UNTIL CLEAR  0  . Clobetasol Propionate 0.05 % shampoo USE TO APPLY TO SCALP AS DIRECTED  3  . diclofenac (CATAFLAM) 50 MG tablet Take 1 tablet (50 mg total) by mouth 2 (two) times daily. 60 tablet 1  . ketoconazole (NIZORAL) 2 % shampoo USE AS DIRECTED AS SHAMPOO  2  . traMADol (ULTRAM) 50 MG tablet Take 1 tablet (50 mg total) by mouth every 8 (eight) hours as needed. (Patient not taking: Reported on 04/07/2018) 30 tablet 0   No current facility-administered medications for this visit.     Patient Active Problem List   Diagnosis Date Noted  . Acute respiratory infection 08/20/2017  . Asthma 08/20/2017  . Pain of upper abdomen 06/21/2017  . Encounter for annual general medical examination with abnormal findings in adult 06/21/2017  . Insomnia 04/16/2017  . Foot pain 04/16/2017  . Essential hypertension 04/16/2017   Past Medical History:  Diagnosis Date  . Alcohol abuse   . Asthma   . Hypertension   . Insomnia     Family History   Problem Relation Age of Onset  . Arthritis Mother   . Arthritis Father   . Lung cancer Father   . Hyperlipidemia Maternal Grandmother   . Stroke Maternal Grandmother   . Hypertension Maternal Grandmother   . Alcohol abuse Paternal Grandmother   . Arthritis Paternal Grandmother   . Mental illness Paternal Grandmother   . Breast cancer Maternal Aunt     Past Surgical History:  Procedure Laterality Date  . CESAREAN SECTION    . FRACTURE SURGERY    . ROBOTIC ASSISTED TOTAL HYSTERECTOMY N/A 03/05/2014   Procedure: ROBOTIC ASSISTED TOTAL HYSTERECTOMY/BILATERAL SALPINGECTOMY;  Surgeon: Nena Jordan  Cathie Beams, MD;  Location: [facility];  Service: Gynecology;  Laterality: N/A;  . TUBAL LIGATION     Social History   Occupational History  . Not on file  Tobacco Use  . Smoking status: Never Smoker  . Smokeless tobacco: Never Used  Substance and Sexual Activity  . Alcohol use: Yes    Comment: occasion  . Drug use: No  . Sexual activity: Not on file

## 2018-04-26 ENCOUNTER — Ambulatory Visit (INDEPENDENT_AMBULATORY_CARE_PROVIDER_SITE_OTHER): Payer: 59 | Admitting: Orthopaedic Surgery

## 2018-04-26 ENCOUNTER — Ambulatory Visit (INDEPENDENT_AMBULATORY_CARE_PROVIDER_SITE_OTHER): Payer: Self-pay

## 2018-04-26 ENCOUNTER — Encounter (INDEPENDENT_AMBULATORY_CARE_PROVIDER_SITE_OTHER): Payer: Self-pay

## 2018-04-26 ENCOUNTER — Encounter (INDEPENDENT_AMBULATORY_CARE_PROVIDER_SITE_OTHER): Payer: Self-pay | Admitting: Orthopaedic Surgery

## 2018-04-26 VITALS — BP 118/82 | HR 102 | Ht 65.0 in | Wt 120.0 lb

## 2018-04-26 DIAGNOSIS — M25571 Pain in right ankle and joints of right foot: Secondary | ICD-10-CM

## 2018-04-26 NOTE — Progress Notes (Signed)
Office Visit Note   Patient: Kelly Walton           Date of Birth: [birth year]           MRN: [id number] Visit Date: 04/26/2018              Requested by: Doreene Nestlark, Katherine K, NP [address] [place], [place] [address] PCP: Doreene Nestlark, Katherine K, NP   Assessment & Plan: Visit Diagnoses:  1. Pain in right ankle and joints of right foot     Plan: Minimal tuft fracture right great toe distal phalanx.  Wooden shoe.  Advil and Tylenol for pain.  Office 4 to 5 weeks if necessary.  Follow-Up Instructions: No follow-ups on file.   Orders:  Orders Placed This Encounter  Procedures  . XR Foot Complete Right   No orders of the defined types were placed in this encounter.     Procedures: No procedures performed   Clinical Data: No additional findings.   Subjective: Chief Complaint  Patient presents with  . Follow-up    stummped toe while falling over dog r great toe pain possible fx  Mrs. Kelly Walton visited the office today for acute onset of right great toe pain.  Last night she tripped over her dog and stopped her great toe against a wall and simultaneously "something heavy dropped on the top of my toe".  Pain is localized on the tip of the great toe.  She is obviously had difficulty bearing weight and is concerned that she may have her fracture  HPI  Review of Systems  Constitutional: Negative for fatigue and fever.  HENT: Negative for ear pain.   Eyes: Negative for pain.  Respiratory: Negative for cough and shortness of breath.   Cardiovascular: Positive for leg swelling.  Gastrointestinal: Negative for constipation and diarrhea.  Genitourinary: Negative for difficulty urinating.  Musculoskeletal: Positive for back pain. Negative for neck pain.  Skin: Negative for rash.  Allergic/Immunologic: Negative for food allergies.  Neurological: Positive for weakness and numbness.  Hematological: Bruises/bleeds easily.  Psychiatric/Behavioral: Positive for sleep disturbance.      Objective: Vital Signs: BP 118/82 (BP Location: Left Arm, Patient Position: Sitting, Cuff Size: Normal)   Pulse (!) 102   Ht 5\' 5"  (1.651 m)   Wt 120 lb (54.4 kg)   LMP 03/05/2014   BMI 19.97 kg/m   Physical Exam  Constitutional: She is oriented to person, place, and time. She appears well-developed and well-nourished.  HENT:  Mouth/Throat: Oropharynx is clear and moist.  Eyes: Pupils are equal, round, and reactive to light. EOM are normal.  Pulmonary/Chest: Effort normal.  Neurological: She is alert and oriented to person, place, and time.  Skin: Skin is warm and dry.  Psychiatric: She has a normal mood and affect. Her behavior is normal.    Ortho Exam awake alert and oriented x3.  Comfortable sitting.  Examination of the right toe reveals some swelling and early ecchymosis at the very tip of the toe.  No deformity.  No open areas.  No pain along the proximal phalanx or the metatarsal phalangeal joint. Wearing dark nail polish so unable to assess if there is a subungual hematoma Specialty Comments:  No specialty comments available.  Imaging: No results found.   PMFS History: Patient Active Problem List   Diagnosis Date Noted  . Acute respiratory infection 08/20/2017  . Asthma 08/20/2017  . Pain of upper abdomen 06/21/2017  . Encounter for annual general medical examination with  abnormal findings in adult 06/21/2017  . Insomnia 04/16/2017  . Foot pain 04/16/2017  . Essential hypertension 04/16/2017   Past Medical History:  Diagnosis Date  . Alcohol abuse   . Asthma   . Hypertension   . Insomnia     Family History  Problem Relation Age of Onset  . Arthritis Mother   . Arthritis Father   . Lung cancer Father   . Hyperlipidemia Maternal Grandmother   . Stroke Maternal Grandmother   . Hypertension Maternal Grandmother   . Alcohol abuse Paternal Grandmother   . Arthritis Paternal Grandmother   . Mental illness Paternal Grandmother   . Breast cancer  Maternal Aunt     Past Surgical History:  Procedure Laterality Date  . CESAREAN SECTION    . FRACTURE SURGERY    . ROBOTIC ASSISTED TOTAL HYSTERECTOMY N/A 03/05/2014   Procedure: ROBOTIC ASSISTED TOTAL HYSTERECTOMY/BILATERAL SALPINGECTOMY;  Surgeon: Serita KyleSheronette A Cousins, MD;  Location: [facility];  Service: Gynecology;  Laterality: N/A;  . TUBAL LIGATION     Social History   Occupational History  . Not on file  Tobacco Use  . Smoking status: Never Smoker  . Smokeless tobacco: Never Used  Substance and Sexual Activity  . Alcohol use: Yes    Comment: occasion  . Drug use: No  . Sexual activity: Not on file

## 2018-05-10 ENCOUNTER — Encounter (INDEPENDENT_AMBULATORY_CARE_PROVIDER_SITE_OTHER): Payer: Self-pay

## 2018-05-11 NOTE — Telephone Encounter (Signed)
Worthwhile to see in office to check "knot"

## 2018-05-17 DIAGNOSIS — L218 Other seborrheic dermatitis: Secondary | ICD-10-CM | POA: Diagnosis not present

## 2018-05-17 DIAGNOSIS — L089 Local infection of the skin and subcutaneous tissue, unspecified: Secondary | ICD-10-CM | POA: Diagnosis not present

## 2018-05-23 DIAGNOSIS — L309 Dermatitis, unspecified: Secondary | ICD-10-CM | POA: Diagnosis not present

## 2018-05-23 DIAGNOSIS — Z5181 Encounter for therapeutic drug level monitoring: Secondary | ICD-10-CM | POA: Diagnosis not present

## 2018-06-10 ENCOUNTER — Ambulatory Visit (INDEPENDENT_AMBULATORY_CARE_PROVIDER_SITE_OTHER): Payer: 59 | Admitting: Family Medicine

## 2018-06-10 ENCOUNTER — Encounter: Payer: Self-pay | Admitting: Family Medicine

## 2018-06-10 ENCOUNTER — Ambulatory Visit (INDEPENDENT_AMBULATORY_CARE_PROVIDER_SITE_OTHER)
Admission: RE | Admit: 2018-06-10 | Discharge: 2018-06-10 | Disposition: A | Discharge status: Home / Self Care | Payer: 59 | Source: Ambulatory Visit | Attending: Family Medicine | Admitting: Family Medicine

## 2018-06-10 ENCOUNTER — Encounter: Payer: Self-pay | Admitting: *Deleted

## 2018-06-10 ENCOUNTER — Ambulatory Visit: Payer: Self-pay | Admitting: *Deleted

## 2018-06-10 VITALS — BP 120/82 | HR 110 | Temp 98.7°F | Ht 64.0 in | Wt 122.5 lb

## 2018-06-10 DIAGNOSIS — R1031 Right lower quadrant pain: Secondary | ICD-10-CM

## 2018-06-10 DIAGNOSIS — R197 Diarrhea, unspecified: Secondary | ICD-10-CM | POA: Diagnosis not present

## 2018-06-10 HISTORY — DX: Right lower quadrant pain: R10.31

## 2018-06-10 LAB — CBC WITH DIFFERENTIAL/PLATELET
BASOS ABS: 0 10*3/uL (ref 0.0–0.1)
BASOS PCT: 0.3 % (ref 0.0–3.0)
EOS ABS: 0.1 10*3/uL (ref 0.0–0.7)
EOS PCT: 2.3 % (ref 0.0–5.0)
HCT: 42.1 % (ref 36.0–46.0)
Hemoglobin: 14.2 g/dL (ref 12.0–15.0)
LYMPHS ABS: 1.6 10*3/uL (ref 0.7–4.0)
Lymphocytes Relative: 27.1 % (ref 12.0–46.0)
MCHC: 33.7 g/dL (ref 30.0–36.0)
MCV: 91.8 fl (ref 78.0–100.0)
MONO ABS: 0.4 10*3/uL (ref 0.1–1.0)
MONOS PCT: 6.3 % (ref 3.0–12.0)
NEUTROS ABS: 3.9 10*3/uL (ref 1.4–7.7)
NEUTROS PCT: 64 % (ref 43.0–77.0)
PLATELETS: 259 10*3/uL (ref 150.0–400.0)
RBC: 4.59 Mil/uL (ref 3.87–5.11)
RDW: 14.1 % (ref 11.5–15.5)
WBC: 6 10*3/uL (ref 4.0–10.5)

## 2018-06-10 LAB — COMPREHENSIVE METABOLIC PANEL
ALT: 31 U/L (ref 0–35)
AST: 31 U/L (ref 0–37)
Albumin: 4.5 g/dL (ref 3.5–5.2)
Alkaline Phosphatase: 83 U/L (ref 39–117)
BUN: 8 mg/dL (ref 6–23)
CALCIUM: 10.1 mg/dL (ref 8.4–10.5)
CHLORIDE: 102 meq/L (ref 96–112)
CO2: 29 meq/L (ref 19–32)
CREATININE: 0.89 mg/dL (ref 0.40–1.20)
GFR: 72.49 mL/min (ref >60.00)
GLUCOSE: 114 mg/dL — AB (ref 70–99)
Potassium: 4.4 mEq/L (ref 3.5–5.1)
Sodium: 138 mEq/L (ref 135–145)
Total Bilirubin: 0.6 mg/dL (ref 0.2–1.2)
Total Protein: 7.8 g/dL (ref 6.0–8.3)

## 2018-06-10 LAB — POC URINALSYSI DIPSTICK (AUTOMATED)
Bilirubin, UA: NEGATIVE
Blood, UA: NEGATIVE
GLUCOSE UA: NEGATIVE
Ketones, UA: NEGATIVE
LEUKOCYTES UA: NEGATIVE
Nitrite, UA: NEGATIVE
PROTEIN UA: NEGATIVE
Spec Grav, UA: 1.01 (ref 1.010–1.025)
UROBILINOGEN UA: 0.2 U/dL
pH, UA: 6 (ref 5.0–8.0)

## 2018-06-10 LAB — LIPASE: Lipase: 13 U/L (ref 11.0–59.0)

## 2018-06-10 MED ORDER — IOPAMIDOL (ISOVUE-300) INJECTION 61%
100.0000 mL | Freq: Once | INTRAVENOUS | Status: AC | PRN
  Administered 2018-06-10: 100 mL via INTRAVENOUS

## 2018-06-10 NOTE — Assessment & Plan Note (Signed)
Concern for appendicitis, less likely ovarian cyst or SE yto new methotrexate  S/p hysterectomy, partial.  UA clear  Eval with labs and stat CT.

## 2018-06-10 NOTE — Telephone Encounter (Signed)
Pt called with complaints of right lower abdominal pain which stated 1100 06/09/18; recommendations made per nurse triage protocol to include seeing a physician within 4 hours; the pt states that she has to be at work at 1600 today; she also sees Vernona Rieger but this provider has no availability within parameter guidelines; pt offered and accepted appointment with Dr Ermalene Searing, [facility], 06/10/18 at 1045; she verbalizes understanding; will route to office for notification of this upcoming appointment.   Reason for Disposition . [1] MILD-MODERATE pain AND [2] constant AND [3] present > 2 hours  Answer Assessment - Initial Assessment Questions 1. LOCATION: "Where does it hurt?"      Right lower 2. RADIATION: "Does the pain shoot anywhere else?" (e.g., chest, back)     no  3. ONSET: "When did the pain begin?" (e.g., minutes, hours or days ago)      06/09/18 pm 4. SUDDEN: "Gradual or sudden onset?"     Gradual  5. PATTERN "Does the pain come and go, or is it constant?"    - If constant: "Is it getting better, staying the same, or worsening?"      (Note: Constant means the pain never goes away completely; most serious pain is constant and it progresses)     - If intermittent: "How long does it last?" "Do you have pain now?"     (Note: Intermittent means the pain goes away completely between bouts)     Constant; worsening 6. SEVERITY: "How bad is the pain?"  (e.g., Scale 1-10; mild, moderate, or severe)   - MILD (1-3): doesn't interfere with normal activities, abdomen soft and not tender to touch    - MODERATE (4-7): interferes with normal activities or awakens from sleep, tender to touch    - SEVERE (8-10): excruciating pain, doubled over, unable to do any normal activities      Moderate; tender to touch 7. RECURRENT SYMPTOM: "Have you ever had this type of abdominal pain before?" If so, ask: "When was the last time?" and "What happened that time?"      no 8. CAUSE: "What do you think is  causing the abdominal pain?"     Not sure 9. RELIEVING/AGGRAVATING FACTORS: "What makes it better or worse?" (e.g., movement, antacids, bowel movement)     Movement makes it worse; comforting to lay in the fetal position  10. OTHER SYMPTOMS: "Has there been any vomiting, diarrhea, constipation, or urine problems?"       Bloating; diarrhea x 2 days (started 10/2 and 10/3) 11. PREGNANCY: "Is there any chance you are pregnant?" "When was your last menstrual period?"       No partial hysterectomy  Protocols used: ABDOMINAL PAIN - [facility]

## 2018-06-10 NOTE — Telephone Encounter (Signed)
Pt has appt with Dr Ermalene Searing 06/10/18 at 10:45/

## 2018-06-10 NOTE — Progress Notes (Signed)
Subjective:    Patient ID: Kelly Walton, female    DOB: 1970/12/22, 47 y.o.   MRN: [id number]  Abdominal Pain  This is a new problem. The current episode started yesterday. The onset quality is sudden. The problem occurs constantly. The problem has been gradually worsening. The pain is located in the RUQ. The pain is at a severity of 8/10. The pain is moderate. The quality of the pain is dull and sharp. The abdominal pain radiates to the RLQ. Associated symptoms include anorexia, diarrhea and nausea. Pertinent negatives include no belching, dysuria, fever, flatus, frequency, hematochezia, hematuria, melena, myalgias or vomiting. Associated symptoms comments: Ongoing x 2 days. The pain is aggravated by certain positions ( flat on back). The pain is relieved by certain positions ( fetal postition feels better). Treatments tried:  BC did not help. The treatment provided no relief. Her past medical history is significant for abdominal surgery. There is no history of colon cancer, Crohn's disease, gallstones, GERD, irritable bowel syndrome, pancreatitis, PUD or ulcerative colitis.    Started on methotrexate last Friday.  Severe psoriasis.   She had partial hysterectomy. Blood pressure 120/82, pulse (!) 110, temperature 98.7 F (37.1 C), temperature source Oral, height 5\' 4"  (1.626 m), weight 122 lb 8 oz (55.6 kg), last menstrual period 03/05/2014. Social History /Family History/Past Medical History reviewed in detail and updated in EMR if needed.    Review of Systems  Constitutional: Negative for fever.  Gastrointestinal: Positive for abdominal pain, anorexia, diarrhea and nausea. Negative for flatus, hematochezia, melena and vomiting.  Genitourinary: Negative for dysuria, frequency and hematuria.  Musculoskeletal: Negative for myalgias.       Objective:   Physical Exam  Constitutional: Vital signs are normal. She appears well-developed and well-nourished. She is cooperative.  Non-toxic  appearance. She does not appear ill. No distress.  HENT:  Head: Normocephalic.  Right Ear: Hearing, tympanic membrane, external ear and ear canal normal. Tympanic membrane is not erythematous, not retracted and not bulging.  Left Ear: Hearing, tympanic membrane, external ear and ear canal normal. Tympanic membrane is not erythematous, not retracted and not bulging.  Nose: No mucosal edema or rhinorrhea. Right sinus exhibits no maxillary sinus tenderness and no frontal sinus tenderness. Left sinus exhibits no maxillary sinus tenderness and no frontal sinus tenderness.  Mouth/Throat: Uvula is midline, oropharynx is clear and moist and mucous membranes are normal.  Eyes: Pupils are equal, round, and reactive to light. Conjunctivae, EOM and lids are normal. Lids are everted and swept, no foreign bodies found.  Neck: Trachea normal and normal range of motion. Neck supple. Carotid bruit is not present. No thyroid mass and no thyromegaly present.  Cardiovascular: Normal rate, regular rhythm, S1 normal, S2 normal, normal heart sounds, intact distal pulses and normal pulses. Exam reveals no gallop and no friction rub.  No murmur heard. Pulmonary/Chest: Effort normal and breath sounds normal. No tachypnea. No respiratory distress. She has no decreased breath sounds. She has no wheezes. She has no rhonchi. She has no rales.  Abdominal: Soft. Normal appearance and bowel sounds are normal. There is tenderness in the right upper quadrant and right lower quadrant. There is guarding. There is no rigidity and no CVA tenderness.  Neurological: She is alert.  Skin: Skin is warm, dry and intact. No rash noted.  Psychiatric: Her speech is normal and behavior is normal. Judgment and thought content normal. Her mood appears not anxious. Cognition and memory are normal. She does not exhibit a  depressed mood.          Assessment & Plan:

## 2018-06-10 NOTE — Patient Instructions (Signed)
Please stop at the lab to have labs drawn. Please stop at the front desk to set up referral. No food and drink until CT.  If severe pain or new fever while waiting for evaluation.. Go to ER.

## 2018-06-23 ENCOUNTER — Other Ambulatory Visit: Payer: Self-pay | Admitting: Primary Care

## 2018-06-23 DIAGNOSIS — Z0000 Encounter for general adult medical examination without abnormal findings: Secondary | ICD-10-CM

## 2018-06-28 ENCOUNTER — Other Ambulatory Visit: Payer: Self-pay | Admitting: Primary Care

## 2018-06-28 DIAGNOSIS — I10 Essential (primary) hypertension: Secondary | ICD-10-CM

## 2018-07-01 ENCOUNTER — Other Ambulatory Visit: Payer: 59

## 2018-07-01 ENCOUNTER — Telehealth: Payer: Self-pay | Admitting: Primary Care

## 2018-07-01 ENCOUNTER — Other Ambulatory Visit (INDEPENDENT_AMBULATORY_CARE_PROVIDER_SITE_OTHER): Payer: 59

## 2018-07-01 DIAGNOSIS — Z0000 Encounter for general adult medical examination without abnormal findings: Secondary | ICD-10-CM

## 2018-07-01 DIAGNOSIS — G4700 Insomnia, unspecified: Secondary | ICD-10-CM

## 2018-07-01 LAB — HEMOGLOBIN A1C: HEMOGLOBIN A1C: 5.2 % (ref 4.6–6.5)

## 2018-07-01 LAB — LDL CHOLESTEROL, DIRECT: LDL DIRECT: 74 mg/dL

## 2018-07-01 LAB — LIPID PANEL
Cholesterol: 228 mg/dL — ABNORMAL HIGH (ref 0–200)
HDL: 99.7 mg/dL (ref >39.00)
Total CHOL/HDL Ratio: 2

## 2018-07-01 MED ORDER — ZOLPIDEM TARTRATE 5 MG PO TABS: ORAL_TABLET | ORAL | 0 refills | Status: DC

## 2018-07-01 NOTE — Telephone Encounter (Signed)
Name of Medication: zolpidem (AMBIEN) 5 mg  Name of [facility]   Last Fill or Written Date and Quantity: 04/05/2018 #90  Last Office Visit and Type: 06/10/2018 acute  Next Office Visit and Type: 07/08/2018 CPE  Last Controlled Substance Agreement Date: 04/16/2017  Last UDS:

## 2018-07-01 NOTE — Telephone Encounter (Signed)
Copied from CRM [id number]. Topic: Quick Communication - Rx Refill/Question >> Jul 01, 2018 12:08 PM Luanna Cole wrote: Medication: zolpidem (AMBIEN) 5 MG tablet [[id number]]   Has the patient contacted their pharmacy?no Preferred Pharmacy (with phone number or street name): [phone] [address] [address], [place] - [address] [phone] (Phone) [phone] (Fax)    Agent: Please be advised that RX refills may take up to 3 business days. We ask that you follow-up with your pharmacy.

## 2018-07-01 NOTE — Telephone Encounter (Signed)
Noted, refill sent to pharmacy. 

## 2018-07-08 ENCOUNTER — Ambulatory Visit (INDEPENDENT_AMBULATORY_CARE_PROVIDER_SITE_OTHER): Payer: 59 | Admitting: Primary Care

## 2018-07-08 ENCOUNTER — Encounter: Payer: Self-pay | Admitting: Primary Care

## 2018-07-08 VITALS — BP 130/82 | HR 100 | Temp 98.0°F | Ht 64.0 in | Wt 123.2 lb

## 2018-07-08 DIAGNOSIS — J4520 Mild intermittent asthma, uncomplicated: Secondary | ICD-10-CM

## 2018-07-08 DIAGNOSIS — E785 Hyperlipidemia, unspecified: Secondary | ICD-10-CM | POA: Insufficient documentation

## 2018-07-08 DIAGNOSIS — Z1239 Encounter for other screening for malignant neoplasm of breast: Secondary | ICD-10-CM

## 2018-07-08 DIAGNOSIS — G4700 Insomnia, unspecified: Secondary | ICD-10-CM

## 2018-07-08 DIAGNOSIS — Z0000 Encounter for general adult medical examination without abnormal findings: Secondary | ICD-10-CM

## 2018-07-08 DIAGNOSIS — L309 Dermatitis, unspecified: Secondary | ICD-10-CM

## 2018-07-08 DIAGNOSIS — I10 Essential (primary) hypertension: Secondary | ICD-10-CM

## 2018-07-08 DIAGNOSIS — L308 Other specified dermatitis: Secondary | ICD-10-CM | POA: Insufficient documentation

## 2018-07-08 NOTE — Assessment & Plan Note (Signed)
Very high triglycerides on recent labs, she was not fasting.  Will have her return at her convenience for fasting lipid panel. HDL of 99.

## 2018-07-08 NOTE — Assessment & Plan Note (Signed)
Stable in the office today, continue amlodipine. Discussed to start exercising regularly.

## 2018-07-08 NOTE — Assessment & Plan Note (Signed)
Following with dermatology, managed on methotrexate x 4 weeks. Continue same.

## 2018-07-08 NOTE — Assessment & Plan Note (Addendum)
Immunizations UTD. Mammogram due, ordered. History of hysterectomy. Recommended to work on diet, start with regular exercise. Exam unremarkable. Labs reviewed. Follow up in 1 year for CPE

## 2018-07-08 NOTE — Patient Instructions (Signed)
[facility] your [facility].  Start exercising. You should be getting 150 minutes of moderate intensity exercise weekly.  Continue to work on a healthy diet. Eat plenty of vegetables, fruit, whole grains, lean protein.   Ensure you are consuming 64 ounces of water daily.  Schedule a lab only appointment to repeat your cholesterol level. Make sure you are fasting 4 hours prior to labs. You may have water and black coffee.   It was a pleasure to see you today!   Preventive Care 40-64 Years, Female Preventive care refers to lifestyle choices and visits with your health care provider that can promote health and wellness. What does preventive care include?  A yearly physical exam. This is also called an annual well check.  Dental exams once or twice a year.  Routine eye exams. Ask your health care provider how often you should have your eyes checked.  Personal lifestyle choices, including: ? Daily care of your teeth and gums. ? Regular physical activity. ? Eating a healthy diet. ? Avoiding tobacco and drug use. ? Limiting alcohol use. ? Practicing safe sex. ? Taking low-dose aspirin daily starting at age 62. ? Taking vitamin and mineral supplements as recommended by your health care provider. What happens during an annual well check? The services and screenings done by your health care provider during your annual well check will depend on your age, overall health, lifestyle risk factors, and family history of disease. Counseling Your health care provider may ask you questions about your:  Alcohol use.  Tobacco use.  Drug use.  Emotional well-being.  Home and relationship well-being.  Sexual activity.  Eating habits.  Work and work Statistician.  Method of birth control.  Menstrual cycle.  Pregnancy history.  Screening You may have the following tests or measurements:  Height, weight, and BMI.  Blood pressure.  Lipid and cholesterol  levels. These may be checked every 5 years, or more frequently if you are over 54 years old.  Skin check.  Lung cancer screening. You may have this screening every year starting at age 70 if you have a 30-pack-year history of smoking and currently smoke or have quit within the past 15 years.  Fecal occult blood test (FOBT) of the stool. You may have this test every year starting at age 73.  Flexible sigmoidoscopy or colonoscopy. You may have a sigmoidoscopy every 5 years or a colonoscopy every 10 years starting at age 93.  Hepatitis C blood test.  Hepatitis B blood test.  Sexually transmitted disease (STD) testing.  Diabetes screening. This is done by checking your blood sugar (glucose) after you have not eaten for a while (fasting). You may have this done every 1-3 years.  Mammogram. This may be done every 1-2 years. Talk to your health care provider about when you should start having regular mammograms. This may depend on whether you have a family history of breast cancer.  BRCA-related cancer screening. This may be done if you have a family history of breast, ovarian, tubal, or peritoneal cancers.  Pelvic exam and Pap test. This may be done every 3 years starting at age 73. Starting at age 26, this may be done every 5 years if you have a Pap test in combination with an HPV test.  Bone density scan. This is done to screen for osteoporosis. You may have this scan if you are at high risk for osteoporosis.  Discuss your test results, treatment options, and if necessary, the  need for more tests with your health care provider. Vaccines Your health care provider may recommend certain vaccines, such as:  Influenza vaccine. This is recommended every year.  Tetanus, diphtheria, and acellular pertussis (Tdap, Td) vaccine. You may need a Td booster every 10 years.  Varicella vaccine. You may need this if you have not been vaccinated.  Zoster vaccine. You may need this after age  24.  Measles, mumps, and rubella (MMR) vaccine. You may need at least one dose of MMR if you were born in 1957 or later. You may also need a second dose.  Pneumococcal 13-valent conjugate (PCV13) vaccine. You may need this if you have certain conditions and were not previously vaccinated.  Pneumococcal polysaccharide (PPSV23) vaccine. You may need one or two doses if you smoke cigarettes or if you have certain conditions.  Meningococcal vaccine. You may need this if you have certain conditions.  Hepatitis A vaccine. You may need this if you have certain conditions or if you travel or work in places where you may be exposed to hepatitis A.  Hepatitis B vaccine. You may need this if you have certain conditions or if you travel or work in places where you may be exposed to hepatitis B.  Haemophilus influenzae type b (Hib) vaccine. You may need this if you have certain conditions.  Talk to your health care provider about which screenings and vaccines you need and how often you need them. This information is not intended to replace advice given to you by your health care provider. Make sure you discuss any questions you have with your health care provider. Document Released: 09/20/2015 Document Revised: 05/13/2016 Document Reviewed: 06/25/2015 Elsevier Interactive Patient Education  [employer].

## 2018-07-08 NOTE — Assessment & Plan Note (Signed)
Doing well on zolpidem 5 mg, continue same. Denies SI/HI.

## 2018-07-08 NOTE — Progress Notes (Signed)
Subjective:    Patient ID: Kelly Walton, female    DOB: 09-Jul-1971, 47 y.o.   MRN: [id number]  HPI  Kelly Walton is a 47 year old female who presents today for complete physical.  Immunizations: -Tetanus: Completed in 2016 -Influenza: Completed this season -Pneumonia: Completed in 2016  Diet: She endorses a fair diet Breakfast: Protein shake with fruit  Lunch: Left overs Dinner: Pasta, meatloaf, salads, vegetables, meat, potates Snacks: Chips, fruit Desserts: Occasionally  Beverages: Water, Coke, pedialite powder  Exercise: She is not exercise  Eye exam: Completes annually  Dental exam: Completes semi-annually  Pap Smear: Hysterectomy  Mammogram: Completed in 2018  BP Readings from Last 3 Encounters:  07/08/18 130/82  06/10/18 120/82  04/26/18 118/82     Review of Systems  Constitutional: Negative for unexpected weight change.  HENT: Negative for rhinorrhea.   Respiratory: Negative for cough and shortness of breath.   Cardiovascular: Negative for chest pain.  Gastrointestinal: Negative for constipation and diarrhea.  Genitourinary: Negative for difficulty urinating.       Decreased sexual drive  Musculoskeletal: Positive for myalgias. Negative for arthralgias.       Muscle aches to bilateral lower extremities   Skin: Negative for rash.  Allergic/Immunologic: Positive for environmental allergies.  Neurological: Negative for dizziness, numbness and headaches.  Psychiatric/Behavioral: Negative for sleep disturbance.       Past Medical History:  Diagnosis Date  . Alcohol abuse   . Asthma   . Hypertension   . Insomnia      Social History   Socioeconomic History  . Marital status: Single    Spouse name: Not on file  . Number of children: Not on file  . Years of education: Not on file  . Highest education level: Not on file  Occupational History  . Not on file  Social Needs  . Financial resource strain: Not on file  . Food insecurity:    Worry: Not  on file    Inability: Not on file  . Transportation needs:    Medical: Not on file    Non-medical: Not on file  Tobacco Use  . Smoking status: Never Smoker  . Smokeless tobacco: Never Used  Substance and Sexual Activity  . Alcohol use: Yes    Comment: occasion  . Drug use: No  . Sexual activity: Not on file  Lifestyle  . Physical activity:    Days per week: Not on file    Minutes per session: Not on file  . Stress: Not on file  Relationships  . Social connections:    Talks on phone: Not on file    Gets together: Not on file    Attends religious service: Not on file    Active member of club or organization: Not on file    Attends meetings of clubs or organizations: Not on file    Relationship status: Not on file  . Intimate partner violence:    Fear of current or ex partner: Not on file    Emotionally abused: Not on file    Physically abused: Not on file    Forced sexual activity: Not on file  Other Topics Concern  . Not on file  Social History Narrative   Single.   2 children, 1 step-son.   Manager at [employer].   Enjoys going to [employer], spending time with family.     Past Surgical History:  Procedure Laterality Date  . CESAREAN SECTION    . FRACTURE SURGERY    .  ROBOTIC ASSISTED TOTAL HYSTERECTOMY N/A 03/05/2014   Procedure: ROBOTIC ASSISTED TOTAL HYSTERECTOMY/BILATERAL SALPINGECTOMY;  Surgeon: Serita Kyle, MD;  Location: [facility];  Service: Gynecology;  Laterality: N/A;  . TUBAL LIGATION      Family History  Problem Relation Age of Onset  . Arthritis Mother   . Arthritis Father   . Lung cancer Father   . Hyperlipidemia Maternal Grandmother   . Stroke Maternal Grandmother   . Hypertension Maternal Grandmother   . Alcohol abuse Paternal Grandmother   . Arthritis Paternal Grandmother   . Mental illness Paternal Grandmother   . Breast cancer Maternal Aunt     Allergies  Allergen Reactions  . Promethazine Hcl Hives and Rash  . Sulfa Antibiotics Hives and  Rash    Pt states sent her to the hospital.    Current Outpatient Medications on File Prior to Visit  Medication Sig Dispense Refill  . albuterol (PROVENTIL HFA;VENTOLIN HFA) 108 (90 BASE) MCG/ACT inhaler Inhale 2 puffs into the lungs daily as needed for wheezing or shortness of breath. For shortness of breath    . amLODipine (NORVASC) 10 MG tablet TAKE 1 TABLET BY MOUTH EVERY DAY 90 tablet 1  . augmented betamethasone dipropionate (DIPROLENE-AF) 0.05 % cream APPLY TO AFFECTED AREAS TWICE A DAY UNTIL CLEAR  0  . cetirizine (ZYRTEC) 10 MG tablet Take 10 mg by mouth daily.    . Clobetasol Propionate 0.05 % shampoo USE TO APPLY TO SCALP AS DIRECTED  3  . fluocinonide (LIDEX) 0.05 % external solution APPLY TO SCALP NIGHTLY  1  . folic acid (FOLVITE) 1 MG tablet TAKE ONE TABLET DAILY EXCEPT FOR DAYS TAKING METHOTREXATE  0  . ketoconazole (NIZORAL) 2 % shampoo USE AS DIRECTED AS SHAMPOO  2  . methotrexate (RHEUMATREX) 2.5 MG tablet TAKE FOUR (4) TABLETS EVERY FRIDAY  0  . montelukast (SINGULAIR) 10 MG tablet Take 10 mg by mouth at bedtime.    . triamcinolone cream (KENALOG) 0.1 % APPLY TO AFFECTED AREA TWICE A DAY AS NEEDED  1  . zolpidem (AMBIEN) 5 MG tablet TAKE 1 TABLET BY MOUTH EVERY DAY AT BEDTIME AS NEEDED 90 tablet 0   No current facility-administered medications on file prior to visit.     BP 130/82   Pulse 100   Temp 98 F (36.7 C) (Oral)   Ht 5\' 4"  (1.626 m)   Wt 123 lb 4 oz (55.9 kg)   LMP 03/05/2014   SpO2 98%   BMI 21.16 kg/m    Objective:   Physical Exam  Constitutional: She is oriented to person, place, and time. She appears well-nourished.  HENT:  Mouth/Throat: No oropharyngeal exudate.  Eyes: Pupils are equal, round, and reactive to light. EOM are normal.  Neck: Neck supple. No thyromegaly present.  Cardiovascular: Normal rate and regular rhythm.  Respiratory: Effort normal and breath sounds normal.  GI: Soft. Bowel sounds are normal. There is no tenderness.    Musculoskeletal: Normal range of motion.  Neurological: She is alert and oriented to person, place, and time.  Skin: Skin is warm and dry.  Psychiatric: She has a normal mood and affect.           Assessment & Plan:

## 2018-07-08 NOTE — Assessment & Plan Note (Addendum)
Stable. Using albuterol inhaler infrequently. Doing well on Singulair and Zyrtec. Continue same. No wheezing on exam.

## 2018-07-19 ENCOUNTER — Encounter: Payer: Self-pay | Admitting: *Deleted

## 2018-07-22 ENCOUNTER — Other Ambulatory Visit (INDEPENDENT_AMBULATORY_CARE_PROVIDER_SITE_OTHER): Payer: 59

## 2018-07-22 DIAGNOSIS — E785 Hyperlipidemia, unspecified: Secondary | ICD-10-CM

## 2018-07-22 LAB — LIPID PANEL
Cholesterol: 239 mg/dL — ABNORMAL HIGH (ref 0–200)
HDL: 110 mg/dL (ref >39.00)
NonHDL: 129.27
Total CHOL/HDL Ratio: 2
Triglycerides: 204 mg/dL — ABNORMAL HIGH (ref 0.0–149.0)
VLDL: 40.8 mg/dL — ABNORMAL HIGH (ref 0.0–40.0)

## 2018-07-22 LAB — LDL CHOLESTEROL, DIRECT: Direct LDL: 87 mg/dL

## 2018-08-01 DIAGNOSIS — J4520 Mild intermittent asthma, uncomplicated: Secondary | ICD-10-CM

## 2018-08-01 MED ORDER — MONTELUKAST SODIUM 10 MG PO TABS: 10.0000 mg | ORAL_TABLET | Freq: Every day | ORAL | 3 refills | Status: DC

## 2018-08-01 NOTE — Telephone Encounter (Signed)
Ok to refill? You have not refill this for patient before. Last seen on 07/08/2018

## 2018-08-22 DIAGNOSIS — Z5181 Encounter for therapeutic drug level monitoring: Secondary | ICD-10-CM | POA: Diagnosis not present

## 2018-08-22 DIAGNOSIS — L309 Dermatitis, unspecified: Secondary | ICD-10-CM | POA: Diagnosis not present

## 2018-09-26 ENCOUNTER — Other Ambulatory Visit: Payer: Self-pay | Admitting: Primary Care

## 2018-09-26 DIAGNOSIS — G4700 Insomnia, unspecified: Secondary | ICD-10-CM

## 2018-09-26 NOTE — Telephone Encounter (Signed)
Name of Medication: zolpidem (AMBIEN) 5 mg  Name of [facility]   Last Fill or Written Date and Quantity: 07/01/2018#90  Last Office Visit and Type: 06/10/2018 acute  Next Office Visit and Type: 07/08/2018 CPE

## 2018-09-26 NOTE — Telephone Encounter (Signed)
Noted, refill sent to pharmacy. 

## 2018-10-10 IMAGING — CT CT ABD-PELV W/ CM
2 of 5 series · 15 of 46 positions shown, 17 images · IV contrast (ISOVUE 300)
Comparison: 01/23/2015, 01/19/2014

CLINICAL DATA: Acute right lower quadrant abdominal pain with
diarrhea and nausea

EXAM:
CT ABDOMEN AND PELVIS WITH CONTRAST
TECHNIQUE: Multidetector CT imaging of the abdomen and pelvis was performed
using the standard protocol following bolus administration of
intravenous contrast.
CONTRAST:  100mL ODVZX5-WAA IOPAMIDOL (ODVZX5-WAA) INJECTION 61%

[Series 2: abd/pel w · axial · 0.67mm/px · z∈[-480,-104]mm · 12 of 85 slices shown, 14 images]
[im 5/85  soft-tissue]
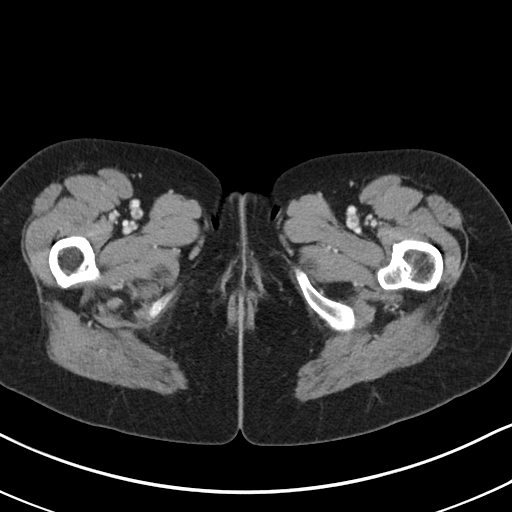
[im 5/85  bone]
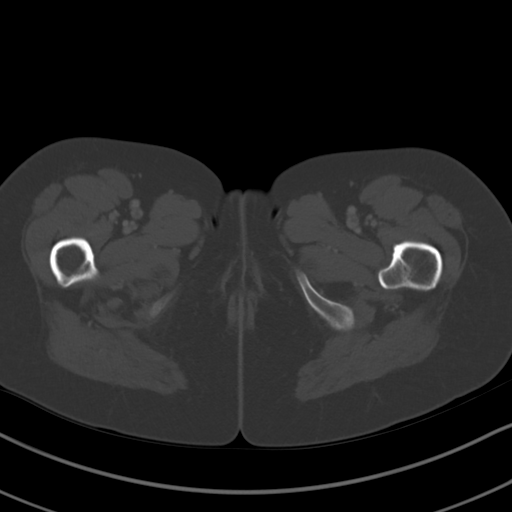
[im 13/85  soft-tissue]
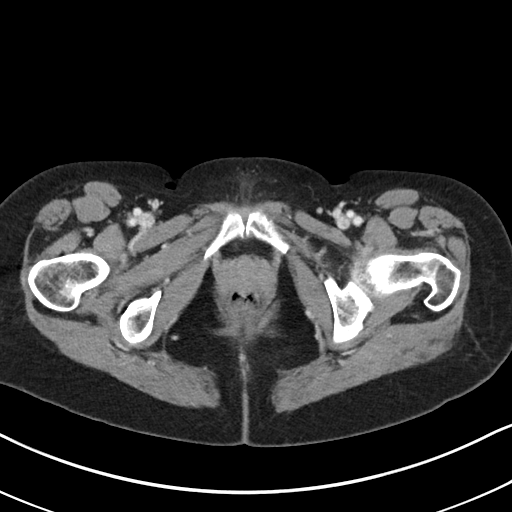
[im 17/85  soft-tissue]
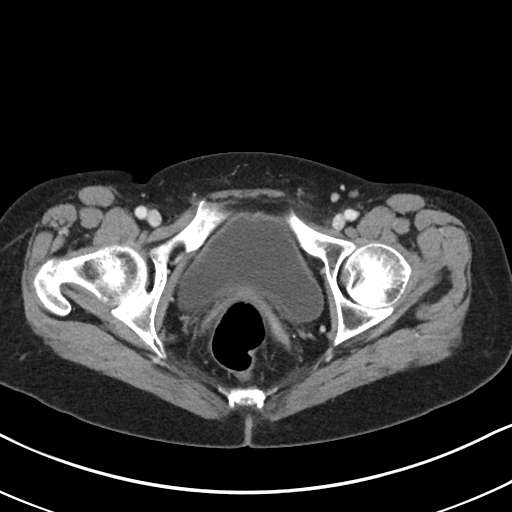
[im 26/85  soft-tissue]
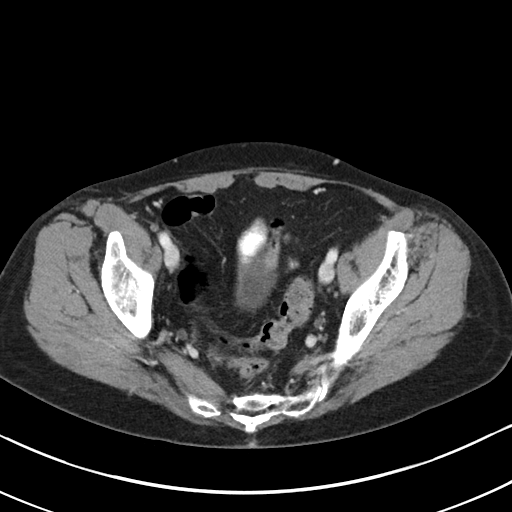
[im 34/85  soft-tissue]
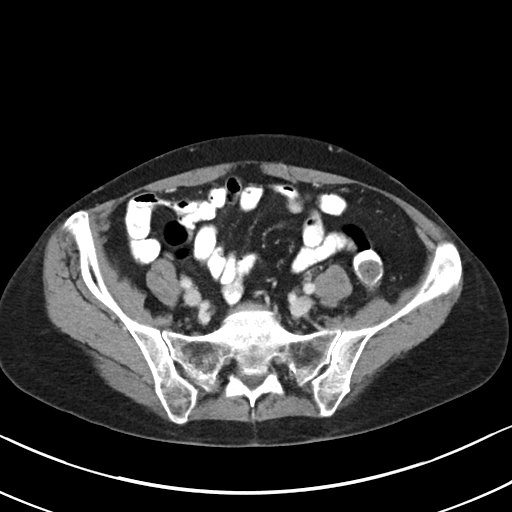
[im 38/85  soft-tissue]
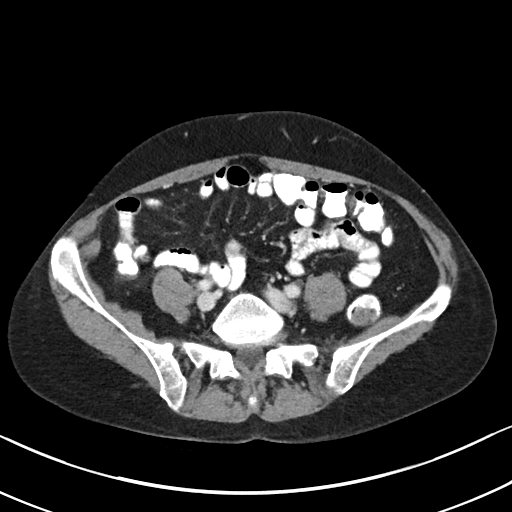
[im 47/85  soft-tissue]
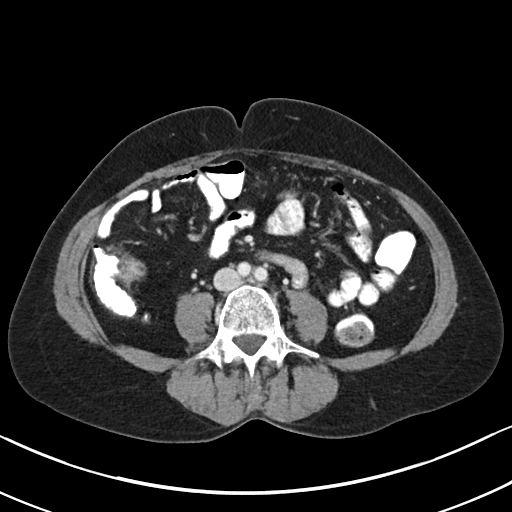
[im 51/85  soft-tissue]
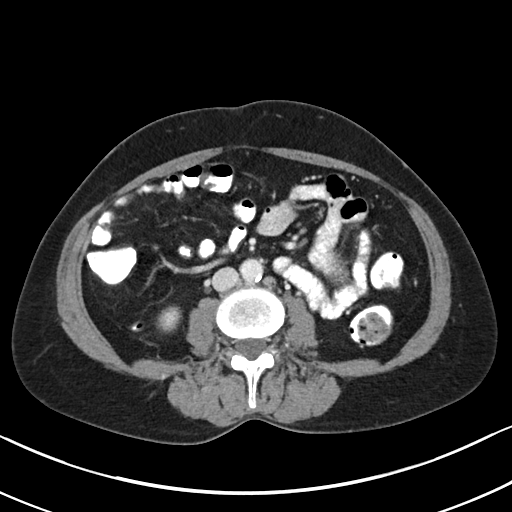
[im 59/85  soft-tissue]
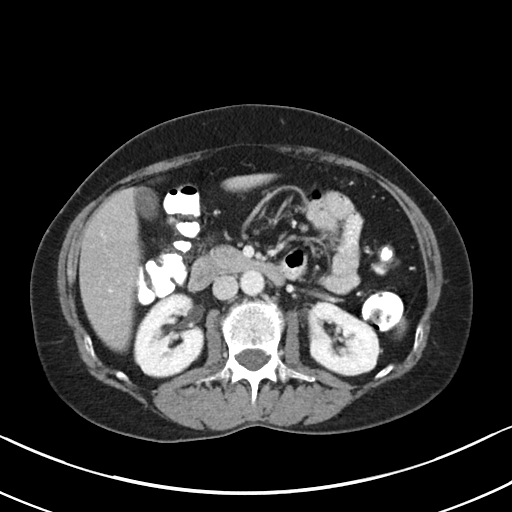
[im 59/85  bone]
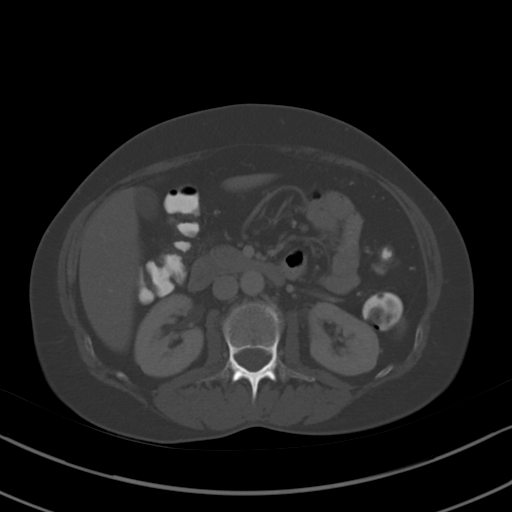
[im 68/85  soft-tissue]
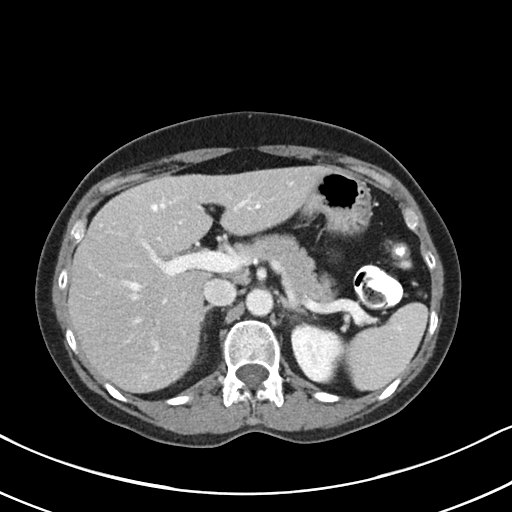
[im 72/85  soft-tissue]
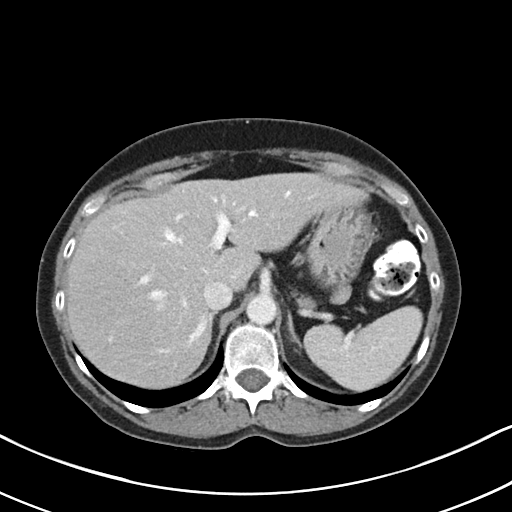
[im 80/85  soft-tissue]
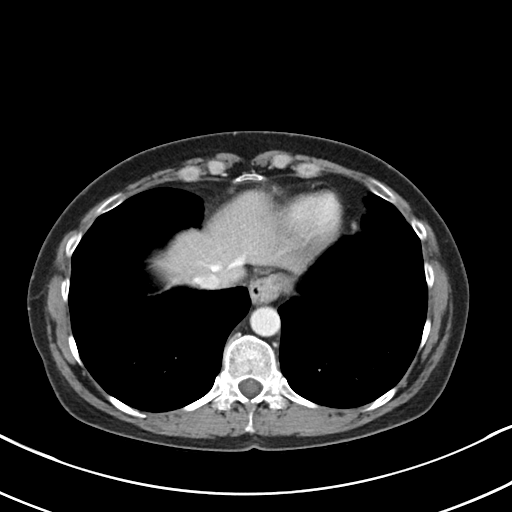

[Series 6: abd/pel w st · coronal · 0.57mm/px · 3 of 77 slices shown]
[im 26/77  soft-tissue]
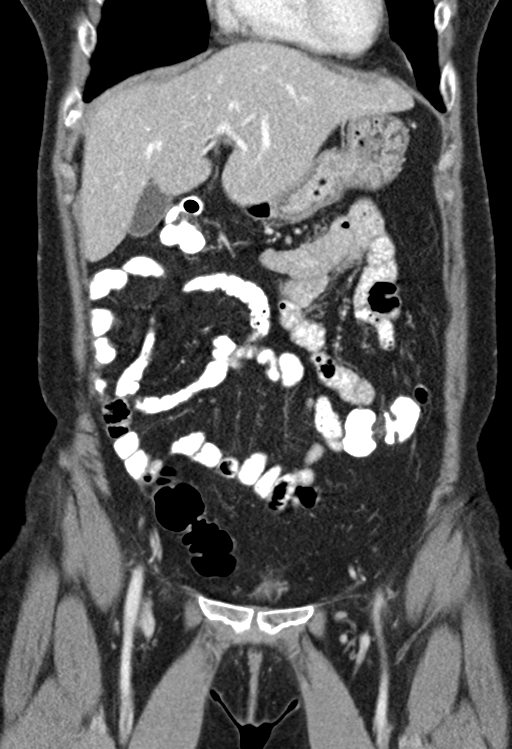
[im 34/77  soft-tissue]
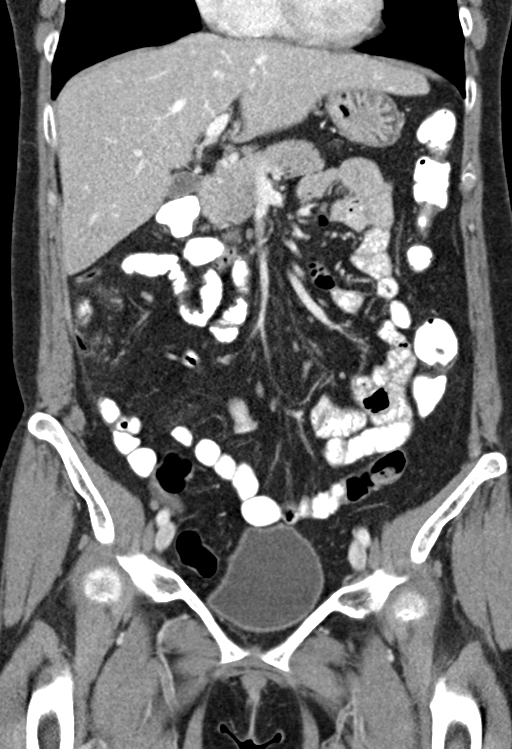
[im 43/77  soft-tissue]
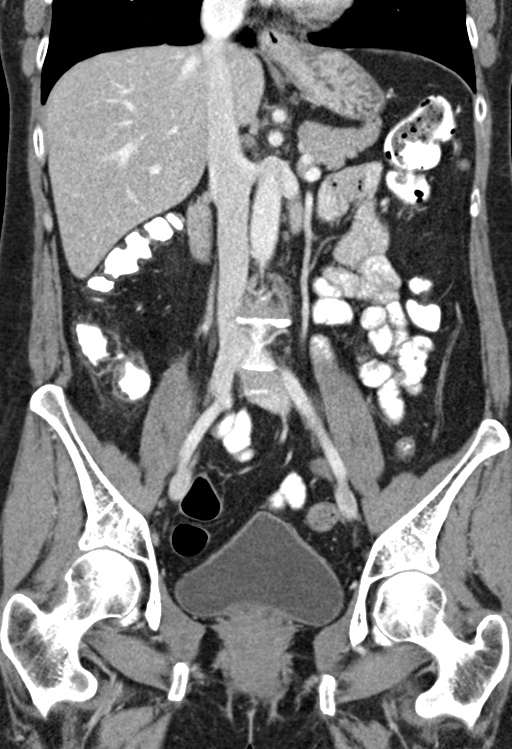

[15 of 46 positions shown; findings below may reference images not displayed]

FINDINGS: Lower chest: No acute abnormality.

Hepatobiliary: Diffuse hypoattenuation of the liver parenchyma
compatible with hepatic steatosis. No focal hepatic abnormality or
biliary dilatation. Gallbladder and biliary system unremarkable.

Pancreas: Unremarkable. No pancreatic ductal dilatation or
surrounding inflammatory changes.

Spleen: Normal in size without focal abnormality.

Adrenals/Urinary Tract: Adrenal glands are unremarkable. Kidneys are
normal, without renal calculi, focal lesion, or hydronephrosis.
Bladder is unremarkable.

Stomach/Bowel: Negative for bowel obstruction, significant
dilatation, ileus, free air. No fluid collection, abscess or
ascites.

In the right lower quadrant, the appendix contains air and contrast
and is normal. However, there is slight thickening over a short
segment of the adjacent terminal ileum, image 40 series 2. There is
also minimal strandy edema along the tip of the cecum in the
pericolonic fat. Differential considerations would be mild terminal
ileitis versus adjacent epiploic appendagitis.

Vascular/Lymphatic: Minor aortic atherosclerosis at the bifurcation.
Negative for aneurysm. No occlusive process. Mesenteric and renal
vasculature remain patent. No Boyadjian disease.

No adenopathy.

Reproductive: Status post hysterectomy. No adnexal masses.

Other: No abdominal wall hernia or abnormality. No abdominopelvic
ascites.

Musculoskeletal: No acute osseous finding
IMPRESSION: Right lower quadrant slight wall thickening/enhancement of the
terminal ileum and adjacent pericecal strandy fat edema. Main
considerations include mild terminal ileitis versus adjacent
pericecal epiploic appendagitis.

Normal appendix

Negative for obstruction, abscess or free air

## 2018-10-21 ENCOUNTER — Ambulatory Visit (INDEPENDENT_AMBULATORY_CARE_PROVIDER_SITE_OTHER): Payer: 59 | Admitting: Primary Care

## 2018-10-21 ENCOUNTER — Encounter: Payer: Self-pay | Admitting: Primary Care

## 2018-10-21 VITALS — BP 118/82 | HR 99 | Temp 98.2°F | Ht 64.0 in | Wt 122.5 lb

## 2018-10-21 DIAGNOSIS — J3489 Other specified disorders of nose and nasal sinuses: Secondary | ICD-10-CM | POA: Diagnosis not present

## 2018-10-21 MED ORDER — MUPIROCIN 2 % EX OINT: 1.0000 "application " | TOPICAL_OINTMENT | Freq: Two times a day (BID) | CUTANEOUS | 0 refills | Status: DC

## 2018-10-21 NOTE — Patient Instructions (Signed)
Apply the mupirocin ointment twice daily to the inside of the nose for 1 week or until gone.  Please message me via my chart if no improvement in 1 week.  It was a pleasure to see you today!

## 2018-10-21 NOTE — Progress Notes (Signed)
Subjective:    Patient ID: Maurie Boettcher, female    DOB: 01/22/1971, 48 y.o.   MRN: [id number]  HPI  Ms. Pinkham is a 48 year old female with a history of asthma who presents today with a chief complaint of skin masses.  She's noticed a reoccurring "bump" to the inside of her left nares that has been intermittent for 2-3 months. The bump is painful. She's tried using saline nasal spray without improvement.   She's also noticed reoccurring bumps to the posterior auricle to the left ear, also bump to top lid of her right eye. The bump to her ear is painful. These bumps have been present for the last several months. She's applied a warm compress to the eye and used "STYE" medication with some improvement.   Review of Systems  Constitutional: Negative for fever.  HENT: Negative for congestion and sinus pressure.   Respiratory: Negative for cough.   Skin: Positive for wound.       Past Medical History:  Diagnosis Date  . Acute right lower quadrant pain 06/10/2018  . Alcohol abuse   . Asthma   . Hypertension   . Insomnia   . Pain of upper abdomen 06/21/2017     Social History   Socioeconomic History  . Marital status: Single    Spouse name: Not on file  . Number of children: Not on file  . Years of education: Not on file  . Highest education level: Not on file  Occupational History  . Not on file  Social Needs  . Financial resource strain: Not on file  . Food insecurity:    Worry: Not on file    Inability: Not on file  . Transportation needs:    Medical: Not on file    Non-medical: Not on file  Tobacco Use  . Smoking status: Never Smoker  . Smokeless tobacco: Never Used  Substance and Sexual Activity  . Alcohol use: Yes    Comment: occasion  . Drug use: No  . Sexual activity: Not on file  Lifestyle  . Physical activity:    Days per week: Not on file    Minutes per session: Not on file  . Stress: Not on file  Relationships  . Social connections:    Talks on  phone: Not on file    Gets together: Not on file    Attends religious service: Not on file    Active member of club or organization: Not on file    Attends meetings of clubs or organizations: Not on file    Relationship status: Not on file  . Intimate partner violence:    Fear of current or ex partner: Not on file    Emotionally abused: Not on file    Physically abused: Not on file    Forced sexual activity: Not on file  Other Topics Concern  . Not on file  Social History Narrative   Single.   2 children, 1 step-son.   Manager at [employer].   Enjoys going to [employer], spending time with family.     Past Surgical History:  Procedure Laterality Date  . CESAREAN SECTION    . FRACTURE SURGERY    . ROBOTIC ASSISTED TOTAL HYSTERECTOMY N/A 03/05/2014   Procedure: ROBOTIC ASSISTED TOTAL HYSTERECTOMY/BILATERAL SALPINGECTOMY;  Surgeon: Serita Kyle, MD;  Location: [facility];  Service: Gynecology;  Laterality: N/A;  . TUBAL LIGATION      Family History  Problem Relation Age of Onset  .  Arthritis Mother   . Arthritis Father   . Lung cancer Father   . Hyperlipidemia Maternal Grandmother   . Stroke Maternal Grandmother   . Hypertension Maternal Grandmother   . Alcohol abuse Paternal Grandmother   . Arthritis Paternal Grandmother   . Mental illness Paternal Grandmother   . Breast cancer Maternal Aunt     Allergies  Allergen Reactions  . Promethazine Hcl Hives and Rash  . Sulfa Antibiotics Hives and Rash    Pt states sent her to the hospital.    Current Outpatient Medications on File Prior to Visit  Medication Sig Dispense Refill  . albuterol (PROVENTIL HFA;VENTOLIN HFA) 108 (90 BASE) MCG/ACT inhaler Inhale 2 puffs into the lungs daily as needed for wheezing or shortness of breath. For shortness of breath    . amLODipine (NORVASC) 10 MG tablet TAKE 1 TABLET BY MOUTH EVERY DAY 90 tablet 1  . cetirizine (ZYRTEC) 10 MG tablet Take 10 mg by mouth daily.    . folic acid (FOLVITE) 1 MG  tablet TAKE ONE TABLET DAILY EXCEPT FOR DAYS TAKING METHOTREXATE  0  . MAGNESIUM PO Take by mouth daily.    . methotrexate (RHEUMATREX) 2.5 MG tablet TAKE FOUR (4) TABLETS EVERY FRIDAY  0  . montelukast (SINGULAIR) 10 MG tablet Take 1 tablet (10 mg total) by mouth at bedtime. For allergies/asthma. 90 tablet 3  . Multiple Vitamins-Minerals (WOMENS ONE DAILY PO) Take by mouth.    Marland Kitchen POTASSIUM PO Take by mouth daily.    Marland Kitchen triamcinolone cream (KENALOG) 0.1 % APPLY TO AFFECTED AREA TWICE A DAY AS NEEDED  1  . zolpidem (AMBIEN) 5 MG tablet TAKE 1 TABLET BY MOUTH EVERY DAY AT BEDTIME AS NEEDED 90 tablet 0   No current facility-administered medications on file prior to visit.     BP 118/82 (BP Location: Left Arm, Patient Position: Sitting, Cuff Size: Normal)   Pulse 99   Temp 98.2 F (36.8 C) (Oral)   Ht 5\' 4"  (1.626 m)   Wt 122 lb 8 oz (55.6 kg)   LMP 03/05/2014   SpO2 98%   BMI 21.03 kg/m    Objective:   Physical Exam  Constitutional: She appears well-nourished.  Cardiovascular: Normal rate.  Respiratory: Effort normal and breath sounds normal.  Skin: There is erythema.  Scabbing with erythema to medial side of left nostril. Small rounded, flesh colored spot to left upper eyelid, no erythema. Slightly raised, very small flesh colored mole appearing mass to left posterior ear.            Assessment & Plan:  Skin Masses:  Nasal irritation present. Treat with mupirocin ointment BID x 1 week. She will discuss other masses with her dermatologist at her upcoming appointment. No evidence of respiratory infection. She will update if symptoms do not improve.  Doreene Nest, NP

## 2018-11-08 DIAGNOSIS — Z5181 Encounter for therapeutic drug level monitoring: Secondary | ICD-10-CM | POA: Diagnosis not present

## 2018-11-08 DIAGNOSIS — L309 Dermatitis, unspecified: Secondary | ICD-10-CM | POA: Diagnosis not present

## 2018-11-09 DIAGNOSIS — H1045 Other chronic allergic conjunctivitis: Secondary | ICD-10-CM | POA: Diagnosis not present

## 2018-11-09 DIAGNOSIS — H00014 Hordeolum externum left upper eyelid: Secondary | ICD-10-CM | POA: Diagnosis not present

## 2018-11-15 ENCOUNTER — Encounter: Payer: Self-pay | Admitting: Primary Care

## 2018-11-15 ENCOUNTER — Ambulatory Visit (INDEPENDENT_AMBULATORY_CARE_PROVIDER_SITE_OTHER): Payer: 59 | Admitting: Primary Care

## 2018-11-15 VITALS — BP 124/84 | HR 76 | Temp 98.0°F | Ht 64.0 in | Wt 119.5 lb

## 2018-11-15 DIAGNOSIS — R945 Abnormal results of liver function studies: Secondary | ICD-10-CM | POA: Diagnosis not present

## 2018-11-15 DIAGNOSIS — R7989 Other specified abnormal findings of blood chemistry: Secondary | ICD-10-CM

## 2018-11-15 DIAGNOSIS — L308 Other specified dermatitis: Secondary | ICD-10-CM | POA: Diagnosis not present

## 2018-11-15 DIAGNOSIS — R197 Diarrhea, unspecified: Secondary | ICD-10-CM | POA: Diagnosis not present

## 2018-11-15 LAB — CBC
HEMATOCRIT: 38.9 % (ref 36.0–46.0)
Hemoglobin: 13.3 g/dL (ref 12.0–15.0)
MCHC: 34.2 g/dL (ref 30.0–36.0)
MCV: 92.1 fl (ref 78.0–100.0)
Platelets: 231 10*3/uL (ref 150.0–400.0)
RBC: 4.22 Mil/uL (ref 3.87–5.11)
RDW: 14.8 % (ref 11.5–15.5)
WBC: 4.5 10*3/uL (ref 4.0–10.5)

## 2018-11-15 LAB — TSH: TSH: 1.13 u[IU]/mL (ref 0.35–4.50)

## 2018-11-15 NOTE — Assessment & Plan Note (Signed)
Overall slight elevation but evident since June 2019 when beginning methotrexate.  Highly suspect methotrexate to be the cause given her normal body habitus with BMI of 20.  She endorses a healthy diet and is active.  If liver enzymes do not decrease with removal of methotrexate then consider abdominal ultrasound for further evaluation.  Check TSH and CBC today.

## 2018-11-15 NOTE — Assessment & Plan Note (Signed)
Following with dermatology and doing well methotrexate.  Suspect methotrexate to be causing her wide variety of symptoms.  Also suspect methotrexate to be causing elevated liver enzymes.  We will check TSH and CBC today to rule out other causes for symptoms, but I encouraged her to discuss symptoms and the potential to come off of methotrexate in greater detail with her dermatologist in 2 weeks.

## 2018-11-15 NOTE — Progress Notes (Signed)
Subjective:    Patient ID: Kelly Walton, female    DOB: 11/10/1970, 48 y.o.   MRN: [id number]  HPI  Ms. Fabris is a 48 year old female with a history of hypertension, asthma, hyperlipidemia (not managed on statin therapy) who presents today to discuss elevated liver enzymes.   She is currently following with dermatology. Diagnosed with spongiotic dermatitis in June 2019. She was prescribed methotrexate 10 mg once weekly at the time and has since noticed improvement. She then started reducing the dose of her medication to every other week and is currently taking this way.  Her last dose was Friday, November 04, 2018.  We have records of her labs from dermatology: Liver enzymes in March 2020 with AST of 63 and ALT of 51. Liver enzymes in December 2019 with AST of 47 and ALT of 37.  Liver enzymes in September 2019 with AST of 44 and ALT of 32.  She has noticed improvement in dermatitis and itching with methotrexate, but has noticed skin itching with consumption of dairy products. Over the last one month she's noticed symptoms including left lower back, left lower rib pain, intermittent diarrhea (one episode every 2-3 days), and bilateral lower extremity swelling. She's vomited three times over the last several months, typically 2 days after taking methotrexate. 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 She denies bloody stools, fevers, abdominal pain.  She has an appointment with her dermatologist in 2 weeks.  Review of Systems  Constitutional: Negative for chills and fever.  Respiratory: Negative for shortness of breath.   Cardiovascular: Positive for leg swelling. Negative for chest pain.  Gastrointestinal: Positive for diarrhea, nausea and vomiting. Negative for abdominal pain and blood in stool.  Musculoskeletal: Positive for arthralgias.  Psychiatric/Behavioral: The patient is not nervous/anxious.        Past Medical History:  Diagnosis Date  .  Acute right lower quadrant pain 06/10/2018  . Alcohol abuse   . Asthma   . Hypertension   . Insomnia   . Pain of upper abdomen 06/21/2017  . Spongiotic dermatitis      Social History   Socioeconomic History  . Marital status: Single    Spouse name: Not on file  . Number of children: Not on file  . Years of education: Not on file  . Highest education level: Not on file  Occupational History  . Not on file  Social Needs  . Financial resource strain: Not on file  . Food insecurity:    Worry: Not on file    Inability: Not on file  . Transportation needs:    Medical: Not on file    Non-medical: Not on file  Tobacco Use  . Smoking status: Never Smoker  . Smokeless tobacco: Never Used  Substance and Sexual Activity  . Alcohol use: Yes    Comment: occasion  . Drug use: No  . Sexual activity: Not on file  Lifestyle  . Physical activity:    Days per week: Not on file    Minutes per session: Not on file  . Stress: Not on file  Relationships  . Social connections:    Talks on phone: Not on file    Gets together: Not on file    Attends religious service: Not on file    Active member of club or organization: Not on file    Attends meetings of clubs or organizations: Not on file  Relationship status: Not on file  . Intimate partner violence:    Fear of current or ex partner: Not on file    Emotionally abused: Not on file    Physically abused: Not on file    Forced sexual activity: Not on file  Other Topics Concern  . Not on file  Social History Narrative   Single.   2 children, 1 step-son.   Manager at [employer].   Enjoys going to [employer], spending time with family.     Past Surgical History:  Procedure Laterality Date  . CESAREAN SECTION    . FRACTURE SURGERY    . ROBOTIC ASSISTED TOTAL HYSTERECTOMY N/A 03/05/2014   Procedure: ROBOTIC ASSISTED TOTAL HYSTERECTOMY/BILATERAL SALPINGECTOMY;  Surgeon: Serita Kyle, MD;  Location: [facility];  Service: Gynecology;   Laterality: N/A;  . TUBAL LIGATION      Family History  Problem Relation Age of Onset  . Arthritis Mother   . Arthritis Father   . Lung cancer Father   . Hyperlipidemia Maternal Grandmother   . Stroke Maternal Grandmother   . Hypertension Maternal Grandmother   . Alcohol abuse Paternal Grandmother   . Arthritis Paternal Grandmother   . Mental illness Paternal Grandmother   . Breast cancer Maternal Aunt     Allergies  Allergen Reactions  . Promethazine Hcl Hives and Rash  . Sulfa Antibiotics Hives and Rash    Pt states sent her to the hospital.    Current Outpatient Medications on File Prior to Visit  Medication Sig Dispense Refill  . albuterol (PROVENTIL HFA;VENTOLIN HFA) 108 (90 BASE) MCG/ACT inhaler Inhale 2 puffs into the lungs daily as needed for wheezing or shortness of breath. For shortness of breath    . amLODipine (NORVASC) 10 MG tablet TAKE 1 TABLET BY MOUTH EVERY DAY 90 tablet 1  . cetirizine (ZYRTEC) 10 MG tablet Take 10 mg by mouth daily.    . folic acid (FOLVITE) 1 MG tablet TAKE ONE TABLET DAILY EXCEPT FOR DAYS TAKING METHOTREXATE  0  . MAGNESIUM PO Take by mouth daily.    . montelukast (SINGULAIR) 10 MG tablet Take 1 tablet (10 mg total) by mouth at bedtime. For allergies/asthma. 90 tablet 3  . Multiple Vitamins-Minerals (WOMENS ONE DAILY PO) Take by mouth.    . mupirocin ointment (BACTROBAN) 2 % Place 1 application into the nose 2 (two) times daily. 22 g 0  . POTASSIUM PO Take by mouth daily.    Marland Kitchen triamcinolone cream (KENALOG) 0.1 % APPLY TO AFFECTED AREA TWICE A DAY AS NEEDED  1  . zolpidem (AMBIEN) 5 MG tablet TAKE 1 TABLET BY MOUTH EVERY DAY AT BEDTIME AS NEEDED 90 tablet 0  . methotrexate (RHEUMATREX) 2.5 MG tablet TAKE FOUR (4) TABLETS EVERY FRIDAY  0   No current facility-administered medications on file prior to visit.     BP 124/84   Pulse 76   Temp 98 F (36.7 C) (Oral)   Ht 5\' 4"  (1.626 m)   Wt 119 lb 8 oz (54.2 kg)   LMP 03/05/2014   SpO2  98%   BMI 20.51 kg/m    Objective:   Physical Exam  Constitutional: She appears well-nourished.  Neck: Neck supple.  Cardiovascular: Normal rate and regular rhythm.  Respiratory: Effort normal and breath sounds normal.  GI: Soft. Bowel sounds are normal. There is no abdominal tenderness.  Skin: Skin is warm and dry.  Psychiatric: She has a normal mood and affect.  Assessment & Plan:

## 2018-11-15 NOTE — Patient Instructions (Signed)
Stop by the lab prior to leaving today. I will notify you of your results once received.   Follow up with dermatology for further evaluation of your symptoms.  It was a pleasure to see you today!

## 2018-11-25 ENCOUNTER — Ambulatory Visit (INDEPENDENT_AMBULATORY_CARE_PROVIDER_SITE_OTHER): Payer: 59 | Admitting: Orthopaedic Surgery

## 2018-11-25 ENCOUNTER — Other Ambulatory Visit: Payer: Self-pay

## 2018-11-25 ENCOUNTER — Encounter (INDEPENDENT_AMBULATORY_CARE_PROVIDER_SITE_OTHER): Payer: Self-pay | Admitting: Orthopaedic Surgery

## 2018-11-25 VITALS — BP 133/91 | HR 84 | Resp 14 | Ht 65.0 in | Wt 116.0 lb

## 2018-11-25 DIAGNOSIS — M79672 Pain in left foot: Secondary | ICD-10-CM

## 2018-11-25 DIAGNOSIS — M79671 Pain in right foot: Secondary | ICD-10-CM

## 2018-11-25 MED ORDER — DICLOFENAC SODIUM 1 % TD GEL: TRANSDERMAL | 3 refills | Status: DC

## 2018-11-25 NOTE — Progress Notes (Signed)
Office Visit Note   Patient: Kelly Walton           Date of Birth: 09/30/1970           MRN: [id number] Visit Date: 11/25/2018              Requested by: Doreene Nest, NP [address] [place], [place] [address] PCP: Doreene Nest, NP   Assessment & Plan: Visit Diagnoses:  1. Pain in both feet     Plan: Bilateral painful bunionettes.  Long discussion regarding the diagnosis and treatment options.  She wears good comfortable shoes with arch supports.  Will apply small skin and try Voltaren gel.  Follow-Up Instructions: Return if symptoms worsen or fail to improve.   Orders:  No orders of the defined types were placed in this encounter.  Meds ordered this encounter  Medications  . diclofenac sodium (VOLTAREN) 1 % GEL    Sig: 3 grams to 3 large joints up to 3 times daily    Dispense:  3 Tube    Refill:  3      Procedures: No procedures performed   Clinical Data: No additional findings.   Subjective: Chief Complaint  Patient presents with  . Right Foot - Pain  . Left Foot - Pain  . Foot Pain    Bil foot pain   Kelly Walton is a 48 year old female who presents with bilateral foot pain. She has had pain in her right foot x 2 months, left foot pain x 4 days. She has not experienced any injury. She has had right foot surgery fx - Dr. Cleophas Dunker x years. She has not had surgery to left foot, she is not diabetic. She is experiencing difficulty walking, bearing weight, swelling, no difficulty sleeping at night. She is elevating her feet and using Tylenol/Aleve which is not helping. She was seen in August, 2019 for right foot pain. X-rays were performed on both feet at that time.  Films of the left foot were obtained in August 2019 without any abnormalities about the little toe  HPI  Review of Systems  Constitutional: Negative for fatigue.  HENT: Negative for trouble swallowing.   Eyes: Negative for pain.  Respiratory: Negative for shortness of breath.    Cardiovascular: Positive for leg swelling.  Gastrointestinal: Negative for constipation.  Endocrine: Negative for cold intolerance.  Genitourinary: Negative for difficulty urinating.  Musculoskeletal: Negative for gait problem.  Skin: Negative for rash.  Allergic/Immunologic: Positive for food allergies.  Neurological: Negative for weakness.  Hematological: Does not bruise/bleed easily.  Psychiatric/Behavioral: Negative for sleep disturbance.     Objective: Vital Signs: BP (!) 133/91 (BP Location: Left Arm, Patient Position: Sitting, Cuff Size: Normal)   Pulse 84   Resp 14   Ht 5\' 5"  (1.651 m)   Wt 116 lb (52.6 kg)   LMP 03/05/2014   BMI 19.30 kg/m   Physical Exam Constitutional:      Appearance: She is well-developed.  Eyes:     Pupils: Pupils are equal, round, and reactive to light.  Pulmonary:     Effort: Pulmonary effort is normal.  Skin:    General: Skin is warm and dry.  Neurological:     Mental Status: She is alert and oriented to person, place, and time.  Psychiatric:        Behavior: Behavior normal.     Ortho Exam awake alert and oriented x3.  Comfortable sitting.  Wearing sandals.  Has  prominent bunionette of the right foot and less prominence on the bunionette of the left foot.  No toe deformities.  Has pain out of proportion of what I would expect just touching the area over the bunion and no fluctuance.  No evidence of infection.  Good capillary refill to toes.  Specialty Comments:  No specialty comments available.  Imaging: No results found.   PMFS History: Patient Active Problem List   Diagnosis Date Noted  . Elevated LFTs 11/15/2018  . 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 Hyperlipidemia 07/08/2018  . Asthma 08/20/2017  . Preventative health care 06/21/2017  . Insomnia 04/16/2017  . Foot pain 04/16/2017  . Essential hypertension 04/16/2017   Past Medical History:  Diagnosis Date  . Acute right lower quadrant pain 06/10/2018  . Alcohol  abuse   . Asthma   . Hypertension   . Insomnia   . Pain of upper abdomen 06/21/2017  . Spongiotic dermatitis     Family History  Problem Relation Age of Onset  . Arthritis Mother   . Arthritis Father   . Lung cancer Father   . Hyperlipidemia Maternal Grandmother   . Stroke Maternal Grandmother   . Hypertension Maternal Grandmother   . Alcohol abuse Paternal Grandmother   . Arthritis Paternal Grandmother   . Mental illness Paternal Grandmother   . Breast cancer Maternal Aunt     Past Surgical History:  Procedure Laterality Date  . CESAREAN SECTION    . FRACTURE SURGERY    . ROBOTIC ASSISTED TOTAL HYSTERECTOMY N/A 03/05/2014   Procedure: ROBOTIC ASSISTED TOTAL HYSTERECTOMY/BILATERAL SALPINGECTOMY;  Surgeon: Serita Kyle, MD;  Location: [facility];  Service: Gynecology;  Laterality: N/A;  . TUBAL LIGATION     Social History   Occupational History  . Not on file  Tobacco Use  . Smoking status: Never Smoker  . Smokeless tobacco: Never Used  Substance and Sexual Activity  . Alcohol use: Yes    Comment: occasion  . Drug use: No  . Sexual activity: Not on file

## 2018-12-15 DIAGNOSIS — G4700 Insomnia, unspecified: Secondary | ICD-10-CM

## 2018-12-15 MED ORDER — ZOLPIDEM TARTRATE 5 MG PO TABS: 5.0000 mg | ORAL_TABLET | Freq: Every evening | ORAL | 0 refills | Status: DC | PRN

## 2018-12-15 NOTE — Telephone Encounter (Signed)
Last office visit 11/15/2018 for Elevated LFTs.  Last refilled 09/26/2018 for #90 with no refills.  No future appointments.  Ok to refill?

## 2018-12-23 ENCOUNTER — Other Ambulatory Visit: Payer: Self-pay | Admitting: Primary Care

## 2018-12-23 DIAGNOSIS — I10 Essential (primary) hypertension: Secondary | ICD-10-CM

## 2019-01-16 ENCOUNTER — Ambulatory Visit (INDEPENDENT_AMBULATORY_CARE_PROVIDER_SITE_OTHER): Payer: 59 | Admitting: Primary Care

## 2019-01-16 DIAGNOSIS — J4520 Mild intermittent asthma, uncomplicated: Secondary | ICD-10-CM

## 2019-01-16 DIAGNOSIS — J302 Other seasonal allergic rhinitis: Secondary | ICD-10-CM | POA: Insufficient documentation

## 2019-01-16 MED ORDER — PREDNISONE 20 MG PO TABS: ORAL_TABLET | ORAL | 0 refills | Status: DC

## 2019-01-16 NOTE — Assessment & Plan Note (Signed)
Symptoms suggestive of allergies that have likely aggravated asthma. Will treat with prednisone burst and PRN continuation of her albuterol inhaler. She will update in 4-5 days. If no improvement then consider ICS inhaler. Continue Zyrtec.

## 2019-01-16 NOTE — Progress Notes (Signed)
Subjective:    Patient ID: Kelly Walton, female    DOB: 1970-11-09, 48 y.o.   MRN: [id number]  HPI  Virtual Visit via Video Note  I connected with Kelly Walton on 01/16/19 at  3:20 PM EDT by a video enabled telemedicine application and verified that I am speaking with the correct person using two identifiers.  Location: Patient: Home Provider: Office   I discussed the limitations of evaluation and management by telemedicine and the availability of in person appointments. The patient expressed understanding and agreed to proceed.  History of Present Illness:  Ms. Wassmer is a 48 year old female with a history of asthma who presents today with a chief complaint of cough.  She also reports sneezing, shortness of breath. Her cough is dry. She's taking her Zyrtec daily. She's using her albuterol inhaler twice daily for the last 2 weeks, prior to then she hardly used it at all. She does notice temporary improvement after using her albuterol inhaler. She works at [employer] and is around a lot of people, she has been wearing a mask daily at work for numerous weeks. She denies fevers, fatigue, chills.    Observations/Objective:  Alert and oriented. Appears well, not sickly. No distress. Speaking in complete sentences.  Dry cough infrequently during exam.  Assessment and Plan:  See problem based charting.  Follow Up Instructions:  Stat prednisone 20 mg tablets. Take 2 tablets daily for 5 days.  Use the albuterol inhaler every 4-6 hours as needed.  Please notify me if no improvement in 4-5 days, or if your breathing gets worse.  It was a pleasure to see you today! [name], NP-C     I discussed the assessment and treatment plan with the patient. The patient was provided an opportunity to ask questions and all were answered. The patient agreed with the plan and demonstrated an understanding of the instructions.   The patient was advised to call back or seek an in-person  evaluation if the symptoms worsen or if the condition fails to improve as anticipated.     Doreene Nest, NP    Review of Systems  Constitutional: Negative for chills, fatigue and fever.  HENT: Positive for postnasal drip. Negative for congestion and sore throat.   Respiratory: Positive for cough and shortness of breath. Negative for wheezing.   Allergic/Immunologic: Positive for environmental allergies.       Past Medical History:  Diagnosis Date  . Acute right lower quadrant pain 06/10/2018  . Alcohol abuse   . Asthma   . Hypertension   . Insomnia   . Pain of upper abdomen 06/21/2017  . Spongiotic dermatitis      Social History   Socioeconomic History  . Marital status: Single    Spouse name: Not on file  . Number of children: Not on file  . Years of education: Not on file  . Highest education level: Not on file  Occupational History  . Not on file  Social Needs  . Financial resource strain: Not on file  . Food insecurity:    Worry: Not on file    Inability: Not on file  . Transportation needs:    Medical: Not on file    Non-medical: Not on file  Tobacco Use  . Smoking status: Never Smoker  . Smokeless tobacco: Never Used  Substance and Sexual Activity  . Alcohol use: Yes    Comment: occasion  . Drug use: No  . Sexual activity:  Not on file  Lifestyle  . Physical activity:    Days per week: Not on file    Minutes per session: Not on file  . Stress: Not on file  Relationships  . Social connections:    Talks on phone: Not on file    Gets together: Not on file    Attends religious service: Not on file    Active member of club or organization: Not on file    Attends meetings of clubs or organizations: Not on file    Relationship status: Not on file  . Intimate partner violence:    Fear of current or ex partner: Not on file    Emotionally abused: Not on file    Physically abused: Not on file    Forced sexual activity: Not on file  Other Topics  Concern  . Not on file  Social History Narrative   Single.   2 children, 1 step-son.   Manager at [employer].   Enjoys going to [place], spending time with family.     Past Surgical History:  Procedure Laterality Date  . CESAREAN SECTION    . FRACTURE SURGERY    . ROBOTIC ASSISTED TOTAL HYSTERECTOMY N/A 03/05/2014   Procedure: ROBOTIC ASSISTED TOTAL HYSTERECTOMY/BILATERAL SALPINGECTOMY;  Surgeon: Serita Kyle, MD;  Location: [facility];  Service: Gynecology;  Laterality: N/A;  . TUBAL LIGATION      Family History  Problem Relation Age of Onset  . Arthritis Mother   . Arthritis Father   . Lung cancer Father   . Hyperlipidemia Maternal Grandmother   . Stroke Maternal Grandmother   . Hypertension Maternal Grandmother   . Alcohol abuse Paternal Grandmother   . Arthritis Paternal Grandmother   . Mental illness Paternal Grandmother   . Breast cancer Maternal Aunt     Allergies  Allergen Reactions  . Promethazine Hcl Hives and Rash  . Sulfa Antibiotics Hives and Rash    Pt states sent her to the hospital.    Current Outpatient Medications on File Prior to Visit  Medication Sig Dispense Refill  . albuterol (PROVENTIL HFA;VENTOLIN HFA) 108 (90 BASE) MCG/ACT inhaler Inhale 2 puffs into the lungs daily as needed for wheezing or shortness of breath. For shortness of breath    . amLODipine (NORVASC) 10 MG tablet TAKE 1 TABLET BY MOUTH EVERY DAY 90 tablet 1  . cetirizine (ZYRTEC) 10 MG tablet Take 10 mg by mouth daily.    . diclofenac sodium (VOLTAREN) 1 % GEL 3 grams to 3 large joints up to 3 times daily 3 Tube 3  . folic acid (FOLVITE) 1 MG tablet TAKE ONE TABLET DAILY EXCEPT FOR DAYS TAKING METHOTREXATE  0  . MAGNESIUM PO Take by mouth daily.    . methotrexate (RHEUMATREX) 2.5 MG tablet TAKE FOUR (4) TABLETS EVERY FRIDAY  0  . montelukast (SINGULAIR) 10 MG tablet Take 1 tablet (10 mg total) by mouth at bedtime. For allergies/asthma. 90 tablet 3  . Multiple Vitamins-Minerals (WOMENS  ONE DAILY PO) Take by mouth.    . mupirocin ointment (BACTROBAN) 2 % Place 1 application into the nose 2 (two) times daily. 22 g 0  . olopatadine (PATANOL) 0.1 % ophthalmic solution as needed.    Marland Kitchen POTASSIUM PO Take by mouth daily.    Marland Kitchen triamcinolone cream (KENALOG) 0.1 % APPLY TO AFFECTED AREA TWICE A DAY AS NEEDED  1  . zolpidem (AMBIEN) 5 MG tablet Take 1 tablet (5 mg total) by mouth at bedtime  as needed for sleep. 90 tablet 0   No current facility-administered medications on file prior to visit.     LMP 03/05/2014    Objective:   Physical Exam  Constitutional: She is oriented to person, place, and time. She appears well-nourished.  Respiratory: Effort normal. No respiratory distress.  Dry cough infrequently during exam  Neurological: She is alert and oriented to person, place, and time.  Psychiatric: She has a normal mood and affect.           Assessment & Plan:

## 2019-01-16 NOTE — Patient Instructions (Signed)
Stat prednisone 20 mg tablets. Take 2 tablets daily for 5 days.  Use the albuterol inhaler every 4-6 hours as needed.  Please notify me if no improvement in 4-5 days, or if your breathing gets worse.  It was a pleasure to see you today! [name], NP-C

## 2019-01-16 NOTE — Assessment & Plan Note (Signed)
Symptoms suggestive of seasonal allergies that have aggravated asthma. Continue Zyrtec. Treat acute asthma exacerbation. She will update.

## 2019-02-16 DIAGNOSIS — J4520 Mild intermittent asthma, uncomplicated: Secondary | ICD-10-CM

## 2019-02-16 MED ORDER — ALBUTEROL SULFATE HFA 108 (90 BASE) MCG/ACT IN AERS: 2.0000 | INHALATION_SPRAY | RESPIRATORY_TRACT | 0 refills | Status: DC | PRN

## 2019-03-06 ENCOUNTER — Other Ambulatory Visit: Payer: Self-pay | Admitting: Primary Care

## 2019-03-06 DIAGNOSIS — J4520 Mild intermittent asthma, uncomplicated: Secondary | ICD-10-CM

## 2019-03-18 ENCOUNTER — Other Ambulatory Visit: Payer: Self-pay | Admitting: Primary Care

## 2019-03-18 DIAGNOSIS — G4700 Insomnia, unspecified: Secondary | ICD-10-CM

## 2019-03-20 MED ORDER — ZOLPIDEM TARTRATE 5 MG PO TABS: 5.0000 mg | ORAL_TABLET | Freq: Every evening | ORAL | 0 refills | Status: DC | PRN

## 2019-03-23 NOTE — Telephone Encounter (Signed)
Jillian with [facility] called to check status of PA. She stated that the patient is out of [facility] and they were hoping to speak to someone to check the status.   [facility] dept Phone # [phone]  They are needing a PA for the Quantity of medication in order for her insurance to cover the 90 day supply

## 2019-03-24 NOTE — Telephone Encounter (Signed)
Sent the PA to [employer] and faxed the questionnaire as well.

## 2019-04-03 ENCOUNTER — Ambulatory Visit (INDEPENDENT_AMBULATORY_CARE_PROVIDER_SITE_OTHER): Payer: 59 | Admitting: Primary Care

## 2019-04-03 ENCOUNTER — Encounter: Payer: Self-pay | Admitting: Primary Care

## 2019-04-03 VITALS — Wt 119.0 lb

## 2019-04-03 DIAGNOSIS — I10 Essential (primary) hypertension: Secondary | ICD-10-CM | POA: Diagnosis not present

## 2019-04-03 DIAGNOSIS — R002 Palpitations: Secondary | ICD-10-CM | POA: Insufficient documentation

## 2019-04-03 NOTE — Assessment & Plan Note (Signed)
Patient endorses "normal" readings at work, cannot recall BP levels. Will have her monitor.

## 2019-04-03 NOTE — Patient Instructions (Signed)
You will be contacted regarding your referral to cardiology.  Please let us know if you have not been contacted within one week.   It was a pleasure to see you today! Allie Bossier, NP-C

## 2019-04-03 NOTE — Progress Notes (Signed)
Subjective:    Patient ID: Kelly Boettcherammy A Blasingame, female    DOB: Jul 01, 1971, 48 y.o.   MRN: [id number]  HPI  Virtual Visit via Video Note  I connected with Kelly Walton on 04/03/19 at 10:40 AM EDT by a video enabled telemedicine application and verified that I am speaking with the correct person using two identifiers.  Location: Patient: Home Provider: Office   I discussed the limitations of evaluation and management by telemedicine and the availability of in person appointments. The patient expressed understanding and agreed to proceed.  History of Present Illness:  Kelly Walton is a 48 year old female with a history of hypertension, asthma who presents today to discuss referral to cardiology.  She is wanting a cardiac evaluation as she's getting older. She has also noticed palpitations since Covid and since having to wear a mask for her 8 hour shift at work. Palpitations occur with both rest and exertion.   She's been checking her BP and HR at work. BP is running "normal" and doesn't remember readings. HR is ranging 110-115 resting. She denies consumption of energy drinks, excessive caffeine intake. She does endorse intermittent anxiety as her family has tested positive for Covid, also having some intermittent shortness of breath with wearing masks, stress at work.   Her mother has been seeing a cardiologist recently, she's unsure of why. No other known heart disease.   BP Readings from Last 3 Encounters:  11/25/18 (!) 133/91  11/15/18 124/84  10/21/18 118/82      Observations/Objective:  Alert and oriented. Appears well, not sickly. No distress. Speaking in complete sentences.   Assessment and Plan:  See problem based charting.    Follow Up Instructions:  You will be contacted regarding your referral to cardiology.  Please let us know if you have not been contacted within one week.   It was a pleasure to see you today! Mayra ReelKate Isaiah Torok, NP-C    I discussed the assessment  and treatment plan with the patient. The patient was provided an opportunity to ask questions and all were answered. The patient agreed with the plan and demonstrated an understanding of the instructions.   The patient was advised to call back or seek an in-person evaluation if the symptoms worsen or if the condition fails to improve as anticipated.     Doreene NestKatherine K Jassiel Flye, NP    Review of Systems  Constitutional: Negative for fatigue and fever.  Eyes: Negative for visual disturbance.  Respiratory: Negative for cough and shortness of breath.   Cardiovascular: Positive for palpitations.  Neurological: Negative for dizziness and headaches.       Past Medical History:  Diagnosis Date  . Acute right lower quadrant pain 06/10/2018  . Alcohol abuse   . Asthma   . Hypertension   . Insomnia   . Pain of upper abdomen 06/21/2017  . Spongiotic dermatitis      Social History   Socioeconomic History  . Marital status: Single    Spouse name: Not on file  . Number of children: Not on file  . Years of education: Not on file  . Highest education level: Not on file  Occupational History  . Not on file  Social Needs  . Financial resource strain: Not on file  . Food insecurity    Worry: Not on file    Inability: Not on file  . Transportation needs    Medical: Not on file    Non-medical: Not on file  Tobacco  Use  . Smoking status: Never Smoker  . Smokeless tobacco: Never Used  Substance and Sexual Activity  . Alcohol use: Yes    Comment: occasion  . Drug use: No  . Sexual activity: Not on file  Lifestyle  . Physical activity    Days per week: Not on file    Minutes per session: Not on file  . Stress: Not on file  Relationships  . Social Musicianconnections    Talks on phone: Not on file    Gets together: Not on file    Attends religious service: Not on file    Active member of club or organization: Not on file    Attends meetings of clubs or organizations: Not on file     Relationship status: Not on file  . Intimate partner violence    Fear of current or ex partner: Not on file    Emotionally abused: Not on file    Physically abused: Not on file    Forced sexual activity: Not on file  Other Topics Concern  . Not on file  Social History Narrative   Single.   2 children, 1 step-son.   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     Past Surgical History:  Procedure Laterality Date  . CESAREAN SECTION    . FRACTURE SURGERY    . ROBOTIC ASSISTED TOTAL HYSTERECTOMY N/A 03/05/2014   Procedure: ROBOTIC ASSISTED TOTAL HYSTERECTOMY/BILATERAL SALPINGECTOMY;  Surgeon: Serita KyleSheronette A Cousins, MD;  Location: [facility];  Service: Gynecology;  Laterality: N/A;  . TUBAL LIGATION      Family History  Problem Relation Age of Onset  . Arthritis Mother   . Arthritis Father   . Lung cancer Father   . Hyperlipidemia Maternal Grandmother   . Stroke Maternal Grandmother   . Hypertension Maternal Grandmother   . Alcohol abuse Paternal Grandmother   . Arthritis Paternal Grandmother   . Mental illness Paternal Grandmother   . Breast cancer Maternal Aunt   . Epilepsy Brother     Allergies  Allergen Reactions  . Promethazine Hcl Hives and Rash  . Sulfa Antibiotics Hives and Rash    Pt states sent her to the hospital.    Current Outpatient Medications on File Prior to Visit  Medication Sig Dispense Refill  . albuterol (VENTOLIN HFA) 108 (90 Base) MCG/ACT inhaler Inhale 2 puffs into the lungs every 4 (four) hours as needed for wheezing or shortness of breath. 1 Inhaler 0  . amLODipine (NORVASC) 10 MG tablet TAKE 1 TABLET BY MOUTH EVERY DAY 90 tablet 1  . cetirizine (ZYRTEC) 10 MG tablet Take 10 mg by mouth daily.    . diclofenac sodium (VOLTAREN) 1 % GEL 3 grams to 3 large joints up to 3 times daily 3 Tube 3  . folic acid (FOLVITE) 1 MG tablet TAKE ONE TABLET DAILY EXCEPT FOR DAYS TAKING METHOTREXATE  0  . MAGNESIUM PO Take by mouth daily.     . methotrexate (RHEUMATREX) 2.5 MG tablet TAKE FOUR (4) TABLETS EVERY FRIDAY  0  . montelukast (SINGULAIR) 10 MG tablet Take 1 tablet (10 mg total) by mouth at bedtime. For allergies/asthma. 90 tablet 3  . Multiple Vitamins-Minerals (WOMENS ONE DAILY PO) Take by mouth.    . mupirocin ointment (BACTROBAN) 2 % Place 1 application into the nose 2 (two) times daily. 22 g 0  . olopatadine (PATANOL) 0.1 % ophthalmic solution as needed.    Marland Kitchen. POTASSIUM PO  Take by mouth daily.    . predniSONE (DELTASONE) 20 MG tablet Take 2 tablets by mouth once daily for 5 days. 10 tablet 0  . triamcinolone cream (KENALOG) 0.1 % APPLY TO AFFECTED AREA TWICE A DAY AS NEEDED  1  . zolpidem (AMBIEN) 5 MG tablet Take 1 tablet (5 mg total) by mouth at bedtime as needed for sleep. 90 tablet 0   No current facility-administered medications on file prior to visit.     Wt 119 lb (54 kg)   LMP 03/05/2014   BMI 19.80 kg/m    Objective:   Physical Exam  Constitutional: She is oriented to person, place, and time. She appears well-nourished.  Respiratory: Effort normal.  Neurological: She is alert and oriented to person, place, and time.  Psychiatric: She has a normal mood and affect.           Assessment & Plan:

## 2019-04-03 NOTE — Assessment & Plan Note (Signed)
Intermittent since Covid-19 in March 2020. Suspect some aspect could be secondary to some anxiety. Labs from March 2020 without obvious cause. Will refer to cardiology for further evaluation. She does not wish to come into the office at this point for ECG.  Referral placed.

## 2019-04-06 ENCOUNTER — Telehealth: Payer: Self-pay

## 2019-04-06 NOTE — Telephone Encounter (Signed)
Kelly Walton with [facility] [employer] called to do PA for Kelly Walton over phone. Vallarie Mare CMA was tied up but received cover my med and she will work on that. Kelly Walton said OK and if need to call PA dept at [place] call [phone].

## 2019-04-07 NOTE — Telephone Encounter (Signed)
PA has been completed

## 2019-04-25 ENCOUNTER — Other Ambulatory Visit: Payer: Self-pay

## 2019-04-25 ENCOUNTER — Ambulatory Visit (INDEPENDENT_AMBULATORY_CARE_PROVIDER_SITE_OTHER): Payer: 59 | Admitting: Family Medicine

## 2019-04-25 ENCOUNTER — Ambulatory Visit: Payer: Self-pay

## 2019-04-25 ENCOUNTER — Encounter: Payer: Self-pay | Admitting: Family Medicine

## 2019-04-25 DIAGNOSIS — M5441 Lumbago with sciatica, right side: Secondary | ICD-10-CM

## 2019-04-25 DIAGNOSIS — M5442 Lumbago with sciatica, left side: Secondary | ICD-10-CM | POA: Diagnosis not present

## 2019-04-25 MED ORDER — NABUMETONE 750 MG PO TABS: 750.0000 mg | ORAL_TABLET | Freq: Two times a day (BID) | ORAL | 6 refills | Status: DC | PRN

## 2019-04-25 MED ORDER — TIZANIDINE HCL 2 MG PO TABS: 2.0000 mg | ORAL_TABLET | Freq: Four times a day (QID) | ORAL | 1 refills | Status: DC | PRN

## 2019-04-25 NOTE — Progress Notes (Signed)
Kelly Walton - 48 y.o. female MRN [id number]  Date of birth: 1971/03/30  Office Visit Note: Visit Date: 04/25/2019 PCP: Doreene Nestlark, Katherine K, NP Referred by: Doreene Nestlark, Katherine K, NP  Subjective: Chief Complaint  Patient presents with  . Lower Back - Pain    Pain in the lower back since falling in her bathroom 1&1/2 weeks ago - her animals were chasing each other at her feet. Pain is more on left side, but is starting to spread to the hips. Has had intermittent numbness/tingling in the legs, mainly w/WB.   HPI: Kelly Walton is a 48 y.o. female who comes in today with acute back pain. Reports that 1.5 weeks ago, she was tripped by her dog and cat in the bathroom and landed on her back, hit her head. She had concussive symptoms of vomiting, headache but these have both improved. Reports pain in lower and mid back with left side worse than right. Improves when she alternates ice/heat and is able to lie down. Has been taking tylenol, ibuprofen with minimal relief. 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 She went to work earlier this week and had pain in back with lifting items. Describes occsional, brief tingling down the front of her legs that she feels when she is on her feet for extended periods of time.    Otherwise per HPI.  Assessment & Plan: Visit Diagnoses:  1. Acute bilateral low back pain with bilateral sciatica     Plan: Pain appears to be muscular in origin with tight paraspinal muscles, pain over muscles and no pain over spinous processes. Spinal x-rays reviewed with no fractures noted. Will start with muscle relaxer and PT referral. If pain persists in 1-2 weeks, will return for MRI of spine.  Meds & Orders:  Meds ordered this encounter  Medications  . tiZANidine (ZANAFLEX) 2 MG tablet    Sig: Take 1-2 tablets (2-4 mg total) by mouth every 6 (six) hours as needed for muscle spasms.    Dispense:  60 tablet    Refill:  1  . nabumetone (RELAFEN) 750 MG tablet     Sig: Take 1 tablet (750 mg total) by mouth 2 (two) times daily as needed.    Dispense:  60 tablet    Refill:  6    Orders Placed This Encounter  Procedures  . XR Lumbar Spine 2-3 Views    Follow-up: No follow-ups on file.   Procedures: No procedures performed  No notes on file   Clinical History: No specialty comments available.   She reports that she has never smoked. She has never used smokeless tobacco.  Recent Labs    07/01/18 1241  HGBA1C 5.2    Objective:  VS:  HT:    WT:   BMI:     BP:   HR: bpm  TEMP: ( )  RESP:  Physical Exam  PHYSICAL EXAM: Gen: NAD, alert, cooperative with exam, well-appearing HEENT: clear conjunctiva,  CV:  no edema, capillary refill brisk, normal rate Resp: non-labored Skin: no rashes, normal turgor  Neuro: no gross deficits.  Psych:  alert and oriented  Ortho Exam  Lumbar spine: - Inspection: no gross deformity or asymmetry, swelling or ecchymosis - Palpation: No TTP over the spinous processes or SI joints bilaterally. TTP over paraspinal muscles with tightness felt in mid thoracic and lumbar paraspinal muscles - ROM: Reduced forward flexion of lumbar spine secondary to pain. Lumbar extension, lateral bend  without pain - Strength: 5/5 strength of lower extremity in L4-S1 nerve root distributions b/l; normal gait - Neuro: sensation intact in the L4-S1 nerve root distribution b/l, 2+ L4 and S1 reflexes - Special testing: Negative straight leg raise, negative slump, negative Stork test, Negative FABER  Imaging: No results found.  Past Medical/Family/Surgical/Social History: Medications & Allergies reviewed per EMR, new medications updated. Patient Active Problem List   Diagnosis Date Noted  . Palpitations 04/03/2019  . Seasonal allergies 01/16/2019  . Elevated LFTs 11/15/2018  . 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 Hyperlipidemia 07/08/2018  . Asthma 08/20/2017  . Preventative health care 06/21/2017  . Insomnia 04/16/2017   . Foot pain 04/16/2017  . Essential hypertension 04/16/2017   Past Medical History:  Diagnosis Date  . Acute right lower quadrant pain 06/10/2018  . Alcohol abuse   . Asthma   . Hypertension   . Insomnia   . Pain of upper abdomen 06/21/2017  . Spongiotic dermatitis    Family History  Problem Relation Age of Onset  . Arthritis Mother   . Arthritis Father   . Lung cancer Father   . Hyperlipidemia Maternal Grandmother   . Stroke Maternal Grandmother   . Hypertension Maternal Grandmother   . Alcohol abuse Paternal Grandmother   . Arthritis Paternal Grandmother   . Mental illness Paternal Grandmother   . Breast cancer Maternal Aunt   . Epilepsy Brother    Past Surgical History:  Procedure Laterality Date  . CESAREAN SECTION    . FRACTURE SURGERY    . ROBOTIC ASSISTED TOTAL HYSTERECTOMY N/A 03/05/2014   Procedure: ROBOTIC ASSISTED TOTAL HYSTERECTOMY/BILATERAL SALPINGECTOMY;  Surgeon: Marvene Staff, MD;  Location: [place] [facility];  Service: Gynecology;  Laterality: N/A;  . TUBAL LIGATION     Social History   Occupational History  . Not on file  Tobacco Use  . Smoking status: Never Smoker  . Smokeless tobacco: Never Used  Substance and Sexual Activity  . Alcohol use: Yes    Comment: occasion  . Drug use: No  . Sexual activity: Not on file

## 2019-04-25 NOTE — Progress Notes (Signed)
I saw and examined the patient with Dr. Mayer Masker and agree with assessment and plan as outlined.  T-L spine contusion, non-focal exam.  Will keep oow until Monday, start PT and meds.  MRI if not improving.

## 2019-05-02 ENCOUNTER — Encounter: Payer: Self-pay | Admitting: Family Medicine

## 2019-05-08 ENCOUNTER — Encounter: Payer: Self-pay | Admitting: Internal Medicine

## 2019-05-08 ENCOUNTER — Ambulatory Visit (INDEPENDENT_AMBULATORY_CARE_PROVIDER_SITE_OTHER): Payer: 59 | Admitting: Internal Medicine

## 2019-05-08 DIAGNOSIS — J4521 Mild intermittent asthma with (acute) exacerbation: Secondary | ICD-10-CM | POA: Diagnosis not present

## 2019-05-08 MED ORDER — PREDNISONE 10 MG PO TABS: ORAL_TABLET | ORAL | 0 refills | Status: DC

## 2019-05-08 MED ORDER — HYDROCODONE-HOMATROPINE 5-1.5 MG/5ML PO SYRP: 5.0000 mL | ORAL_SOLUTION | Freq: Three times a day (TID) | ORAL | 0 refills | Status: DC | PRN

## 2019-05-08 NOTE — Progress Notes (Signed)
Virtual Visit via Video Note  I connected with Kelly Walton on 05/08/19 at  3:00 PM EDT by a video enabled telemedicine application and verified that I am speaking with the correct person using two identifiers.  Location: Patient: Home Provider: Office   I discussed the limitations of evaluation and management by telemedicine and the availability of in person appointments. The patient expressed understanding and agreed to proceed.  History of Present Illness:  Pt reports cough and wheezing. She reports this started 4 days ago/ The cough is productive of yellow mucous. She has been sneezing but denies ear pain, runny nose, nasal congestion, sore throat or shortness of breath. She denies fever, chills or body aches.  She has tried Delsym and Dayquil with minimal relief of symptoms. She does have a history of asthma, managed on Singulair and Albuterol. She has not had sick contacts that she is aware of.   Past Medical History:  Diagnosis Date  . Acute right lower quadrant pain 06/10/2018  . Alcohol abuse   . Asthma   . Hypertension   . Insomnia   . Pain of upper abdomen 06/21/2017  . Spongiotic dermatitis     Current Outpatient Medications  Medication Sig Dispense Refill  . albuterol (VENTOLIN HFA) 108 (90 Base) MCG/ACT inhaler Inhale 2 puffs into the lungs every 4 (four) hours as needed for wheezing or shortness of breath. 1 Inhaler 0  . amLODipine (NORVASC) 10 MG tablet TAKE 1 TABLET BY MOUTH EVERY DAY 90 tablet 1  . cetirizine (ZYRTEC) 10 MG tablet Take 10 mg by mouth daily.    . montelukast (SINGULAIR) 10 MG tablet Take 1 tablet (10 mg total) by mouth at bedtime. For allergies/asthma. 90 tablet 3  . Multiple Vitamins-Minerals (WOMENS ONE DAILY PO) Take by mouth.    . nabumetone (RELAFEN) 750 MG tablet Take 1 tablet (750 mg total) by mouth 2 (two) times daily as needed. 60 tablet 6  . olopatadine (PATANOL) 0.1 % ophthalmic solution as needed.    Marland Kitchen. POTASSIUM PO Take by mouth daily.     . predniSONE (DELTASONE) 20 MG tablet Take 2 tablets by mouth once daily for 5 days. 10 tablet 0  . tiZANidine (ZANAFLEX) 2 MG tablet Take 1-2 tablets (2-4 mg total) by mouth every 6 (six) hours as needed for muscle spasms. 60 tablet 1  . triamcinolone cream (KENALOG) 0.1 % APPLY TO AFFECTED AREA TWICE A DAY AS NEEDED  1  . zolpidem (AMBIEN) 5 MG tablet Take 1 tablet (5 mg total) by mouth at bedtime as needed for sleep. 90 tablet 0   No current facility-administered medications for this visit.     Allergies  Allergen Reactions  . Promethazine Hcl Hives and Rash  . Sulfa Antibiotics Hives and Rash    Pt states sent her to the hospital.    Family History  Problem Relation Age of Onset  . Arthritis Mother   . Arthritis Father   . Lung cancer Father   . Hyperlipidemia Maternal Grandmother   . Stroke Maternal Grandmother   . Hypertension Maternal Grandmother   . Alcohol abuse Paternal Grandmother   . Arthritis Paternal Grandmother   . Mental illness Paternal Grandmother   . Breast cancer Maternal Aunt   . Epilepsy Brother     Social History   Socioeconomic History  . Marital status: Single    Spouse name: Not on file  . Number of children: Not on file  . Years of education: Not  on file  . Highest education level: Not on file  Occupational History  . Not on file  Social Needs  . Financial resource strain: Not on file  . Food insecurity    Worry: Not on file    Inability: Not on file  . Transportation needs    Medical: Not on file    Non-medical: Not on file  Tobacco Use  . Smoking status: Never Smoker  . Smokeless tobacco: Never Used  Substance and Sexual Activity  . Alcohol use: Yes    Comment: occasion  . Drug use: No  . Sexual activity: Not on file  Lifestyle  . Physical activity    Days per week: Not on file    Minutes per session: Not on file  . Stress: Not on file  Relationships  . Social Musician on phone: Not on file    Gets together:  Not on file    Attends religious service: Not on file    Active member of club or organization: Not on file    Attends meetings of clubs or organizations: Not on file    Relationship status: Not on file  . Intimate partner violence    Fear of current or ex partner: Not on file    Emotionally abused: Not on file    Physically abused: Not on file    Forced sexual activity: Not on file  Other Topics Concern  . Not on file  Social History Narrative   Single.   2 children, 1 step-son.   Manager at [employer].   Enjoys going to [employer], spending time with family.      Constitutional: Denies fever, malaise, fatigue, headache or abrupt weight changes.  HEENT: Denies eye pain, eye redness, ear pain, ringing in the ears, wax buildup, runny nose, nasal congestion, bloody nose, or sore throat. Respiratory: Pt reports cough and wheezing. Denies difficulty breathing, shortness of breath, or sputum production.   Cardiovascular: Denies chest pain, chest tightness, palpitations or swelling in the hands or feet.    No other specific complaints in a complete review of systems (except as listed in HPI above).  Observations/Objective:  Wt Readings from Last 3 Encounters:  04/03/19 119 lb (54 kg)  11/25/18 116 lb (52.6 kg)  11/15/18 119 lb 8 oz (54.2 kg)    General: Appears  her stated age, well developed, well nourished in NAD. HEENT: Head: normal shape and size;  Pulmonary/Chest: Normal effort. No respiratory distress.   Neurological: Alert and oriented.   BMET    Component Value Date/Time   NA 138 06/10/2018 1138   K 4.4 06/10/2018 1138   CL 102 06/10/2018 1138   CO2 29 06/10/2018 1138   GLUCOSE 114 (H) 06/10/2018 1138   BUN 8 06/10/2018 1138   CREATININE 0.89 06/10/2018 1138   CALCIUM 10.1 06/10/2018 1138   GFRNONAA >60 02/26/2017 2305   GFRAA >60 02/26/2017 2305    Lipid Panel     Component Value Date/Time   CHOL 239 (H) 07/22/2018 1133   TRIG 204.0 (H) 07/22/2018 1133   HDL 110.00  07/22/2018 1133   CHOLHDL 2 07/22/2018 1133   VLDL 40.8 (H) 07/22/2018 1133   LDLCALC 116 (H) 06/21/2017 1107    CBC    Component Value Date/Time   WBC 4.5 11/15/2018 1216   RBC 4.22 11/15/2018 1216   HGB 13.3 11/15/2018 1216   HCT 38.9 11/15/2018 1216   PLT 231.0 11/15/2018 1216   MCV 92.1  11/15/2018 1216   MCH 30.5 02/26/2017 2305   MCHC 34.2 11/15/2018 1216   RDW 14.8 11/15/2018 1216   LYMPHSABS 1.6 06/10/2018 1138   MONOABS 0.4 06/10/2018 1138   EOSABS 0.1 06/10/2018 1138   BASOSABS 0.0 06/10/2018 1138    Hgb A1C Lab Results  Component Value Date   HGBA1C 5.2 07/01/2018        Assessment and Plan:  Asthma Exacerbation:  RX for Pred Taper x 9 days Continue Singulair, Albuterol RX for Hycodan for cough No indication for abx at this time  Return precautions discussed  Follow Up Instructions:    I discussed the assessment and treatment plan with the patient. The patient was provided an opportunity to ask questions and all were answered. The patient agreed with the plan and demonstrated an understanding of the instructions.   The patient was advised to call back or seek an in-person evaluation if the symptoms worsen or if the condition fails to improve as anticipated.    Webb Silversmith, NP

## 2019-05-08 NOTE — Patient Instructions (Signed)
Asthma, Adult ° °Asthma is a long-term (chronic) condition in which the airways get tight and narrow. The airways are the breathing passages that lead from the nose and mouth down into the lungs. A person with asthma will have times when symptoms get worse. These are called asthma attacks. They can cause coughing, whistling sounds when you breathe (wheezing), shortness of breath, and chest pain. They can make it hard to breathe. There is no cure for asthma, but medicines and lifestyle changes can help control it. °There are many things that can bring on an asthma attack or make asthma symptoms worse (triggers). Common triggers include: °· Mold. °· Dust. °· Cigarette smoke. °· Cockroaches. °· Things that can cause allergy symptoms (allergens). These include animal skin flakes (dander) and pollen from trees or grass. °· Things that pollute the air. These may include household cleaners, wood smoke, smog, or chemical odors. °· Cold air, weather changes, and wind. °· Crying or laughing hard. °· Stress. °· Certain medicines or drugs. °· Certain foods such as dried fruit, potato chips, and grape juice. °· Infections, such as a cold or the flu. °· Certain medical conditions or diseases. °· Exercise or tiring activities. °Asthma may be treated with medicines and by staying away from the things that cause asthma attacks. Types of medicines may include: °· Controller medicines. These help prevent asthma symptoms. They are usually taken every day. °· Fast-acting reliever or rescue medicines. These quickly relieve asthma symptoms. They are used as needed and provide short-term relief. °· Allergy medicines if your attacks are brought on by allergens. °· Medicines to help control the body's defense (immune) system. °Follow these instructions at home: °Avoiding triggers in your home °· Change your heating and air conditioning filter often. °· Limit your use of fireplaces and wood stoves. °· Get rid of pests (such as roaches and  mice) and their droppings. °· Throw away plants if you see mold on them. °· Clean your floors. Dust regularly. Use cleaning products that do not smell. °· Have someone vacuum when you are not home. Use a vacuum cleaner with a HEPA filter if possible. °· Replace carpet with wood, tile, or vinyl flooring. Carpet can trap animal skin flakes and dust. °· Use allergy-proof pillows, mattress covers, and box spring covers. °· Wash bed sheets and blankets every week in hot water. Dry them in a dryer. °· Keep your bedroom free of any triggers. °· Avoid pets and keep windows closed when things that cause allergy symptoms are in the air. °· Use blankets that are made of polyester or cotton. °· Clean bathrooms and kitchens with bleach. If possible, have someone repaint the walls in these rooms with mold-resistant paint. Keep out of the rooms that are being cleaned and painted. °· Wash your hands often with soap and water. If soap and water are not available, use hand sanitizer. °· Do not allow anyone to smoke in your home. °General instructions °· Take over-the-counter and prescription medicines only as told by your doctor. °? Talk with your doctor if you have questions about how or when to take your medicines. °? Make note if you need to use your medicines more often than usual. °· Do not use any products that contain nicotine or tobacco, such as cigarettes and e-cigarettes. If you need help quitting, ask your doctor. °· Stay away from secondhand smoke. °· Avoid doing things outdoors when allergen counts are high and when air quality is low. °· Wear a ski mask   when doing outdoor activities in the winter. The mask should cover your nose and mouth. Exercise indoors on cold days if you can. °· Warm up before you exercise. Take time to cool down after exercise. °· Use a peak flow meter as told by your doctor. A peak flow meter is a tool that measures how well the lungs are working. °· Keep track of the peak flow meter's readings.  Write them down. °· Follow your asthma action plan. This is a written plan for taking care of your asthma and treating your attacks. °· Make sure you get all the shots (vaccines) that your doctor recommends. Ask your doctor about a flu shot and a pneumonia shot. °· Keep all follow-up visits as told by your doctor. This is important. °Contact a doctor if: °· You have wheezing, shortness of breath, or a cough even while taking medicine to prevent attacks. °· The mucus you cough up (sputum) is thicker than usual. °· The mucus you cough up changes from clear or white to yellow, green, gray, or bloody. °· You have problems from the medicine you are taking, such as: °? A rash. °? Itching. °? Swelling. °? Trouble breathing. °· You need reliever medicines more than 2-3 times a week. °· Your peak flow reading is still at 50-79% of your personal best after following the action plan for 1 hour. °· You have a fever. °Get help right away if: °· You seem to be worse and are not responding to medicine during an asthma attack. °· You are short of breath even at rest. °· You get short of breath when doing very little activity. °· You have trouble eating, drinking, or talking. °· You have chest pain or tightness. °· You have a fast heartbeat. °· Your lips or fingernails start to turn blue. °· You are light-headed or dizzy, or you faint. °· Your peak flow is less than 50% of your personal best. °· You feel too tired to breathe normally. °Summary °· Asthma is a long-term (chronic) condition in which the airways get tight and narrow. An asthma attack can make it hard to breathe. °· Asthma cannot be cured, but medicines and lifestyle changes can help control it. °· Make sure you understand how to avoid triggers and how and when to use your medicines. °This information is not intended to replace advice given to you by your health care provider. Make sure you discuss any questions you have with your health care provider. °Document  Released: 02/10/2008 Document Revised: 10/27/2018 Document Reviewed: 09/28/2016 °Elsevier Patient Education © 2020 Else[employer] Inc[employer] ° °

## 2019-05-11 ENCOUNTER — Encounter: Payer: Self-pay | Admitting: Cardiovascular Disease

## 2019-05-11 ENCOUNTER — Ambulatory Visit (INDEPENDENT_AMBULATORY_CARE_PROVIDER_SITE_OTHER): Payer: 59

## 2019-05-11 ENCOUNTER — Other Ambulatory Visit: Payer: Self-pay

## 2019-05-11 ENCOUNTER — Ambulatory Visit (INDEPENDENT_AMBULATORY_CARE_PROVIDER_SITE_OTHER): Payer: 59 | Admitting: Cardiovascular Disease

## 2019-05-11 VITALS — BP 142/92 | HR 123 | Ht 65.0 in | Wt 117.0 lb

## 2019-05-11 DIAGNOSIS — R002 Palpitations: Secondary | ICD-10-CM | POA: Diagnosis not present

## 2019-05-11 DIAGNOSIS — R000 Tachycardia, unspecified: Secondary | ICD-10-CM

## 2019-05-11 DIAGNOSIS — R0602 Shortness of breath: Secondary | ICD-10-CM | POA: Diagnosis not present

## 2019-05-11 NOTE — Progress Notes (Signed)
Cardiology Office Note   Date:  05/11/2019   ID:  Maurie Boettcherammy A Bielak, DOB 1971/04/17, MRN [id number]  PCP:  Doreene Nestlark, Katherine K, NP  Cardiologist:   Lorine BearsMuhammad Nylee Barbuto, MD   Chief Complaint  Patient presents with  . other    Palpitations. Meds reviewed verbally with pt.      History of Present Illness: Charmane A [name] is a 48 y.o. female who was referred by Vernona RiegerKatherine Clark for evaluation of palpitations.  She has known history of essential hypertension and asthma.  She has no prior cardiac history.  She is a lifelong non-smoker.  Family history is remarkable for essential hypertension.  Her mother also had previous MIs and strokes.  She works at [employer].  She has been having difficulties over the last few months with shortness of breath and has hard time keeping the mask on throughout her shift at work.  She noticed tachycardia and palpitations which has worsened over the last few months.  This is associated with shortness of breath without chest pain.  No dizziness, syncope or presyncope.  She does not consume any caffeinated products.  She denies excessive stress.  She was placed recently on prednisone for allergies.  She reports that her asthma is overall controlled with Singulair but occasionally she has to use her rescue inhaler on average of twice a week.  She denies any change in her weight.  She does not exercise (.    Past Medical History:  Diagnosis Date  . Acute right lower quadrant pain 06/10/2018  . Alcohol abuse   . Asthma   . Hypertension   . Insomnia   . Pain of upper abdomen 06/21/2017  . Spongiotic dermatitis     Past Surgical History:  Procedure Laterality Date  . CESAREAN SECTION    . FRACTURE SURGERY    . ROBOTIC ASSISTED TOTAL HYSTERECTOMY N/A 03/05/2014   Procedure: ROBOTIC ASSISTED TOTAL HYSTERECTOMY/BILATERAL SALPINGECTOMY;  Surgeon: Serita KyleSheronette A Cousins, MD;  Location: [facility];  Service: Gynecology;  Laterality: N/A;  . TUBAL LIGATION       Current Outpatient  Medications  Medication Sig Dispense Refill  . albuterol (VENTOLIN HFA) 108 (90 Base) MCG/ACT inhaler Inhale 2 puffs into the lungs every 4 (four) hours as needed for wheezing or shortness of breath. 1 Inhaler 0  . amLODipine (NORVASC) 10 MG tablet TAKE 1 TABLET BY MOUTH EVERY DAY 90 tablet 1  . cetirizine (ZYRTEC) 10 MG tablet Take 10 mg by mouth daily.    Marland Kitchen. HYDROcodone-homatropine (HYCODAN) 5-1.5 MG/5ML syrup Take 5 mLs by mouth every 8 (eight) hours as needed for cough. 120 mL 0  . montelukast (SINGULAIR) 10 MG tablet Take 1 tablet (10 mg total) by mouth at bedtime. For allergies/asthma. 90 tablet 3  . Multiple Vitamins-Minerals (WOMENS ONE DAILY PO) Take by mouth.    . predniSONE (DELTASONE) 10 MG tablet Take 6 tabs day 1, 5 tabs day 2, 4 tabs day 3, 3 tabs day 4, 2 tabs day 5, 1 tab day 6 21 tablet 0  . tiZANidine (ZANAFLEX) 2 MG tablet Take 1-2 tablets (2-4 mg total) by mouth every 6 (six) hours as needed for muscle spasms. 60 tablet 1  . triamcinolone cream (KENALOG) 0.1 % APPLY TO AFFECTED AREA TWICE A DAY AS NEEDED  1  . zolpidem (AMBIEN) 5 MG tablet Take 1 tablet (5 mg total) by mouth at bedtime as needed for sleep. 90 tablet 0   No current facility-administered medications for this  visit.     Allergies:   Promethazine hcl and Sulfa antibiotics    Social History:  The patient  reports that she has never smoked. She has never used smokeless tobacco. She reports current alcohol use. She reports that she does not use drugs.   Family History:  The patient's family history includes Alcohol abuse in her paternal grandmother; Arthritis in her father, mother, and paternal grandmother; Breast cancer in her maternal aunt; Epilepsy in her brother; Heart attack in her father and maternal grandmother; Hyperlipidemia in her maternal grandmother; Hypertension in her maternal grandmother; Lung cancer in her father; Mental illness in her paternal grandmother; Stroke in her maternal grandmother.     ROS:  Please see the history of present illness.   Otherwise, review of systems are positive for none.   All other systems are reviewed and negative.    PHYSICAL EXAM: VS:  BP (!) 142/92 (BP Location: Right Arm, Patient Position: Sitting, Cuff Size: Normal)   Pulse (!) 123   Ht 5\' 5"  (1.651 m)   Wt 117 lb (53.1 kg)   LMP 03/05/2014   SpO2 96%   BMI 19.47 kg/m  , BMI Body mass index is 19.47 kg/m. GEN: Well nourished, well developed, in no acute distress  HEENT: normal  Neck: no JVD, carotid bruits, or masses Cardiac: Regular and tachycardic; no murmurs, rubs, or gallops,no edema  Respiratory:  clear to auscultation bilaterally, normal work of breathing GI: soft, nontender, nondistended, + BS MS: no deformity or atrophy  Skin: warm and dry, no rash Neuro:  Strength and sensation are intact Psych: euthymic mood, full affect   EKG:  EKG is ordered today. The ekg ordered today demonstrates sinus tachycardia with no significant ST or T wave changes.   Recent Labs: 06/10/2018: ALT 31; BUN 8; Creatinine, Ser 0.89; Potassium 4.4; Sodium 138 11/15/2018: Hemoglobin 13.3; Platelets 231.0; TSH 1.13    Lipid Panel    Component Value Date/Time   CHOL 239 (H) 07/22/2018 1133   TRIG 204.0 (H) 07/22/2018 1133   HDL 110.00 07/22/2018 1133   CHOLHDL 2 07/22/2018 1133   VLDL 40.8 (H) 07/22/2018 1133   LDLCALC 116 (H) 06/21/2017 1107   LDLDIRECT 87.0 07/22/2018 1133      Wt Readings from Last 3 Encounters:  05/11/19 117 lb (53.1 kg)  04/03/19 119 lb (54 kg)  11/25/18 116 lb (52.6 kg)        PAD Screen 05/11/2019  Previous PAD dx? No  Previous surgical procedure? Yes  Pain with walking? No  Feet/toe relief with dangling? No  Painful, non-healing ulcers? No  Extremities discolored? No      ASSESSMENT AND PLAN:  1.  Palpitations and sinus tachycardia: The exact etiology of this is not entirely clear.  There might be a component of myocardial stress related to her recent  worsening of allergies and asthma.  She also seems to be stressed about having to wear the mask which worsens her dyspnea.  I think we have to exclude underlying metabolic abnormalities and thus I requested routine labs including CBC, basic metabolic profile, TSH and d-dimer. I am going to place a 2-week outpatient monitor.  We should likely avoid treatment with beta-blockers given her asthma but potentially switch amlodipine to diltiazem if needed.  2.  Shortness of breath: Could be related to her asthma.  I requested an echocardiogram.    Disposition:   FU with me in 1 month  Signed,  Kathlyn Sacramento, MD  05/11/2019  9:11 AM    [place]

## 2019-05-11 NOTE — Patient Instructions (Signed)
Medication Instructions:  Your physician recommends that you continue on your current medications as directed. Please refer to the Current Medication list given to you today.  If you need a refill on your cardiac medications before your next appointment, please call your pharmacy.   Lab work: Art gallery manager, Psychologist, occupational, Tsh, D-Dimer today If you have labs (blood work) drawn today and your tests are completely normal, you will receive your results only by: Marland Kitchen MyChart Message (if you have MyChart) OR . A paper copy in the mail If you have any lab test that is abnormal or we need to change your treatment, we will call you to review the results.  Testing/Procedures: Your physician has requested that you have an echocardiogram. Echocardiography is a painless test that uses sound waves to create images of your heart. It provides your doctor with information about the size and shape of your heart and how well your heart's chambers and valves are working. This procedure takes approximately one hour. There are no restrictions for this procedure.  Your physician has recommended that you wear an zio-monitor. Zio- monitors are medical devices that record the heart's electrical activity. Doctors most often [place] these monitors to diagnose arrhythmias. Arrhythmias are problems with the speed or rhythm of the heartbeat. The monitor is a small, portable device. You can wear one while you do your normal daily activities. This is usually used to diagnose what is causing palpitations/syncope (passing out).    Follow-Up: At [facility], you and your health needs are our priority.  As part of our continuing mission to provide you with exceptional heart care, we have created designated Provider Care Teams.  These Care Teams include your primary Cardiologist (physician) and Advanced Practice Providers (APPs -  Physician Assistants and Nurse Practitioners) who all work together to provide you with the care you need, when you need  it. You will need a follow up appointment after testing .  You may see  Dr. Fletcher Anon or one of the following Advanced Practice Providers on your designated Care Team:   Murray Hodgkins, NP Christell Faith, PA-C . Marrianne Mood, PA-C  Any Other Special Instructions Will Be Listed Below (If Applicable). N/A

## 2019-05-12 ENCOUNTER — Encounter: Payer: Self-pay | Admitting: Internal Medicine

## 2019-05-12 LAB — BASIC METABOLIC PANEL
BUN/Creatinine Ratio: 16 (ref 9–23)
BUN: 14 mg/dL (ref 6–24)
CO2: 23 mmol/L (ref 20–29)
Calcium: 9.8 mg/dL (ref 8.7–10.2)
Chloride: 100 mmol/L (ref 96–106)
Creatinine, Ser: 0.89 mg/dL (ref 0.57–1.00)
GFR calc Af Amer: 89 mL/min/{1.73_m2} (ref >59)
GFR calc non Af Amer: 77 mL/min/{1.73_m2} (ref >59)
Glucose: 87 mg/dL (ref 65–99)
Potassium: 4 mmol/L (ref 3.5–5.2)
Sodium: 141 mmol/L (ref 134–144)

## 2019-05-12 LAB — CBC WITH DIFFERENTIAL/PLATELET
Basophils Absolute: 0 10*3/uL (ref 0.0–0.2)
Basos: 0 %
EOS (ABSOLUTE): 0 10*3/uL (ref 0.0–0.4)
Eos: 0 %
Hematocrit: 40.2 % (ref 34.0–46.6)
Hemoglobin: 13.6 g/dL (ref 11.1–15.9)
Immature Grans (Abs): 0 10*3/uL (ref 0.0–0.1)
Immature Granulocytes: 0 %
Lymphocytes Absolute: 1.7 10*3/uL (ref 0.7–3.1)
Lymphs: 23 %
MCH: 30.2 pg (ref 26.6–33.0)
MCHC: 33.8 g/dL (ref 31.5–35.7)
MCV: 89 fL (ref 79–97)
Monocytes Absolute: 0.8 10*3/uL (ref 0.1–0.9)
Monocytes: 11 %
Neutrophils Absolute: 4.8 10*3/uL (ref 1.4–7.0)
Neutrophils: 66 %
Platelets: 305 10*3/uL (ref 150–450)
RBC: 4.5 x10E6/uL (ref 3.77–5.28)
RDW: 13.4 % (ref 11.7–15.4)
WBC: 7.4 10*3/uL (ref 3.4–10.8)

## 2019-05-12 LAB — D-DIMER, QUANTITATIVE: D-DIMER: 0.28 mg/L FEU (ref 0.00–0.49)

## 2019-05-12 LAB — TSH: TSH: 0.637 u[IU]/mL (ref 0.450–4.500)

## 2019-05-12 MED ORDER — AMOXICILLIN-POT CLAVULANATE 875-125 MG PO TABS: 1.0000 | ORAL_TABLET | Freq: Two times a day (BID) | ORAL | 0 refills | Status: DC

## 2019-05-23 ENCOUNTER — Other Ambulatory Visit: Payer: Self-pay | Admitting: Cardiovascular Disease

## 2019-05-23 DIAGNOSIS — R0602 Shortness of breath: Secondary | ICD-10-CM

## 2019-06-01 ENCOUNTER — Other Ambulatory Visit: Payer: Self-pay

## 2019-06-01 ENCOUNTER — Ambulatory Visit (INDEPENDENT_AMBULATORY_CARE_PROVIDER_SITE_OTHER): Payer: 59

## 2019-06-01 DIAGNOSIS — R0602 Shortness of breath: Secondary | ICD-10-CM | POA: Diagnosis not present

## 2019-06-05 ENCOUNTER — Ambulatory Visit: Payer: 59 | Admitting: Physician Assistant

## 2019-06-05 ENCOUNTER — Telehealth: Payer: Self-pay | Admitting: Cardiovascular Disease

## 2019-06-05 NOTE — Telephone Encounter (Signed)
Patient made of echo results with verbalized understanding. Adv the patient that her cardiac monitor results are still pending. Adv the patient that I will call back with the results when available. Patient verbalized understanding.

## 2019-06-05 NOTE — Telephone Encounter (Signed)
Echo results are awaiting review and signature by the MD. zio monitor results are not yet available.

## 2019-06-05 NOTE — Telephone Encounter (Signed)
Patient was cancelled for R. Dunn on 9/28 Patient unable to make next available Would like to speak with nurse for results and to see if sooner appt needed Please call

## 2019-06-05 NOTE — Telephone Encounter (Signed)
See result note.  

## 2019-06-05 NOTE — Telephone Encounter (Signed)
I do not know what she is talking about. The prior auth is good until 03/2022. Prior Josem Kaufmann are done yearly not by refills.  Last prescribed on 03/20/2019 #90 with 0 refills. Last seen on 05/08/2019. No future appointment.

## 2019-06-06 ENCOUNTER — Other Ambulatory Visit: Payer: Self-pay | Admitting: Primary Care

## 2019-06-06 DIAGNOSIS — G4700 Insomnia, unspecified: Secondary | ICD-10-CM

## 2019-06-06 NOTE — Telephone Encounter (Signed)
Per notes from pharmacy this refill request is to put on file and notes in the RX says patient is aware she can not pick this up until 06/21/2019. Last filled on 03/20/2019 #90 with 0 refill. LOV 05/08/2019 with Webb Silversmith for acute visit.

## 2019-06-06 NOTE — Telephone Encounter (Signed)
Noted.  Refill sent to pharmacy. 

## 2019-06-16 ENCOUNTER — Telehealth: Payer: Self-pay

## 2019-06-16 ENCOUNTER — Other Ambulatory Visit: Payer: Self-pay | Admitting: Primary Care

## 2019-06-16 DIAGNOSIS — I10 Essential (primary) hypertension: Secondary | ICD-10-CM

## 2019-06-16 MED ORDER — DILTIAZEM HCL ER COATED BEADS 180 MG PO CP24: 180.0000 mg | ORAL_CAPSULE | Freq: Every day | ORAL | 1 refills | Status: DC

## 2019-06-16 NOTE — Telephone Encounter (Signed)
Patient made aware of cardiac monitor results and Dr. Tyrell Antonio recommendation. Patient is agreeable with stopping Amlodipine and starting Diltiazem 180mg  daily. An Rx has been sent to the patient's pharmacy. F/u appt scheduled with Ignacia Bayley, NP on 07/21/19 @ 1:30pm.

## 2019-06-16 NOTE — Telephone Encounter (Signed)
Patient returning call.

## 2019-06-16 NOTE — Telephone Encounter (Signed)
Called to give the patient cardiac monitor results and Dr. Arida's recommendation. [email or web address]. 

## 2019-06-16 NOTE — Telephone Encounter (Signed)
-----   Message from Wellington Hampshire, MD sent at 06/16/2019 12:31 PM EDT ----- Inform patient that monitor showed intermittent sinus tachycardia and short runs of supraventricular tachycardia.  None of these are serious but they are the likely cause of her symptoms. I recommend stopping amlodipine and starting diltiazem extended release 180 mg once daily. She should return for follow-up in 1 month to evaluate her response.

## 2019-07-21 ENCOUNTER — Ambulatory Visit: Payer: 59 | Admitting: Nurse Practitioner

## 2019-08-16 ENCOUNTER — Ambulatory Visit (INDEPENDENT_AMBULATORY_CARE_PROVIDER_SITE_OTHER): Payer: 59 | Admitting: Nurse Practitioner

## 2019-08-16 ENCOUNTER — Encounter: Payer: Self-pay | Admitting: Nurse Practitioner

## 2019-08-16 ENCOUNTER — Other Ambulatory Visit: Payer: Self-pay

## 2019-08-16 VITALS — BP 110/80 | HR 76 | Temp 97.1°F | Ht 66.0 in | Wt 114.2 lb

## 2019-08-16 DIAGNOSIS — I10 Essential (primary) hypertension: Secondary | ICD-10-CM

## 2019-08-16 DIAGNOSIS — R002 Palpitations: Secondary | ICD-10-CM

## 2019-08-16 NOTE — Progress Notes (Signed)
Office Visit    Patient Name: Kelly Walton Date of Encounter: 08/16/2019  Primary Care Provider:  Pleas Koch, NP Primary Cardiologist:  Kathlyn Sacramento, MD  Chief Complaint    48 year old female with a history of hypertension and asthma, who presents for follow-up related to palpitations.  Past Medical History    Past Medical History:  Diagnosis Date   Acute right lower quadrant pain 06/10/2018   Alcohol abuse    Asthma    Hypertension    Insomnia    Pain of upper abdomen 06/21/2017   Palpitations    a. 05/2019 Echo: EF 60-65%, diast dysfxn; b. 05/2019 Zio: Sinus rhythm-sinus tach. Avg HR 105. 6 SVT episodes - longest 7 beats. Rare PVCs-->Diltiazem added.   Spongiotic dermatitis    Past Surgical History:  Procedure Laterality Date   CESAREAN SECTION     FRACTURE SURGERY     ROBOTIC ASSISTED TOTAL HYSTERECTOMY N/A 03/05/2014   Procedure: ROBOTIC ASSISTED TOTAL HYSTERECTOMY/BILATERAL SALPINGECTOMY;  Surgeon: Marvene Staff, MD;  Location: [facility];  Service: Gynecology;  Laterality: N/A;   TUBAL LIGATION      Allergies  Allergies  Allergen Reactions   Promethazine Hcl Hives and Rash   Sulfa Antibiotics Hives and Rash    Pt states sent her to the hospital.    History of Present Illness    48 year old female with a history of hypertension, asthma, remote drug and alcohol abuse, and palpitations.  She saw Dr. Fletcher Anon in early September due to episodic tachypalpitations associated with dyspnea.  EKG that date showed sinus tachycardia.  This was followed by echocardiography, which showed normal LV function.  Event monitoring showed an average heart rate of 105 bpm and sinus tachycardia with 6 SVT episodes, the longest lasting 7 beats.  Rare PVCs were also noted.  Diltiazem 180 mg daily was added in the setting of resting sinus tachycardia and brief runs of SVT.  She says that since starting diltiazem, she cannot be sure if her episodes of tachycardia  have changed much.  Episodes are most noticeable during periods of stress and recognition of elevated heart rates often creates additional anxiety.  She does not experience presyncope, syncope, or chest pain during episodes.  She sometimes notes dyspnea.  She denies PND, orthopnea, syncope, edema, or early satiety.  Home Medications    Prior to Admission medications   Medication Sig Start Date End Date Taking? Authorizing Provider  albuterol (VENTOLIN HFA) 108 (90 Base) MCG/ACT inhaler Inhale 2 puffs into the lungs every 4 (four) hours as needed for wheezing or shortness of breath. 02/16/19   Pleas Koch, NP  amoxicillin-clavulanate (AUGMENTIN) 875-125 MG tablet Take 1 tablet by mouth 2 (two) times daily. 05/12/19   Jearld Fenton, NP  cetirizine (ZYRTEC) 10 MG tablet Take 10 mg by mouth daily.    [provider]  diltiazem (CARDIZEM CD) 180 MG 24 hr capsule Take 1 capsule (180 mg total) by mouth daily. 06/16/19 09/14/19  Wellington Hampshire, MD  HYDROcodone-homatropine (HYCODAN) 5-1.5 MG/5ML syrup Take 5 mLs by mouth every 8 (eight) hours as needed for cough. 05/08/19   Jearld Fenton, NP  montelukast (SINGULAIR) 10 MG tablet Take 1 tablet (10 mg total) by mouth at bedtime. For allergies/asthma. 08/01/18   Pleas Koch, NP  Multiple Vitamins-Minerals (WOMENS ONE DAILY PO) Take by mouth.    [provider]  predniSONE (DELTASONE) 10 MG tablet Take 6 tabs day 1, 5 tabs day  2, 4 tabs day 3, 3 tabs day 4, 2 tabs day 5, 1 tab day 6 05/08/19   Baity, Salvadore Oxfordegina W, NP  tiZANidine (ZANAFLEX) 2 MG tablet Take 1-2 tablets (2-4 mg total) by mouth every 6 (six) hours as needed for muscle spasms. 04/25/19   Hilts, Casimiro NeedleMichael, MD  triamcinolone cream (KENALOG) 0.1 % APPLY TO AFFECTED AREA TWICE A DAY AS NEEDED 05/28/18   [provider]  zolpidem (AMBIEN) 5 MG tablet TAKE 1 TABLET (5 MG TOTAL) BY MOUTH AT BEDTIME AS NEEDED FOR SLEEP. 06/06/19   Doreene Nestlark, Katherine K, NP    Review of Systems      Ongoing intermittent elevations in heart rate which she describes as her heart pounding out of her chest.  She denies chest pain, PND, orthopnea, dizziness, syncope, edema, or early satiety.  All other systems reviewed and are otherwise negative except as noted above.  Physical Exam    VS:  BP 110/80 (BP Location: Left Arm, Patient Position: Sitting, Cuff Size: Normal)    Pulse 76    Temp (!) 97.1 F (36.2 C)    Ht 5\' 6"  (1.676 m)    Wt 114 lb 4 oz (51.8 kg)    LMP 03/05/2014    SpO2 98%    BMI 18.44 kg/m  , BMI Body mass index is 18.44 kg/m. GEN: Well nourished, well developed, in no acute distress. HEENT: normal. Neck: Supple, no JVD, carotid bruits, or masses. Cardiac: RRR, no murmurs, rubs, or gallops. No clubbing, cyanosis, edema.  Radials/PT 2+ and equal bilaterally.  Respiratory:  Respirations regular and unlabored, clear to auscultation bilaterally. GI: Soft, nontender, nondistended, BS + x 4. MS: no deformity or atrophy. Skin: warm and dry, no rash. Neuro:  Strength and sensation are intact. Psych: Normal affect.  Accessory Clinical Findings    ECG personally reviewed by me today -regular sinus rhythm, 76 - no acute changes.  Lab Results  Component Value Date   WBC 7.4 05/11/2019   HGB 13.6 05/11/2019   HCT 40.2 05/11/2019   MCV 89 05/11/2019   PLT 305 05/11/2019   Lab Results  Component Value Date   CREATININE 0.89 05/11/2019   BUN 14 05/11/2019   NA 141 05/11/2019   K 4.0 05/11/2019   CL 100 05/11/2019   CO2 23 05/11/2019   Lab Results  Component Value Date   ALT 31 06/10/2018   AST 31 06/10/2018   ALKPHOS 83 06/10/2018   BILITOT 0.6 06/10/2018   Lab Results  Component Value Date   CHOL 239 (H) 07/22/2018   HDL 110.00 07/22/2018   LDLCALC 116 (H) 06/21/2017   LDLDIRECT 87.0 07/22/2018   TRIG 204.0 (H) 07/22/2018   CHOLHDL 2 07/22/2018    Lab Results  Component Value Date   HGBA1C 5.2 07/01/2018   Lab Results  Component Value Date   TSH  0.637 05/11/2019    Assessment & Plan    1.  Palpitations: Patient previously evaluated secondary to palpitations and tachycardia with finding on event monitoring of baseline sinus tachycardia with an average rate of 105 bpm along with a few brief runs of SVT.  She has been on diltiazem 180 mg since early February.  She is not sure if this has made a significant impact in her episodic tachycardia.  It is notable however, that her heart rate is 76 today.   There does seem to be a component of anxiety that exacerbates symptoms but with not clear that anxiety by  itself is an initial trigger.  Prior lab evaluation showed normal electrolytes, renal function, CBC, D-dimer, and TSH.  We discussed event monitoring and echocardiography (normal LV function) findings at length today, and I offered reassurance and encouraged her to resume normal activities.  She is hoping to begin exercising with a family member and advised that nothing we found should prevent her from doing so.  Continue diltiazem at current dose.  She does have an [employer] device @ home and we discussed that if she has extremes of tachycardia or persistent tachycardia, she can check her own rhythm and upload strips to my chart for [place] to review.  2.  Essential hypertension: This is stable on diltiazem therapy.  3.  Asthma: She reports stable symptoms.  No wheezing today.  4.  Disposition: Follow-up in clinic in 3 months or sooner if necessary.   Nicolasa Ducking, NP 08/16/2019, 3:23 PM

## 2019-08-16 NOTE — Patient Instructions (Signed)
Medication Instructions:  Your physician recommends that you continue on your current medications as directed. Please refer to the Current Medication list given to you today.  *If you need a refill on your cardiac medications before your next appointment, please call your pharmacy*  Lab Work: None ordered  If you have labs (blood work) drawn today and your tests are completely normal, you will receive your results only by: . MyChart Message (if you have MyChart) OR . A paper copy in the mail If you have any lab test that is abnormal or we need to change your treatment, we will call you to review the results.  Testing/Procedures: None ordered   Follow-Up: At [facility], you and your health needs are our priority.  As part of our continuing mission to provide you with exceptional heart care, we have created designated Provider Care Teams.  These Care Teams include your primary Cardiologist (physician) and Advanced Practice Providers (APPs -  Physician Assistants and Nurse Practitioners) who all work together to provide you with the care you need, when you need it.  Your next appointment:   3 month(s)  The format for your next appointment:   In Person  Provider:    You may see Muhammad Arida, MD or Christopher Berge, NP.   

## 2019-08-25 ENCOUNTER — Other Ambulatory Visit: Payer: Self-pay | Admitting: Primary Care

## 2019-08-25 DIAGNOSIS — J4520 Mild intermittent asthma, uncomplicated: Secondary | ICD-10-CM

## 2019-09-02 ENCOUNTER — Emergency Department (HOSPITAL_COMMUNITY)
Admission: EM | Admit: 2019-09-02 | Discharge: 2019-09-03 | Disposition: A | Discharge status: Home / Self Care | Payer: No Typology Code available for payment source | Attending: Emergency Medicine | Admitting: Emergency Medicine

## 2019-09-02 ENCOUNTER — Encounter (HOSPITAL_COMMUNITY): Payer: Self-pay

## 2019-09-02 ENCOUNTER — Emergency Department (HOSPITAL_COMMUNITY): Payer: No Typology Code available for payment source

## 2019-09-02 ENCOUNTER — Other Ambulatory Visit: Payer: Self-pay

## 2019-09-02 DIAGNOSIS — E860 Dehydration: Secondary | ICD-10-CM

## 2019-09-02 DIAGNOSIS — Z79899 Other long term (current) drug therapy: Secondary | ICD-10-CM | POA: Diagnosis not present

## 2019-09-02 DIAGNOSIS — Z20828 Contact with and (suspected) exposure to other viral communicable diseases: Secondary | ICD-10-CM | POA: Diagnosis not present

## 2019-09-02 DIAGNOSIS — I10 Essential (primary) hypertension: Secondary | ICD-10-CM | POA: Insufficient documentation

## 2019-09-02 DIAGNOSIS — R7989 Other specified abnormal findings of blood chemistry: Secondary | ICD-10-CM | POA: Diagnosis not present

## 2019-09-02 DIAGNOSIS — R112 Nausea with vomiting, unspecified: Secondary | ICD-10-CM | POA: Diagnosis not present

## 2019-09-02 DIAGNOSIS — J45909 Unspecified asthma, uncomplicated: Secondary | ICD-10-CM | POA: Insufficient documentation

## 2019-09-02 LAB — CBC WITH DIFFERENTIAL/PLATELET
Abs Immature Granulocytes: 0.03 10*3/uL (ref 0.00–0.07)
Basophils Absolute: 0 10*3/uL (ref 0.0–0.1)
Basophils Relative: 1 %
Eosinophils Absolute: 0 10*3/uL (ref 0.0–0.5)
Eosinophils Relative: 0 %
HCT: 41.7 % (ref 36.0–46.0)
Hemoglobin: 13.9 g/dL (ref 12.0–15.0)
Immature Granulocytes: 0 %
Lymphocytes Relative: 13 %
Lymphs Abs: 1.1 10*3/uL (ref 0.7–4.0)
MCH: 32.4 pg (ref 26.0–34.0)
MCHC: 33.3 g/dL (ref 30.0–36.0)
MCV: 97.2 fL (ref 80.0–100.0)
Monocytes Absolute: 0.3 10*3/uL (ref 0.1–1.0)
Monocytes Relative: 4 %
Neutro Abs: 6.8 10*3/uL (ref 1.7–7.7)
Neutrophils Relative %: 82 %
Platelets: 147 10*3/uL — ABNORMAL LOW (ref 150–400)
RBC: 4.29 MIL/uL (ref 3.87–5.11)
RDW: 13.1 % (ref 11.5–15.5)
WBC: 8.3 10*3/uL (ref 4.0–10.5)
nRBC: 0 % (ref 0.0–0.2)

## 2019-09-02 LAB — COMPREHENSIVE METABOLIC PANEL
ALT: 132 U/L — ABNORMAL HIGH (ref 0–44)
AST: 180 U/L — ABNORMAL HIGH (ref 15–41)
Albumin: 4.1 g/dL (ref 3.5–5.0)
Alkaline Phosphatase: 156 U/L — ABNORMAL HIGH (ref 38–126)
Anion gap: 12 (ref 5–15)
BUN: 6 mg/dL (ref 6–20)
CO2: 23 mmol/L (ref 22–32)
Calcium: 8.7 mg/dL — ABNORMAL LOW (ref 8.9–10.3)
Chloride: 102 mmol/L (ref 98–111)
Creatinine, Ser: 0.94 mg/dL (ref 0.44–1.00)
GFR calc Af Amer: >60 mL/min (ref >60)
GFR calc non Af Amer: >60 mL/min (ref >60)
Glucose, Bld: 122 mg/dL — ABNORMAL HIGH (ref 70–99)
Potassium: 3.3 mmol/L — ABNORMAL LOW (ref 3.5–5.1)
Sodium: 137 mmol/L (ref 135–145)
Total Bilirubin: 1.6 mg/dL — ABNORMAL HIGH (ref 0.3–1.2)
Total Protein: 7.4 g/dL (ref 6.5–8.1)

## 2019-09-02 LAB — POC SARS CORONAVIRUS 2 AG -  ED: SARS Coronavirus 2 Ag: NEGATIVE

## 2019-09-02 MED ORDER — SODIUM CHLORIDE 0.9 % IV BOLUS
1000.0000 mL | Freq: Once | INTRAVENOUS | Status: AC
  Administered 2019-09-02: 1000 mL via INTRAVENOUS

## 2019-09-02 MED ORDER — ONDANSETRON HCL 4 MG PO TABS: 4.0000 mg | ORAL_TABLET | Freq: Three times a day (TID) | ORAL | 0 refills | Status: DC | PRN

## 2019-09-02 MED ORDER — ONDANSETRON HCL 4 MG/2ML IJ SOLN
4.0000 mg | Freq: Once | INTRAMUSCULAR | Status: AC
  Administered 2019-09-02: 4 mg via INTRAVENOUS
  Filled 2019-09-02: qty 2

## 2019-09-02 MED ORDER — ACETAMINOPHEN 500 MG PO TABS
1000.0000 mg | ORAL_TABLET | Freq: Once | ORAL | Status: AC
  Administered 2019-09-02: 1000 mg via ORAL
  Filled 2019-09-02: qty 2

## 2019-09-02 NOTE — Discharge Instructions (Signed)
Use Zofran as needed for nausea or vomiting. Make sure you are staying well-hydrated water. Follow-up with your primary care doctor for recheck of your liver enzymes.  In the meantime, avoid things that may irritate your liver such as Tylenol and alcohol.  You were tested for coronavirus today.  If results are positive, you will receive a phone call.  If negative, you will not.  Either way, you may check online on MyChart. Return to the ER with any new, worsening, or concerning symptoms.

## 2019-09-02 NOTE — ED Provider Notes (Signed)
[facility] DEPT Provider Note   CSN: [id number] Arrival date & time: 09/02/19  1845     History Chief Complaint  Patient presents with  . Influenza    Kelly Walton is a 48 y.o. female presenting for evaluation of nausea, vomiting, and flulike symptoms.  Patient states that the past 4 days, she has not been feeling well.  She reports chills and cough.  She has nausea with vomiting.  Patient states her mom tested positive for the flu and she was taking care of her.  She subsequently started to get sick.  She states she felt better today for a few hours, tried to go to work, and subsequently got sick again.  She has not taken anything for her symptoms that she cannot keep anything down.  She denies chest pain, shortness of breath, abdominal pain, urinary symptoms, abnormal bowel movements.  Patient states she used to drink heavily, but now she only drinks occasionally.  Last drink was sometime last week.  She denies tobacco or drug use.  Additional history obtained from triage note.  Per triage note, patient's fianc reported in confidence that the patient continues to drink a lot but is very secretive about it.  HPI     Past Medical History:  Diagnosis Date  . Acute right lower quadrant pain 06/10/2018  . Alcohol abuse   . Asthma   . Hypertension   . Insomnia   . Pain of upper abdomen 06/21/2017  . Palpitations    a. 05/2019 Echo: EF 60-65%, diast dysfxn; b. 05/2019 Zio: Sinus rhythm-sinus tach. Avg HR 105. 6 SVT episodes - longest 7 beats. Rare PVCs-->Diltiazem added.  Marland Kitchen Spongiotic dermatitis     Patient Active Problem List   Diagnosis Date Noted  . Palpitations 04/03/2019  . Seasonal allergies 01/16/2019  . Elevated LFTs 11/15/2018  . 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 Hyperlipidemia 07/08/2018  . Asthma 08/20/2017  . Preventative health care 06/21/2017  . Insomnia 04/16/2017  . Foot pain 04/16/2017  . Essential hypertension 04/16/2017      Past Surgical History:  Procedure Laterality Date  . CESAREAN SECTION    . FRACTURE SURGERY    . ROBOTIC ASSISTED TOTAL HYSTERECTOMY N/A 03/05/2014   Procedure: ROBOTIC ASSISTED TOTAL HYSTERECTOMY/BILATERAL SALPINGECTOMY;  Surgeon: Marvene Staff, MD;  Location: [facility];  Service: Gynecology;  Laterality: N/A;  . TUBAL LIGATION       OB History   No obstetric history on file.     Family History  Problem Relation Age of Onset  . Arthritis Mother   . Arthritis Father   . Lung cancer Father   . Heart attack Father   . Hyperlipidemia Maternal Grandmother   . Stroke Maternal Grandmother   . Hypertension Maternal Grandmother   . Heart attack Maternal Grandmother   . Alcohol abuse Paternal Grandmother   . Arthritis Paternal Grandmother   . Mental illness Paternal Grandmother   . Breast cancer Maternal Aunt   . Epilepsy Brother     Social History   Tobacco Use  . Smoking status: Never Smoker  . Smokeless tobacco: Never Used  Substance Use Topics  . Alcohol use: Yes    Comment: occasion  . Drug use: No    Home Medications Prior to Admission medications   Medication Sig Start Date End Date Taking? Authorizing Provider  acetaminophen (TYLENOL) 650 MG CR tablet Take 1,300 mg by mouth every 8 (eight) hours as needed for pain.   Yes  [provider]  albuterol (VENTOLIN HFA) 108 (90 Base) MCG/ACT inhaler Inhale 2 puffs into the lungs every 4 (four) hours as needed for wheezing or shortness of breath. 02/16/19  Yes Doreene Nest, NP  cetirizine (ZYRTEC) 10 MG tablet Take 10 mg by mouth daily.   Yes [provider]  diltiazem (CARDIZEM CD) 180 MG 24 hr capsule Take 1 capsule (180 mg total) by mouth daily. 06/16/19 09/14/19 Yes Iran Ouch, MD  Homeopathic Products [facility] STYE EYE RELIEF OP) Place 1 drop into the left eye 2 (two) times daily.   Yes [provider]  montelukast (SINGULAIR) 10 MG tablet TAKE 1 TABLET (10 MG TOTAL) BY MOUTH  AT BEDTIME. FOR ALLERGIES/ASTHMA. 08/25/19  Yes Doreene Nest, NP  Multiple Vitamins-Minerals (WOMENS ONE DAILY PO) Take by mouth daily.    Yes [provider]  zolpidem (AMBIEN) 5 MG tablet TAKE 1 TABLET (5 MG TOTAL) BY MOUTH AT BEDTIME AS NEEDED FOR SLEEP. 06/06/19  Yes Doreene Nest, NP  ondansetron (ZOFRAN) 4 MG tablet Take 1 tablet (4 mg total) by mouth every 8 (eight) hours as needed for nausea or vomiting. 09/02/19   Atlee Villers, PA-C  predniSONE (DELTASONE) 10 MG tablet Take 6 tabs day 1, 5 tabs day 2, 4 tabs day 3, 3 tabs day 4, 2 tabs day 5, 1 tab day 6 Patient not taking: Reported on 08/16/2019 05/08/19   Lorre Munroe, NP    Allergies    Promethazine hcl and Sulfa antibiotics  Review of Systems   Review of Systems  Constitutional: Positive for fever.  Respiratory: Positive for cough.   Gastrointestinal: Positive for diarrhea, nausea and vomiting.  All other systems reviewed and are negative.   Physical Exam Updated Vital Signs BP (!) 144/84   Pulse (!) 101   Temp 98.5 F (36.9 C) (Oral)   Resp 18   LMP 03/05/2014   SpO2 97%   Physical Exam Vitals and nursing note reviewed.  Constitutional:      General: She is not in acute distress.    Appearance: She is well-developed. She is ill-appearing.     Comments: Appears ill, but nontoxic  HENT:     Head: Normocephalic and atraumatic.  Eyes:     Extraocular Movements: Extraocular movements intact.     Conjunctiva/sclera: Conjunctivae normal.     Pupils: Pupils are equal, round, and reactive to light.  Cardiovascular:     Rate and Rhythm: Regular rhythm. Tachycardia present.     Pulses: Normal pulses.     Comments: Tachycardic around 120 Pulmonary:     Effort: Pulmonary effort is normal. No respiratory distress.     Breath sounds: Normal breath sounds. No wheezing.     Comments: Clear lung sounds in all fields.  No wheezing, rales, rhonchi. Abdominal:     General: There is no distension.      Palpations: Abdomen is soft. There is no mass.     Tenderness: There is no abdominal tenderness. There is no guarding or rebound.  Musculoskeletal:        General: Normal range of motion.     Cervical back: Normal range of motion and neck supple.     Right lower leg: No edema.     Left lower leg: No edema.  Skin:    General: Skin is warm and dry.     Capillary Refill: Capillary refill takes less than 2 seconds.  Neurological:     Mental Status:  She is alert and oriented to person, place, and time.     ED Results / Procedures / Treatments   Labs (all labs ordered are listed, but only abnormal results are displayed) Labs Reviewed  CBC WITH DIFFERENTIAL/PLATELET - Abnormal; Notable for the following components:      Result Value   Platelets 147 (*)    All other components within normal limits  COMPREHENSIVE METABOLIC PANEL - Abnormal; Notable for the following components:   Potassium 3.3 (*)    Glucose, Bld 122 (*)    Calcium 8.7 (*)    AST 180 (*)    ALT 132 (*)    Alkaline Phosphatase 156 (*)    Total Bilirubin 1.6 (*)    All other components within normal limits  NOVEL CORONAVIRUS, NAA (HOSP ORDER, SEND-OUT TO REF LAB; TAT 18-24 HRS)  POC SARS CORONAVIRUS 2 AG -  ED    EKG None  Radiology No results found.  Procedures Procedures (including critical care time)  Medications Ordered in ED Medications  sodium chloride 0.9 % bolus 1,000 mL (has no administration in time range)  sodium chloride 0.9 % bolus 1,000 mL (1,000 mLs Intravenous New Bag/Given 09/02/19 2218)  ondansetron (ZOFRAN) injection 4 mg (4 mg Intravenous Given 09/02/19 2219)  acetaminophen (TYLENOL) tablet 1,000 mg (1,000 mg Oral Given 09/02/19 2219)    ED Course  I have reviewed the triage vital signs and the nursing notes.  Pertinent labs & imaging results that were available during my care of the patient were reviewed by me and considered in my medical decision making (see chart for details).     MDM Rules/Calculators/A&P                      Pt presenting for evaluation of n/v and flue like sxs. physical exam shows pt who appears ill, but nontoxic. She is tahcycardic, likely dehydrated. She does not have tremors, but still consider withdrawal. consider pt has the flu due to biphasic course of illness and sick contact. Consider covid. Will order labs due to dehydration. Will tx symptomatically with zofran, tylenol and fluids.   Labs show transaminitis.  Slight increase in bili at 1.6.  No leukocytosis.  Electrolytes otherwise reassuring.  Gust findings with patient.  Patient again maintains she is not drinking alcohol heavily. consider LFTs could be elevated due to viral illness as well.  As bili is slightly elevated and patient is having symptoms of nausea, vomiting, and dehydration, will obtain ultrasound to rule out obvious infection or concerning abnormality.  On reassessment, patient reports she is feeling better.  No further nausea or vomiting.  Heart rate is improved to low 100s.  Will give some more fluid to further improve dehydration.  Patient has tolerated some water without further vomiting.  Discussed that if patient will be discharged, she would likely benefit from Covid testing.  Discussed option of flu testing, although as patient is several days into the course of her illness, I do not believe this would be helpful for management.  Patient is agreeable to this plan.  Pt signed out to Carmie KannerK Humes, PA-C for f/u on US. If negative, pt can be d/c with covid test and symptomatic treatment.    Kelly Walton was evaluated in Emergency Department on 09/02/2019 for the symptoms described in the history of present illness. She was evaluated in the context of the global COVID-19 pandemic, which necessitated consideration that the patient might be at risk for  infection with the SARS-CoV-2 virus that causes COVID-19. Institutional protocols and algorithms that pertain to the evaluation of  patients at risk for COVID-19 are in a state of rapid change based on information released by regulatory bodies including the CDC and federal and state organizations. These policies and algorithms were followed during the patient's care in the ED.  Final Clinical Impression(s) / ED Diagnoses Final diagnoses:  Nausea & vomiting  Elevated LFTs  Dehydration    Rx / DC Orders ED Discharge Orders         Ordered    ondansetron (ZOFRAN) 4 MG tablet  Every 8 hours PRN     09/02/19 2342           Alveria Apley, PA-C 09/02/19 2354    Bethann Berkshire, MD 09/03/19 2216

## 2019-09-02 NOTE — ED Triage Notes (Signed)
She reports cough/chills x 3 days; plus nausea with occasional vomiting. She tells [place] she has close relatives "who just tested positive for the flu, but NOT for COVID". Her fiancee confided to the paramedic that pt. "drinks a lot, but she's very secretive about it and would probably leave me if she knew I told you that". He also told EMS that pt. Hasn't been able to drink for about three days because she hasn't felt well enough to. She is having rigors upon arrival to E.D. and is in no distress.

## 2019-09-03 NOTE — ED Provider Notes (Signed)
1:27 AM Patient care assumed at shift change pending abdominal US. Plan discussed with [place], PA-C which includes discharge if negative.  Imaging findings reviewed.   US Abdomen Limited RUQ  Result Date: 09/03/2019 CLINICAL DATA:  Right upper quadrant pain, nausea and vomiting for 4 days, elevated LFTs EXAM: ULTRASOUND ABDOMEN LIMITED RIGHT UPPER QUADRANT COMPARISON:  Ultrasound 07/09/2017, CT abdomen pelvis 06/10/2018 FINDINGS: Gallbladder: No gallstones or wall thickening visualized. No sonographic Murphy sign noted by sonographer. Common bile duct: Diameter: 3 mm, nondilated. Liver: No focal lesion identified. Within normal limits in parenchymal echogenicity. Portal vein is patent on color Doppler imaging with normal direction of blood flow towards the liver. Other: None. IMPRESSION: Unremarkable right upper quadrant ultrasound. Electronically Signed   By: Lovena Le M.D.   On: 09/03/2019 01:05   Plan for outpatient trending of LFTs with PCP. Patient instructed to follow up on results of COVID PCR. Return precautions discussed and provided. Patient discharged in stable condition with no unaddressed concerns.   Vitals:   09/02/19 2330 09/03/19 0000 09/03/19 0030 09/03/19 0100  BP: (!) 141/93 102/78 (!) 149/99 (!) 144/102  Pulse: 100 (!) 106 (!) 104 96  Resp: 18 18 18    Temp:      TempSrc:      SpO2: 96% 99% 98% 99%      Antonietta Breach, PA-C 57/32/20 2542    Delora Fuel, MD 70/62/37 [phone]

## 2019-09-04 LAB — NOVEL CORONAVIRUS, NAA (HOSP ORDER, SEND-OUT TO REF LAB; TAT 18-24 HRS): SARS-CoV-2, NAA: NOT DETECTED

## 2019-09-05 ENCOUNTER — Encounter: Payer: Self-pay | Admitting: Family Medicine

## 2019-09-05 ENCOUNTER — Ambulatory Visit (INDEPENDENT_AMBULATORY_CARE_PROVIDER_SITE_OTHER): Payer: 59 | Admitting: Family Medicine

## 2019-09-05 ENCOUNTER — Other Ambulatory Visit: Payer: Self-pay

## 2019-09-05 VITALS — HR 150 | Wt 120.0 lb

## 2019-09-05 DIAGNOSIS — H00015 Hordeolum externum left lower eyelid: Secondary | ICD-10-CM

## 2019-09-05 DIAGNOSIS — J4541 Moderate persistent asthma with (acute) exacerbation: Secondary | ICD-10-CM

## 2019-09-05 DIAGNOSIS — J0100 Acute maxillary sinusitis, unspecified: Secondary | ICD-10-CM | POA: Diagnosis not present

## 2019-09-05 MED ORDER — ERYTHROMYCIN 5 MG/GM OP OINT: 1.0000 "application " | TOPICAL_OINTMENT | Freq: Every day | OPHTHALMIC | 0 refills | Status: DC

## 2019-09-05 MED ORDER — PREDNISONE 20 MG PO TABS: ORAL_TABLET | ORAL | 0 refills | Status: AC

## 2019-09-05 MED ORDER — AMOXICILLIN-POT CLAVULANATE 875-125 MG PO TABS: 1.0000 | ORAL_TABLET | Freq: Two times a day (BID) | ORAL | 0 refills | Status: AC

## 2019-09-05 NOTE — Patient Instructions (Signed)
#  Stye - continue to do warm compresses - use ointment at least 1 time per day - could start with 2-3 times per day - mychart if not improved within 7-10 days and I can refer to Ophthalmology or do a trial of oral antibiotic  #Asthma - Steroids - take for 3 days - if wheezing and breathing improves, then OK to stop steroids - if not resolved/improved then continue course of steroids - use albuterol as needed - antibiotics  If no improvement in symptoms after antibiotics return for in-person visit

## 2019-09-05 NOTE — Progress Notes (Signed)
I connected with Kelly Walton on 09/05/19 at  9:00 AM EST by video and verified that I am speaking with the correct person using two identifiers.   I discussed the limitations, risks, security and privacy concerns of performing an evaluation and management service by video and the availability of in person appointments. I also discussed with the patient that there may be a patient responsible charge related to this service. The patient expressed understanding and agreed to proceed.  Patient location: Home Provider Location: [place] Participants: Lesleigh Noe and [name]   Subjective:     Kelly Walton is a 48 y.o. female presenting for Cough (wheezing, runny nose, chills and sweats. sx started around 12/23) and Stye (left eye)     Cough This is a new problem. The current episode started in the past 7 days. The problem has been gradually worsening. The cough is productive of sputum. Associated symptoms include chills, headaches, nasal congestion, rhinorrhea, sweats and wheezing. Pertinent negatives include no fever or myalgias. The symptoms are aggravated by lying down. She has tried a beta-agonist inhaler for the symptoms. The treatment provided mild relief. Her past medical history is significant for asthma.   Albuterol 3-4 times a day  Sick contact: mom with the flu, negative for covid - lives in the house Pt got a flu shot this year Works at [employer] Already negative for [employer] it headed to the chest on Sunday afternoon Is doing better with eating/drinking  In the past - got prednisone and would have improvement  #Stye left eye  - painful. Has been doing warm compresses several times a day w/o improvement.   Review of Systems  Constitutional: Positive for chills. Negative for fever.  HENT: Positive for rhinorrhea.   Respiratory: Positive for cough and wheezing.   Musculoskeletal: Negative for myalgias.  Neurological: Positive for headaches.      Social History   Tobacco Use  Smoking Status Never Smoker  Smokeless Tobacco Never Used        Objective:   BP Readings from Last 3 Encounters:  09/03/19 (!) 145/92  08/16/19 110/80  05/11/19 (!) 142/92   Wt Readings from Last 3 Encounters:  09/05/19 120 lb (54.4 kg)  08/16/19 114 lb 4 oz (51.8 kg)  05/11/19 117 lb (53.1 kg)    Pulse (!) 150 Comment: per patient per her smart watch  Wt 120 lb (54.4 kg) Comment: per patient  LMP 03/05/2014   BMI 19.37 kg/m    Physical Exam Constitutional:      Appearance: Normal appearance. She is not ill-appearing.  HENT:     Head: Normocephalic and atraumatic.     Right Ear: External ear normal.     Left Ear: External ear normal.     Nose: Congestion and rhinorrhea present.  Eyes:     General: No scleral icterus.       Left eye: Hordeolum (lower lid) present.    Conjunctiva/sclera: Conjunctivae normal.  Pulmonary:     Effort: Pulmonary effort is normal. No respiratory distress.  Neurological:     Mental Status: She is alert. Mental status is at baseline.  Psychiatric:        Mood and Affect: Mood normal.        Behavior: Behavior normal.        Thought Content: Thought content normal.        Judgment: Judgment normal.  Assessment & Plan:   Problem List Items Addressed This Visit    None    Visit Diagnoses    Acute non-recurrent maxillary sinusitis    -  Primary   Relevant Medications   amoxicillin-clavulanate (AUGMENTIN) 875-125 MG tablet   predniSONE (DELTASONE) 20 MG tablet   Moderate persistent asthma with exacerbation       Relevant Medications   predniSONE (DELTASONE) 20 MG tablet   Hordeolum externum of left lower eyelid       Relevant Medications   erythromycin ophthalmic ointment     Given hx of asthma and negative covid will treat for exacerbation as well as possible sinus infection (congestion/headache).   Ointment for stye to see if that helps it resolve   Return if symptoms  worsen or fail to improve.  Lynnda Child, MD

## 2019-09-15 DIAGNOSIS — G4700 Insomnia, unspecified: Secondary | ICD-10-CM

## 2019-09-16 ENCOUNTER — Other Ambulatory Visit: Payer: Self-pay | Admitting: Family Medicine

## 2019-09-16 ENCOUNTER — Other Ambulatory Visit: Payer: Self-pay | Admitting: Primary Care

## 2019-09-16 DIAGNOSIS — G4700 Insomnia, unspecified: Secondary | ICD-10-CM

## 2019-09-16 MED ORDER — ZOLPIDEM TARTRATE 5 MG PO TABS: 5.0000 mg | ORAL_TABLET | Freq: Every evening | ORAL | 0 refills | Status: DC | PRN

## 2019-09-16 NOTE — Telephone Encounter (Signed)
Last written 06-06-19 to be filled 06-21-19 #90 Last OV with Jae Dire 04-03-19 (2 Acute visits since) No Future OV [facility]

## 2019-09-26 ENCOUNTER — Other Ambulatory Visit: Payer: Self-pay

## 2019-09-26 ENCOUNTER — Ambulatory Visit (INDEPENDENT_AMBULATORY_CARE_PROVIDER_SITE_OTHER)
Admission: RE | Admit: 2019-09-26 | Discharge: 2019-09-26 | Disposition: A | Discharge status: Home / Self Care | Payer: 59 | Source: Ambulatory Visit | Attending: Family Medicine | Admitting: Family Medicine

## 2019-09-26 ENCOUNTER — Ambulatory Visit (INDEPENDENT_AMBULATORY_CARE_PROVIDER_SITE_OTHER): Payer: 59 | Admitting: Family Medicine

## 2019-09-26 ENCOUNTER — Encounter: Payer: Self-pay | Admitting: Family Medicine

## 2019-09-26 DIAGNOSIS — S6991XA Unspecified injury of right wrist, hand and finger(s), initial encounter: Secondary | ICD-10-CM | POA: Diagnosis not present

## 2019-09-26 HISTORY — DX: Unspecified injury of right wrist, hand and finger(s), initial encounter: S69.91XA

## 2019-09-26 MED ORDER — TRAMADOL HCL 50 MG PO TABS: 50.0000 mg | ORAL_TABLET | Freq: Three times a day (TID) | ORAL | 0 refills | Status: AC | PRN

## 2019-09-26 MED ORDER — MELOXICAM 15 MG PO TABS: 15.0000 mg | ORAL_TABLET | Freq: Every day | ORAL | 0 refills | Status: DC | PRN

## 2019-09-26 NOTE — Patient Instructions (Signed)
Rest and elevate and ice hand  Compression bandage if helpful   meloxicam for pain and inflammation  Tramadol for pain with caution of sedation   I placed an orthopedic referral -our office will call you to arrange that   If symptoms change please let us know

## 2019-09-26 NOTE — Assessment & Plan Note (Signed)
Moderate to severe pain and tenderness over 5th metacarpal and proximal phalanx  Xray-no acute fx or dislocation  Disc compression bandage, rest, elevation and ice  meloxicam daily with food  Tramadol prn -caution of sedation  Placed ref to orthopedics for further eval given severity of pain
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
## 2019-09-27 ENCOUNTER — Telehealth: Payer: Self-pay

## 2019-09-27 ENCOUNTER — Ambulatory Visit: Payer: 59 | Admitting: Primary Care

## 2019-09-27 ENCOUNTER — Encounter: Payer: 59 | Admitting: Primary Care

## 2019-09-27 NOTE — Telephone Encounter (Signed)
Lvm to reschedule appt

## 2019-09-27 NOTE — Telephone Encounter (Signed)
[facility] Night - Client Nonclinical Telephone Record AccessNurse Client [facility] Night - Client Client Site [facility] - Night Physician Vernona Rieger - NP Contact Type Call Who Is Calling Patient / Member / Family / Caregiver Caller Name Josephine Wooldridge Phone Number [phone] Patient Name Kelly Walton Patient DOB [birth year] Call Type Message Only Information Provided Reason for Call Request to Reschedule Office Appointment Initial Comment Caller states she would like to reschedule her appointment due to work. Additional Comment Disp. Time Disposition Final User 09/27/2019 7:39:20 AM General Information Provided Yes Marlow Baars, Tyrechia Call Closed By: Ellis Parents Transaction Date/Time: 09/27/2019 7:37:28 AM (ET)

## 2019-10-06 ENCOUNTER — Ambulatory Visit: Payer: No Typology Code available for payment source | Admitting: Primary Care

## 2019-10-06 ENCOUNTER — Other Ambulatory Visit: Payer: Self-pay | Admitting: Family Medicine

## 2019-10-10 ENCOUNTER — Encounter: Payer: Self-pay | Admitting: Primary Care

## 2019-10-10 ENCOUNTER — Ambulatory Visit (INDEPENDENT_AMBULATORY_CARE_PROVIDER_SITE_OTHER): Payer: 59 | Admitting: Primary Care

## 2019-10-10 ENCOUNTER — Other Ambulatory Visit: Payer: Self-pay

## 2019-10-10 VITALS — BP 118/80 | HR 98 | Temp 96.9°F | Ht 66.0 in | Wt 114.5 lb

## 2019-10-10 DIAGNOSIS — R7989 Other specified abnormal findings of blood chemistry: Secondary | ICD-10-CM

## 2019-10-10 DIAGNOSIS — Z1231 Encounter for screening mammogram for malignant neoplasm of breast: Secondary | ICD-10-CM | POA: Diagnosis not present

## 2019-10-10 DIAGNOSIS — Z0000 Encounter for general adult medical examination without abnormal findings: Secondary | ICD-10-CM | POA: Diagnosis not present

## 2019-10-10 DIAGNOSIS — J4520 Mild intermittent asthma, uncomplicated: Secondary | ICD-10-CM | POA: Diagnosis not present

## 2019-10-10 DIAGNOSIS — G4700 Insomnia, unspecified: Secondary | ICD-10-CM

## 2019-10-10 DIAGNOSIS — I10 Essential (primary) hypertension: Secondary | ICD-10-CM | POA: Diagnosis not present

## 2019-10-10 DIAGNOSIS — J302 Other seasonal allergic rhinitis: Secondary | ICD-10-CM

## 2019-10-10 DIAGNOSIS — R002 Palpitations: Secondary | ICD-10-CM

## 2019-10-10 DIAGNOSIS — E785 Hyperlipidemia, unspecified: Secondary | ICD-10-CM

## 2019-10-10 LAB — COMPREHENSIVE METABOLIC PANEL
ALT: 68 U/L — ABNORMAL HIGH (ref 0–35)
AST: 126 U/L — ABNORMAL HIGH (ref 0–37)
Albumin: 4 g/dL (ref 3.5–5.2)
Alkaline Phosphatase: 103 U/L (ref 39–117)
BUN: 8 mg/dL (ref 6–23)
CO2: 29 mEq/L (ref 19–32)
Calcium: 9.2 mg/dL (ref 8.4–10.5)
Chloride: 105 mEq/L (ref 96–112)
Creatinine, Ser: 0.77 mg/dL (ref 0.40–1.20)
GFR: 80.15 mL/min (ref >60.00)
Glucose, Bld: 89 mg/dL (ref 70–99)
Potassium: 3.7 mEq/L (ref 3.5–5.1)
Sodium: 143 mEq/L (ref 135–145)
Total Bilirubin: 0.3 mg/dL (ref 0.2–1.2)
Total Protein: 7 g/dL (ref 6.0–8.3)

## 2019-10-10 LAB — LIPID PANEL
Cholesterol: 240 mg/dL — ABNORMAL HIGH (ref 0–200)
HDL: 99.1 mg/dL (ref >39.00)
NonHDL: 141.12
Total CHOL/HDL Ratio: 2
Triglycerides: 209 mg/dL — ABNORMAL HIGH (ref 0.0–149.0)
VLDL: 41.8 mg/dL — ABNORMAL HIGH (ref 0.0–40.0)

## 2019-10-10 LAB — LDL CHOLESTEROL, DIRECT: Direct LDL: 103 mg/dL

## 2019-10-10 MED ORDER — TRAZODONE HCL 50 MG PO TABS: ORAL_TABLET | ORAL | 0 refills | Status: DC

## 2019-10-10 NOTE — Patient Instructions (Addendum)
Stop by the lab prior to leaving today. I will notify you of your results once received.   [facility] to schedule your mamm[facility].  Call your insurance company regarding coverage for colonoscopy. Please update me.  Stop zolpidem. Start Trazodone 50 mg. Take 1-2 tablets by mouth 30 minutes prior to sleep. Please update me.  Continue to work on a healthy diet, get regular exercise.  It was a pleasure to see you today!   Preventive Care 48-20 Years Old, Female Preventive care refers to visits with your health care provider and lifestyle choices that can promote health and wellness. This includes:  A yearly physical exam. This may also be called an annual well check.  Regular dental visits and eye exams.  Immunizations.  Screening for certain conditions.  Healthy lifestyle choices, such as eating a healthy diet, getting regular exercise, not using drugs or products that contain nicotine and tobacco, and limiting alcohol use. What can I expect for my preventive care visit? Physical exam Your health care provider will check your:  Height and weight. This may be used to calculate body mass index (BMI), which tells if you are at a healthy weight.  Heart rate and blood pressure.  Skin for abnormal spots. Counseling Your health care provider may ask you questions about your:  Alcohol, tobacco, and drug use.  Emotional well-being.  Home and relationship well-being.  Sexual activity.  Eating habits.  Work and work Statistician.  Method of birth control.  Menstrual cycle.  Pregnancy history. What immunizations do I need?  Influenza (flu) vaccine  This is recommended every year. Tetanus, diphtheria, and pertussis (Tdap) vaccine  You may need a Td booster every 10 years. Varicella (chickenpox) vaccine  You may need this if you have not been vaccinated. Zoster (shingles) vaccine  You may need this after age 59. Measles, mumps, and rubella (MMR)  vaccine  You may need at least one dose of MMR if you were born in 1957 or later. You may also need a second dose. Pneumococcal conjugate (PCV13) vaccine  You may need this if you have certain conditions and were not previously vaccinated. Pneumococcal polysaccharide (PPSV23) vaccine  You may need one or two doses if you smoke cigarettes or if you have certain conditions. Meningococcal conjugate (MenACWY) vaccine  You may need this if you have certain conditions. Hepatitis A vaccine  You may need this if you have certain conditions or if you travel or work in places where you may be exposed to hepatitis A. Hepatitis B vaccine  You may need this if you have certain conditions or if you travel or work in places where you may be exposed to hepatitis B. Haemophilus influenzae type b (Hib) vaccine  You may need this if you have certain conditions. Human papillomavirus (HPV) vaccine  If recommended by your health care provider, you may need three doses over 6 months. You may receive vaccines as individual doses or as more than one vaccine together in one shot (combination vaccines). Talk with your health care provider about the risks and benefits of combination vaccines. What tests do I need? Blood tests  Lipid and cholesterol levels. These may be checked every 5 years, or more frequently if you are over 15 years old.  Hepatitis C test.  Hepatitis B test. Screening  Lung cancer screening. You may have this screening every year starting at age 70 if you have a 30-pack-year history of smoking and currently smoke or have quit within  the past 15 years.  Colorectal cancer screening. All adults should have this screening starting at age 26 and continuing until age 35. Your health care provider may recommend screening at age 50 if you are at increased risk. You will have tests every 1-10 years, depending on your results and the type of screening test.  Diabetes screening. This is done by  checking your blood sugar (glucose) after you have not eaten for a while (fasting). You may have this done every 1-3 years.  Mammogram. This may be done every 1-2 years. Talk with your health care provider about when you should start having regular mammograms. This may depend on whether you have a family history of breast cancer.  BRCA-related cancer screening. This may be done if you have a family history of breast, ovarian, tubal, or peritoneal cancers.  Pelvic exam and Pap test. This may be done every 3 years starting at age 6. Starting at age 61, this may be done every 5 years if you have a Pap test in combination with an HPV test. Other tests  Sexually transmitted disease (STD) testing.  Bone density scan. This is done to screen for osteoporosis. You may have this scan if you are at high risk for osteoporosis. Follow these instructions at home: Eating and drinking  Eat a diet that includes fresh fruits and vegetables, whole grains, lean protein, and low-fat dairy.  Take vitamin and mineral supplements as recommended by your health care provider.  Do not drink alcohol if: ? Your health care provider tells you not to drink. ? You are pregnant, may be pregnant, or are planning to become pregnant.  If you drink alcohol: ? Limit how much you have to 0-1 drink a day. ? Be aware of how much alcohol is in your drink. In the [place] one drink equals one 12 oz bottle of beer (355 mL), one 5 oz glass of wine (148 mL), or one 1 oz glass of hard liquor (44 mL). Lifestyle  Take daily care of your teeth and gums.  Stay active. Exercise for at least 30 minutes on 5 or more days each week.  Do not use any products that contain nicotine or tobacco, such as cigarettes, e-cigarettes, and chewing tobacco. If you need help quitting, ask your health care provider.  If you are sexually active, practice safe sex. Use a condom or other form of birth control (contraception) in order to prevent pregnancy  and STIs (sexually transmitted infections).  If told by your health care provider, take low-dose aspirin daily starting at age 31. What's next?  Visit your health care provider once a year for a well check visit.  Ask your health care provider how often you should have your eyes and teeth checked.  Stay up to date on all vaccines. This information is not intended to replace advice given to you by your health care provider. Make sure you discuss any questions you have with your health care provider. Document Revised: 05/05/2018 Document Reviewed: 05/05/2018 Elsevier Patient Education  2020 [employer].

## 2019-10-10 NOTE — Assessment & Plan Note (Signed)
Zolpidem ineffective for maintenance insomnia as she's waking a 3 am nearly every morning. Will stop zolpidem, trial Trazodone. She will update in a week or so.

## 2019-10-10 NOTE — Progress Notes (Signed)
Subjective:    Patient ID: Kelly Walton, female    DOB: Dec 09, 1970, 49 y.o.   MRN: [id number]  HPI  This visit occurred during the SARS-CoV-2 public health emergency.  Safety protocols were in place, including screening questions prior to the visit, additional usage of staff PPE, and extensive cleaning of exam room while observing appropriate contact time as indicated for disinfecting solutions.   Kelly Walton is a 49 year old female who presents today for complete physical.  She would also like to discuss difficulty sleeping. She is currently managed on Zolpidem 5 mg for which she has taken for years. The zolpidem helps her to fall asleep but will wake up around 3 am nearly every morning. She will go to sleep around 9:30 pm, waking up at 3 am, mind will race. Most of the time she doesn't fall back asleep, feels tired throughout the day. She's tried melatonin in the past which was ineffective, otherwise she's not tried anything else Rx. She denies daily anxiety.   Immunizations: -Tetanus: Completed in 2016 -Influenza: Completed this season  Diet: She endorses a healthy diet. Exercise: She has been exercising at the gym.  Eye exam: Completed in 2020, due in April 2021 Dental exam: Completed in 2020  Pap Smear: Hysterectomy  Mammogram: Completed in 2018 Colonoscopy: Never completed  BP Readings from Last 3 Encounters:  10/10/19 118/80  09/26/19 (!) 148/66  09/03/19 (!) 145/92   Wt Readings from Last 3 Encounters:  10/10/19 114 lb 8 oz (51.9 kg)  09/26/19 115 lb 5 oz (52.3 kg)  09/05/19 120 lb (54.4 kg)     Review of Systems  Constitutional: Negative for unexpected weight change.  HENT: Negative for rhinorrhea.   Respiratory: Negative for cough and shortness of breath.   Cardiovascular: Negative for chest pain.  Gastrointestinal: Negative for constipation and diarrhea.  Genitourinary: Negative for difficulty urinating and menstrual problem.  Musculoskeletal: Negative for  arthralgias.  Skin: Negative for rash.  Allergic/Immunologic: Negative for environmental allergies.  Neurological: Negative for dizziness, numbness and headaches.  Psychiatric/Behavioral: The patient is not nervous/anxious.        Past Medical History:  Diagnosis Date  . Acute right lower quadrant pain 06/10/2018  . Alcohol abuse   . Asthma   . Hypertension   . Insomnia   . Pain of upper abdomen 06/21/2017  . Palpitations    a. 05/2019 Echo: EF 60-65%, diast dysfxn; b. 05/2019 Zio: Sinus rhythm-sinus tach. Avg HR 105. 6 SVT episodes - longest 7 beats. Rare PVCs-->Diltiazem added.  Marland Kitchen Spongiotic dermatitis      Social History   Socioeconomic History  . Marital status: Single    Spouse name: Not on file  . Number of children: Not on file  . Years of education: Not on file  . Highest education level: Not on file  Occupational History  . Not on file  Tobacco Use  . Smoking status: Never Smoker  . Smokeless tobacco: Never Used  Substance and Sexual Activity  . Alcohol use: Yes    Comment: occasion  . Drug use: No  . Sexual activity: Not on file  Other Topics Concern  . Not on file  Social History Narrative   Single.   2 children, 1 step-son.   Manager at [employer].   Enjoys going to [employer], spending time with family.    Social Determinants of Health   Financial Resource Strain:   . Difficulty of Paying Living Expenses: Not on file  Food Insecurity:   . Worried About Programme researcher, broadcasting/film/video in the Last Year: Not on file  . Ran Out of Food in the Last Year: Not on file  Transportation Needs:   . Lack of Transportation (Medical): Not on file  . Lack of Transportation (Non-Medical): Not on file  Physical Activity:   . Days of Exercise per Week: Not on file  . Minutes of Exercise per Session: Not on file  Stress:   . Feeling of Stress : Not on file  Social Connections:   . Frequency of Communication with Friends and Family: Not on file  . Frequency of Social Gatherings with  Friends and Family: Not on file  . Attends Religious Services: Not on file  . Active Member of Clubs or Organizations: Not on file  . Attends Banker Meetings: Not on file  . Marital Status: Not on file  Intimate Partner Violence:   . Fear of Current or Ex-Partner: Not on file  . Emotionally Abused: Not on file  . Physically Abused: Not on file  . Sexually Abused: Not on file    Past Surgical History:  Procedure Laterality Date  . CESAREAN SECTION    . FRACTURE SURGERY    . ROBOTIC ASSISTED TOTAL HYSTERECTOMY N/A 03/05/2014   Procedure: ROBOTIC ASSISTED TOTAL HYSTERECTOMY/BILATERAL SALPINGECTOMY;  Surgeon: Serita Kyle, MD;  Location: [facility];  Service: Gynecology;  Laterality: N/A;  . TUBAL LIGATION      Family History  Problem Relation Age of Onset  . Arthritis Mother   . Arthritis Father   . Lung cancer Father   . Heart attack Father   . Hyperlipidemia Maternal Grandmother   . Stroke Maternal Grandmother   . Hypertension Maternal Grandmother   . Heart attack Maternal Grandmother   . Alcohol abuse Paternal Grandmother   . Arthritis Paternal Grandmother   . Mental illness Paternal Grandmother   . Breast cancer Maternal Aunt   . Epilepsy Brother     Allergies  Allergen Reactions  . Promethazine Hcl Hives and Rash  . Sulfa Antibiotics Hives and Rash    Pt states sent her to the hospital.    Current Outpatient Medications on File Prior to Visit  Medication Sig Dispense Refill  . albuterol (VENTOLIN HFA) 108 (90 Base) MCG/ACT inhaler Inhale 2 puffs into the lungs every 4 (four) hours as needed for wheezing or shortness of breath. 1 Inhaler 0  . cetirizine (ZYRTEC) 10 MG tablet Take 10 mg by mouth daily.    Marland Kitchen erythromycin ophthalmic ointment Place 1 application into the left eye at bedtime. 3.5 g 0  . Homeopathic Products (SIMILASAN STYE EYE RELIEF OP) Place 1 drop into the left eye 2 (two) times daily.    . montelukast (SINGULAIR) 10 MG tablet TAKE 1  TABLET (10 MG TOTAL) BY MOUTH AT BEDTIME. FOR ALLERGIES/ASTHMA. 90 tablet 3  . Multiple Vitamins-Minerals (WOMENS ONE DAILY PO) Take by mouth daily.     Marland Kitchen tiZANidine (ZANAFLEX) 2 MG tablet TAKE 1-2 TABLETS (2-4 MG TOTAL) BY MOUTH EVERY 6 (SIX) HOURS AS NEEDED FOR MUSCLE SPASMS. 60 tablet 1  . zolpidem (AMBIEN) 5 MG tablet Take 1 tablet (5 mg total) by mouth at bedtime as needed for sleep. 90 tablet 0  . diltiazem (CARDIZEM CD) 180 MG 24 hr capsule Take 1 capsule (180 mg total) by mouth daily. 90 capsule 1   No current facility-administered medications on file prior to visit.    BP 118/80  Pulse 98   Temp (!) 96.9 F (36.1 C) (Temporal)   Ht 5\' 6"  (1.676 m)   Wt 114 lb 8 oz (51.9 kg)   LMP 03/05/2014   SpO2 97%   BMI 18.48 kg/m    Objective:   Physical Exam  Constitutional: She is oriented to person, place, and time. She appears well-nourished.  HENT:  Right Ear: Tympanic membrane and ear canal normal.  Left Ear: Tympanic membrane and ear canal normal.  Mouth/Throat: Oropharynx is clear and moist.  Eyes: Pupils are equal, round, and reactive to light. EOM are normal.  Cardiovascular: Normal rate and regular rhythm.  Respiratory: Effort normal and breath sounds normal.  GI: Soft. Bowel sounds are normal. There is no abdominal tenderness.  Musculoskeletal:        General: Normal range of motion.     Cervical back: Neck supple.  Neurological: She is alert and oriented to person, place, and time. No cranial nerve deficit.  Reflex Scores:      Patellar reflexes are 2+ on the right side and 2+ on the left side. Skin: Skin is warm and dry.  Psychiatric: She has a normal mood and affect.           Assessment & Plan:

## 2019-10-10 NOTE — Assessment & Plan Note (Signed)
Doing well on current regimen, continue same. 

## 2019-10-10 NOTE — Assessment & Plan Note (Signed)
Lipid panel pending.

## 2019-10-10 NOTE — Assessment & Plan Note (Signed)
Improved with diltiazem.  She did meet with cardiology, wore 30 day Holter Monitor which was negative for arrhythmia. Continue diltiazem.

## 2019-10-10 NOTE — Assessment & Plan Note (Signed)
Infrequent use of albuterol, overall doing well. No wheezing on exam.

## 2019-10-10 NOTE — Assessment & Plan Note (Signed)
Stable in the office today, compliant to diltiazem. Following with cardiology. Continue current regimen.

## 2019-10-10 NOTE — Assessment & Plan Note (Signed)
Repeat CMP pending. 

## 2019-10-10 NOTE — Addendum Note (Signed)
Addended by: Doreene Nest on: 10/10/2019 09:56 AM   Modules accepted: Orders

## 2019-10-10 NOTE — Assessment & Plan Note (Signed)
Immunizations UTD. Mammogram due, orders placed. She will check on insurance coverage for the colonoscopy. Encouraged a healthy diet, regular exercise. Exam today stable. Labs pending.

## 2019-10-18 ENCOUNTER — Other Ambulatory Visit: Payer: Self-pay | Admitting: Primary Care

## 2019-10-18 DIAGNOSIS — G4700 Insomnia, unspecified: Secondary | ICD-10-CM

## 2019-10-30 ENCOUNTER — Telehealth: Payer: Self-pay

## 2019-10-30 NOTE — Telephone Encounter (Signed)
Patient's fiance, [name] (ok per DPR on file), called to give update on patient. [name] states he is not sure if patient has been honest with Jae Dire. Patient has been depressed, very emotional, crying just staring at the ceiling the past 2 nights. Her sleeping medication was changed recently and these symptom seem to have gotten a little worse since then but she has been struggling with this even before the change. Patient will tell [name] that no one cares, that he does not care about her. Patient drinks a lot-drinks about 10 to 12 shots of straight alchol with a splash of coke every day. She comes after work and stays in her room from 5 pm to 8 pm. Patient is up at night Patient also took 2 shots the other day before going to work. [name] asks patient about her liver tests and patient tells him they are fine no problems. [name] feels like that is a lie. Patient has been drinking like this for 10 years. Patient is nasty and hateful to him and others. Patient substitutes alcohol for food. [name] has tried talking to patient's mom and brother to help do something about all of this but they do not want to approach patient about this because they do not want to piss her off. Patient's mom just moved in with patient and Davd recently and her mom thinks the patient is drinking and acting like this because she is there and so they are both emotional and [name] is having hard time dealing with all of this. He is concerned that patient will loose her job or get in an accident from her drinking. [name] states something bad will happen. [name] states patient can not know that he called because it will not be good for him how the patient will re act. He asks for Jae Dire to help and maybe get labs done to chek alchol level but without letting on that [name] called. He does state that patient has admitted to him that she has alchol issues but she will not go on her own to get help. He said if Jae Dire wanted to speak to him more his number is  [phone].

## 2019-10-31 NOTE — Telephone Encounter (Signed)
Noted and appreciate the detail. I attempted to call [name], no answer, voicemail left to return my call.

## 2019-11-01 NOTE — Telephone Encounter (Signed)
Spoke with [name], patient's fiance and he confirmed everything mentioned in the phone note below.  He adds that she's been taking Trazodone (and Ambien in the past) along with significant alcohol consumption which is dangerous. She is also taking shots prior to driving to work, [name] can smell the alcohol on her breath in the morning. She clearly needs to enter a professional detox program where she is monitored, especially given her long history of abuse and significant amount of intake.   Johny Drilling, please call patient and have her scheduled either tomorrow (02/25) at 12 pm or early next week to follow up on sleep and repeat liver labs. Do not mention that I've spoke with her fiance regarding her alcohol intake, she is not aware that I know about this.

## 2019-11-01 NOTE — Telephone Encounter (Signed)
Message left for patient to return my call.  Need to schedule a follow up appointment for sleep and repeat liver labs

## 2019-11-01 NOTE — Addendum Note (Signed)
Addended by: Doreene Nest on: 11/01/2019 01:11 PM   Modules accepted: Orders

## 2019-11-02 NOTE — Telephone Encounter (Signed)
Kelly Walton, I really need to see her in person, not virtually. When she calls have her screened and if we need to wait until late next week that's fine. See Kelly Walton regarding this, she'll fill you in.

## 2019-11-02 NOTE — Telephone Encounter (Signed)
Patient sent a message through MyChart regarding requested appointment to [name]

## 2019-11-03 ENCOUNTER — Ambulatory Visit: Payer: 59 | Admitting: Family Medicine

## 2019-11-03 ENCOUNTER — Encounter: Payer: Self-pay | Admitting: Family Medicine

## 2019-11-03 ENCOUNTER — Telehealth: Payer: Self-pay | Admitting: Radiology

## 2019-11-03 NOTE — Telephone Encounter (Signed)
Cancelled appt due to symptoms of COVID, she has contacted her PCP and she is going to get a test done today.  She will call back to rescheduled once she os negative or after she is better.

## 2019-11-03 NOTE — Telephone Encounter (Signed)
Pt c/o cough, HA, fatigue, nausea and vomiting and itchy eyes for 2 days. Pt reports she was able to hold down some broth today but that is all. Pt is afebrile and denies any other covid symptoms. Pt reports she works at [employer]. Advised pt should be tested for covid. She said she is going to try to get a rapid test at an UC. If not [facility]. She is to return to work 3/2 and 3/3 and would like results back by then. Advised PCP would like to see her in the office to have labs drawn also but if symptoms persist to contact this office. Advised if symptoms worsen to go to the UC or ER. Scheduled pt for 3/4 due to her work schedule. Pt verbalized understanding.

## 2019-11-04 NOTE — Telephone Encounter (Signed)
Agree with advice

## 2019-11-07 ENCOUNTER — Other Ambulatory Visit: Payer: Self-pay

## 2019-11-07 ENCOUNTER — Ambulatory Visit: Payer: 59 | Admitting: Family Medicine

## 2019-11-07 NOTE — Telephone Encounter (Signed)
Noted, will see her on 03/04.

## 2019-11-09 ENCOUNTER — Other Ambulatory Visit: Payer: Self-pay

## 2019-11-09 ENCOUNTER — Encounter: Payer: Self-pay | Admitting: Primary Care

## 2019-11-09 ENCOUNTER — Ambulatory Visit (INDEPENDENT_AMBULATORY_CARE_PROVIDER_SITE_OTHER): Payer: 59 | Admitting: Primary Care

## 2019-11-09 VITALS — BP 126/80 | HR 115 | Temp 96.8°F | Ht 66.0 in | Wt 111.5 lb

## 2019-11-09 DIAGNOSIS — R7989 Other specified abnormal findings of blood chemistry: Secondary | ICD-10-CM | POA: Diagnosis not present

## 2019-11-09 DIAGNOSIS — G4700 Insomnia, unspecified: Secondary | ICD-10-CM | POA: Diagnosis not present

## 2019-11-09 LAB — HEPATIC FUNCTION PANEL
ALT: 100 U/L — ABNORMAL HIGH (ref 0–35)
AST: 202 U/L — ABNORMAL HIGH (ref 0–37)
Albumin: 4.2 g/dL (ref 3.5–5.2)
Alkaline Phosphatase: 114 U/L (ref 39–117)
Bilirubin, Direct: 0.2 mg/dL (ref 0.0–0.3)
Total Bilirubin: 0.6 mg/dL (ref 0.2–1.2)
Total Protein: 7.2 g/dL (ref 6.0–8.3)

## 2019-11-09 LAB — CBC
HCT: 47.2 % — ABNORMAL HIGH (ref 36.0–46.0)
Hemoglobin: 15.8 g/dL — ABNORMAL HIGH (ref 12.0–15.0)
MCHC: 33.4 g/dL (ref 30.0–36.0)
MCV: 97.5 fl (ref 78.0–100.0)
Platelets: 248 10*3/uL (ref 150.0–400.0)
RBC: 4.84 Mil/uL (ref 3.87–5.11)
RDW: 14.4 % (ref 11.5–15.5)
WBC: 5.4 10*3/uL (ref 4.0–10.5)

## 2019-11-09 NOTE — Assessment & Plan Note (Addendum)
Chronic since 2015 based off of chart review, significantly elevated since early 2020.  Recently notified by her fiance regarding her severe alcohol abuse, 10-12 shots of liquor daily. She denies alcohol abuse, endorses very little consumption.  I did not mention today that her fiance notified me of her alcohol abuse. I did discuss the potential effects alcohol abuse will have on the liver. She continues to deny alcohol consumption.   Exam today without hepatomegaly. She does not appear jaundiced.  She is tachycardic, BP stable.  Ultrasound of liver in December 2020 unremarkable which is reassuring.  Checking labs today including repeat hepatic function, adding Hepatitis panel and CBC.   Supervising physician consulted regarding situation. We agreed that the family really needs to get involved as they witness the behavior. She is clearly not admitting to the alcohol abuse with [place].   Await labs.

## 2019-11-09 NOTE — Patient Instructions (Signed)
Stop by the lab prior to leaving today. I will notify you of your results once received.   It was a pleasure to see you today!  

## 2019-11-09 NOTE — Progress Notes (Signed)
Subjective:    Patient ID: Kelly Walton, female    DOB: Nov 15, 1970, 49 y.o.   MRN: [id number]  HPI  This visit occurred during the SARS-CoV-2 public health emergency.  Safety protocols were in place, including screening questions prior to the visit, additional usage of staff PPE, and extensive cleaning of exam room while observing appropriate contact time as indicated for disinfecting solutions.   Kelly Walton is a 49 year old female with a history of elevated liver enzymes, severe alcohol abuse (notified by her husband), asthma, hypertension who presents today to discuss liver enzyme elevations and follow up on sleep.  1) Elevated LFT's: Chronic elevated liver enzymes that date back to 2017. Liver enzymes since December 2020 with all enzymes elevated, some three times the upper limits of normal. She is due for repeat liver enzyme testing today. She underwent an ultrasound of her liver in December 2020 which was unremarkable.   During prior visits she never endorsed a history of alcohol consumption or abuse. Her fiance (on [facility]) called our office last week reporting a long history of alcohol abuse for the last 10 years. He endorses that her alcohol abuse has progressed to where she is taking 10-12 shots of alcohol daily with a splash of coke. He also endorsed that she is eating very little, having difficulty with sleeping, is emotional and lashing out. She is missing work often and her fiance was concerned that she may lose her job. When he approaches her regarding her abuse she denies she has a problem and will lash out. He has addressed this problem with her family who doesn't want to approach the patient as they are afraid she will become upset. Her fiance asked [place] not to mention that he called as he is afraid of how she will react.   Today she denies regular alcohol consumption, endorses drinking wine on occasion during the weekends when she and her husband go out. This is not often.   Last week  she experienced a 3-4 day history of chills, headache, feeling tired, nausea with vomiting and could hold down liquids. Since then she's been doing much better and eating a normal diet. She actually endorses that she eats lunch and dinner daily, smaller portions.   She denies unexplained weight loss, she does notice night sweats, color changes in skin.  2) Insomnia: Chronic for years, difficulty falling and staying asleep for years. Once on Ambien for years and did well up until a few months ago when she started waking at 3 am nightly. During her visit one month ago we stopped Ambien and initiated Trazodone.   Since her last visit she cannot tolerate Trazodone, this caused her to hallucinate. The Trazodone was initiated prior to learning about her severe alcohol abuse.   She has tried Melatonin, benadryl, Unisom in the past without improvement.   Wt Readings from Last 3 Encounters:  11/09/19 111 lb 8 oz (50.6 kg)  10/10/19 114 lb 8 oz (51.9 kg)  09/26/19 115 lb 5 oz (52.3 kg)     Review of Systems  Constitutional: Negative for fatigue and unexpected weight change.       Night sweats  Eyes: Negative for visual disturbance.  Respiratory: Negative for shortness of breath.   Cardiovascular: Negative for chest pain and palpitations.  Gastrointestinal: Negative for abdominal pain, nausea and vomiting.  Skin: Negative for color change.  Neurological: Negative for headaches.       Past Medical History:  Diagnosis Date  .  Acute right lower quadrant pain 06/10/2018  . Alcohol abuse   . Asthma   . Hypertension   . Insomnia   . Pain of upper abdomen 06/21/2017  . Palpitations    a. 05/2019 Echo: EF 60-65%, diast dysfxn; b. 05/2019 Zio: Sinus rhythm-sinus tach. Avg HR 105. 6 SVT episodes - longest 7 beats. Rare PVCs-->Diltiazem added.  Marland Kitchen Spongiotic dermatitis      Social History   Socioeconomic History  . Marital status: Single    Spouse name: Not on file  . Number of children: Not on  file  . Years of education: Not on file  . Highest education level: Not on file  Occupational History  . Not on file  Tobacco Use  . Smoking status: Never Smoker  . Smokeless tobacco: Never Used  Substance and Sexual Activity  . Alcohol use: Yes    Comment: occasion  . Drug use: No  . Sexual activity: Not on file  Other Topics Concern  . Not on file  Social History Narrative   Single.   2 children, 1 step-son.   Manager at [employer].   Enjoys going to [employer], spending time with family.    Social Determinants of Health   Financial Resource Strain:   . Difficulty of Paying Living Expenses: Not on file  Food Insecurity:   . Worried About Programme researcher, broadcasting/film/video in the Last Year: Not on file  . Ran Out of Food in the Last Year: Not on file  Transportation Needs:   . Lack of Transportation (Medical): Not on file  . Lack of Transportation (Non-Medical): Not on file  Physical Activity:   . Days of Exercise per Week: Not on file  . Minutes of Exercise per Session: Not on file  Stress:   . Feeling of Stress : Not on file  Social Connections:   . Frequency of Communication with Friends and Family: Not on file  . Frequency of Social Gatherings with Friends and Family: Not on file  . Attends Religious Services: Not on file  . Active Member of Clubs or Organizations: Not on file  . Attends Banker Meetings: Not on file  . Marital Status: Not on file  Intimate Partner Violence:   . Fear of Current or Ex-Partner: Not on file  . Emotionally Abused: Not on file  . Physically Abused: Not on file  . Sexually Abused: Not on file    Past Surgical History:  Procedure Laterality Date  . CESAREAN SECTION    . FRACTURE SURGERY    . ROBOTIC ASSISTED TOTAL HYSTERECTOMY N/A 03/05/2014   Procedure: ROBOTIC ASSISTED TOTAL HYSTERECTOMY/BILATERAL SALPINGECTOMY;  Surgeon: Serita Kyle, MD;  Location: [facility];  Service: Gynecology;  Laterality: N/A;  . TUBAL LIGATION      Family  History  Problem Relation Age of Onset  . Arthritis Mother   . Arthritis Father   . Lung cancer Father   . Heart attack Father   . Hyperlipidemia Maternal Grandmother   . Stroke Maternal Grandmother   . Hypertension Maternal Grandmother   . Heart attack Maternal Grandmother   . Alcohol abuse Paternal Grandmother   . Arthritis Paternal Grandmother   . Mental illness Paternal Grandmother   . Breast cancer Maternal Aunt   . Epilepsy Brother     Allergies  Allergen Reactions  . Promethazine Hcl Hives and Rash  . Sulfa Antibiotics Hives and Rash    Pt states sent her to the hospital.  Current Outpatient Medications on File Prior to Visit  Medication Sig Dispense Refill  . albuterol (VENTOLIN HFA) 108 (90 Base) MCG/ACT inhaler Inhale 2 puffs into the lungs every 4 (four) hours as needed for wheezing or shortness of breath. 1 Inhaler 0  . cetirizine (ZYRTEC) 10 MG tablet Take 10 mg by mouth daily.    . Homeopathic Products (SIMILASAN STYE EYE RELIEF OP) Place 1 drop into the left eye 2 (two) times daily.    . montelukast (SINGULAIR) 10 MG tablet TAKE 1 TABLET (10 MG TOTAL) BY MOUTH AT BEDTIME. FOR ALLERGIES/ASTHMA. 90 tablet 3  . Multiple Vitamins-Minerals (WOMENS ONE DAILY PO) Take by mouth daily.     . traZODone (DESYREL) 50 MG tablet TAKE 1-2 TABLETS BY MOUTH 30 MINUTES PRIOR TO SLEEP. 180 tablet 0  . diltiazem (CARDIZEM CD) 180 MG 24 hr capsule Take 1 capsule (180 mg total) by mouth daily. 90 capsule 1   No current facility-administered medications on file prior to visit.    BP 126/80   Pulse (!) 115   Temp (!) 96.8 F (36 C) (Temporal)   Ht 5\' 6"  (1.676 m)   Wt 111 lb 8 oz (50.6 kg)   LMP 03/05/2014   SpO2 98%   BMI 18.00 kg/m    Objective:   Physical Exam  Constitutional: She appears well-nourished.  Cardiovascular: Normal rate and regular rhythm.  Respiratory: Effort normal and breath sounds normal.  GI: Soft. Bowel sounds are normal. She exhibits no ascites.  There is no hepatomegaly. There is no abdominal tenderness.  Musculoskeletal:     Cervical back: Neck supple.  Skin: Skin is warm and dry.  Psychiatric: She has a normal mood and affect.           Assessment & Plan:

## 2019-11-09 NOTE — Assessment & Plan Note (Signed)
Hallucinations with Trazodone that is likely due to mixing anti-depressant with excessive alcohol consumption. Will not provide Ambien given knowledge of alcohol abuse.  She is not admitting to alcohol abuse at this point so treatment is difficult. Suspect that her insomnia will improve once she has been through alcohol detox program.   Will not be prescribing anything for insomnia at this point.

## 2019-11-10 ENCOUNTER — Ambulatory Visit: Payer: 59 | Admitting: Family Medicine

## 2019-11-10 LAB — HEPATITIS PANEL, ACUTE
Hep A IgM: NONREACTIVE
Hep B C IgM: NONREACTIVE
Hepatitis B Surface Ag: NONREACTIVE
Hepatitis C Ab: NONREACTIVE
SIGNAL TO CUT-OFF: 0.02 (ref <1.00)

## 2019-11-14 ENCOUNTER — Telehealth: Payer: Self-pay | Admitting: Primary Care

## 2019-11-14 NOTE — Telephone Encounter (Signed)
Patient called requesting to speak to Kelly Walton about patient's lab results.

## 2019-11-14 NOTE — Telephone Encounter (Signed)
This was actually patient's fiance calling, [name] (on [facility]) regarding her most recent test results.  I discussed the patient's most recent test results with her fiance and also notified him that she continues to deny alcohol abuse. He mentioned that she told him that her results were "normal".  I discussed that I have gone through the most recent abnormal test results with the patient, explained the elevated liver enzyme readings, and discussed potential complications of liver disease. She continued to deny alcohol abuse as evidenced on my chart. He plans on bringing in the patient for an office visit and he will have her tell me the truth regarding her alcohol abuse.  We likely need to repeat her liver ultrasound given recent levels.  I strongly advised he look into alcohol detox programs in the interim.

## 2019-11-20 ENCOUNTER — Other Ambulatory Visit: Payer: Self-pay | Admitting: Primary Care

## 2019-11-20 DIAGNOSIS — R7989 Other specified abnormal findings of blood chemistry: Secondary | ICD-10-CM

## 2019-11-22 ENCOUNTER — Telehealth: Payer: Self-pay | Admitting: Primary Care

## 2019-11-22 NOTE — Telephone Encounter (Signed)
Patient's Fiance [name] called today requesting a call back from you He stated it was in regards to a conversation you had previously and it has some questions he wanted to discuss . Please advise

## 2019-11-22 NOTE — Telephone Encounter (Signed)
Spoke with patient's fianc, he updated me regarding her alcohol abuse status. He states that she is now aware that I know about her severe alcohol abuse which continues to be a problem. She is scheduled for a liver biopsy next week, will await results.  I strongly advised that he and his family work towards convincing her to check into a rehab program. He will update.

## 2019-11-29 ENCOUNTER — Other Ambulatory Visit: Payer: Self-pay | Admitting: Radiology

## 2019-11-30 ENCOUNTER — Other Ambulatory Visit: Payer: Self-pay | Admitting: Physician Assistant

## 2019-11-30 ENCOUNTER — Other Ambulatory Visit: Payer: Self-pay

## 2019-11-30 ENCOUNTER — Ambulatory Visit (HOSPITAL_COMMUNITY)
Admission: RE | Admit: 2019-11-30 | Discharge: 2019-11-30 | Disposition: A | Discharge status: Home / Self Care | Payer: 59 | Source: Ambulatory Visit | Attending: Primary Care | Admitting: Primary Care

## 2019-11-30 DIAGNOSIS — K739 Chronic hepatitis, unspecified: Secondary | ICD-10-CM | POA: Insufficient documentation

## 2019-11-30 DIAGNOSIS — R7989 Other specified abnormal findings of blood chemistry: Secondary | ICD-10-CM | POA: Diagnosis not present

## 2019-11-30 DIAGNOSIS — R7401 Elevation of levels of liver transaminase levels: Secondary | ICD-10-CM | POA: Diagnosis not present

## 2019-11-30 LAB — CBC
HCT: 48 % — ABNORMAL HIGH (ref 36.0–46.0)
Hemoglobin: 15.9 g/dL — ABNORMAL HIGH (ref 12.0–15.0)
MCH: 31.2 pg (ref 26.0–34.0)
MCHC: 33.1 g/dL (ref 30.0–36.0)
MCV: 94.3 fL (ref 80.0–100.0)
Platelets: 178 10*3/uL (ref 150–400)
RBC: 5.09 MIL/uL (ref 3.87–5.11)
RDW: 11.9 % (ref 11.5–15.5)
WBC: 4.5 10*3/uL (ref 4.0–10.5)
nRBC: 0 % (ref 0.0–0.2)

## 2019-11-30 LAB — PROTIME-INR
INR: 0.9 (ref 0.8–1.2)
Prothrombin Time: 12.4 seconds (ref 11.4–15.2)

## 2019-11-30 MED ORDER — HYDROCODONE-ACETAMINOPHEN 5-325 MG PO TABS: 1.0000 | ORAL_TABLET | ORAL | Status: DC | PRN

## 2019-11-30 MED ORDER — FENTANYL CITRATE (PF) 100 MCG/2ML IJ SOLN
INTRAMUSCULAR | Status: AC
  Filled 2019-11-30: qty 2

## 2019-11-30 MED ORDER — MIDAZOLAM HCL 2 MG/2ML IJ SOLN
INTRAMUSCULAR | Status: AC
  Filled 2019-11-30: qty 2

## 2019-11-30 MED ORDER — LIDOCAINE HCL (PF) 1 % IJ SOLN
INTRAMUSCULAR | Status: AC
  Filled 2019-11-30: qty 30

## 2019-11-30 MED ORDER — MIDAZOLAM HCL 2 MG/2ML IJ SOLN
INTRAMUSCULAR | Status: AC | PRN
  Administered 2019-11-30: 1 mg via INTRAVENOUS
  Administered 2019-11-30: 0.5 mg via INTRAVENOUS

## 2019-11-30 MED ORDER — GELATIN ABSORBABLE 12-7 MM EX MISC
CUTANEOUS | Status: AC
  Filled 2019-11-30: qty 1

## 2019-11-30 MED ORDER — FENTANYL CITRATE (PF) 100 MCG/2ML IJ SOLN
INTRAMUSCULAR | Status: AC | PRN
  Administered 2019-11-30 (×2): 25 ug via INTRAVENOUS

## 2019-11-30 MED ORDER — SODIUM CHLORIDE 0.9 % IV SOLN: INTRAVENOUS | Status: DC

## 2019-11-30 NOTE — Procedures (Signed)
  Procedure: US core liver biopsy   EBL:   minimal Complications:  none immediate  See full dictation in [facility] PACS.  D. Erlinda Solinger MD Main # [phone] Pager  [phone]    

## 2019-11-30 NOTE — Discharge Instructions (Addendum)

## 2019-11-30 NOTE — H&P (Signed)
Chief Complaint: Patient was seen in consultation today for random liver biopsy.  Referring Physician(s): Clark,Katherine K  Supervising Physician: Arne Cleveland  Patient Status: [facility] - Out-pt  History of Present Illness: Kelly Walton is a 49 y.o. female with a past medical history significant for asthma, palpitations, HTN and ETOH abuse who presents today for a random liver biopsy. Kelly Walton was noted to have elevated LFTs starting in 2017 which have progressively worsened. She initially denied any ETOH use, however her husband notified her PCP that she actually did have a significant ETOH problem for at least 10 years. She had a liver ultrasound in in December of 2020 which was unremarkable. IR has been asked to perform a random liver biopsy for further evaluation of her chronically elevated LFTs.  Kelly Walton denies any complaints today besides anxiety about the procedure. She does not have any abdominal pain, n/v and has been eating/drinking well at home. She states understanding of the requested procedure and wishes to proceed as planned.   Past Medical History:  Diagnosis Date  . Acute right lower quadrant pain 06/10/2018  . Alcohol abuse   . Asthma   . Hypertension   . Insomnia   . Pain of upper abdomen 06/21/2017  . Palpitations    a. 05/2019 Echo: EF 60-65%, diast dysfxn; b. 05/2019 Zio: Sinus rhythm-sinus tach. Avg HR 105. 6 SVT episodes - longest 7 beats. Rare PVCs-->Diltiazem added.  Marland Kitchen Spongiotic dermatitis     Past Surgical History:  Procedure Laterality Date  . CESAREAN SECTION    . FRACTURE SURGERY    . ROBOTIC ASSISTED TOTAL HYSTERECTOMY N/A 03/05/2014   Procedure: ROBOTIC ASSISTED TOTAL HYSTERECTOMY/BILATERAL SALPINGECTOMY;  Surgeon: Marvene Staff, MD;  Location: [facility];  Service: Gynecology;  Laterality: N/A;  . TUBAL LIGATION      Allergies: Promethazine hcl, Sulfa antibiotics, and Trazodone and nefazodone  Medications: Prior to Admission  medications   Medication Sig Start Date End Date Taking? Authorizing Provider  albuterol (VENTOLIN HFA) 108 (90 Base) MCG/ACT inhaler Inhale 2 puffs into the lungs every 4 (four) hours as needed for wheezing or shortness of breath. 02/16/19  Yes Pleas Koch, NP  amLODipine (NORVASC) 10 MG tablet Take 10 mg by mouth at bedtime.   Yes [provider]  montelukast (SINGULAIR) 10 MG tablet TAKE 1 TABLET (10 MG TOTAL) BY MOUTH AT BEDTIME. FOR ALLERGIES/ASTHMA. 08/25/19  Yes Pleas Koch, NP  naproxen sodium (ALEVE) 220 MG tablet Take 440 mg by mouth 2 (two) times daily as needed (pain).   Yes [provider]  zolpidem (AMBIEN) 5 MG tablet Take 2.5 mg by mouth at bedtime.   Yes [provider]  diltiazem (CARDIZEM CD) 180 MG 24 hr capsule Take 1 capsule (180 mg total) by mouth daily. Patient not taking: Reported on 11/29/2019 06/16/19 11/29/19  Wellington Hampshire, MD  traZODone (DESYREL) 50 MG tablet TAKE 1-2 TABLETS BY MOUTH 30 MINUTES PRIOR TO SLEEP. Patient not taking: Reported on 11/29/2019 10/18/19   Pleas Koch, NP     Family History  Problem Relation Age of Onset  . Arthritis Mother   . Arthritis Father   . Lung cancer Father   . Heart attack Father   . Hyperlipidemia Maternal Grandmother   . Stroke Maternal Grandmother   . Hypertension Maternal Grandmother   . Heart attack Maternal Grandmother   . Alcohol abuse Paternal Grandmother   . Arthritis Paternal Grandmother   . Mental illness  Paternal Grandmother   . Breast cancer Maternal Aunt   . Epilepsy Brother     Social History   Socioeconomic History  . Marital status: Single    Spouse name: Not on file  . Number of children: Not on file  . Years of education: Not on file  . Highest education level: Not on file  Occupational History  . Not on file  Tobacco Use  . Smoking status: Never Smoker  . Smokeless tobacco: Never Used  Substance and Sexual Activity  . Alcohol use: Yes     Comment: occasion  . Drug use: No  . Sexual activity: Not on file  Other Topics Concern  . Not on file  Social History Narrative   Single.   2 children, 1 step-son.   Manager at [employer].   Enjoys going to [employer], spending time with family.    Social Determinants of Health   Financial Resource Strain:   . Difficulty of Paying Living Expenses:   Food Insecurity:   . Worried About Programme researcher, broadcasting/film/video in the Last Year:   . Barista in the Last Year:   Transportation Needs:   . Freight forwarder (Medical):   Marland Kitchen Lack of Transportation (Non-Medical):   Physical Activity:   . Days of Exercise per Week:   . Minutes of Exercise per Session:   Stress:   . Feeling of Stress :   Social Connections:   . Frequency of Communication with Friends and Family:   . Frequency of Social Gatherings with Friends and Family:   . Attends Religious Services:   . Active Member of Clubs or Organizations:   . Attends Banker Meetings:   Marland Kitchen Marital Status:      Review of Systems: A 12 point ROS discussed and pertinent positives are indicated in the HPI above.  All other systems are negative.  Review of Systems  Constitutional: Negative for chills and fever.  HENT: Negative for nosebleeds.   Respiratory: Negative for cough and shortness of breath.   Cardiovascular: Negative for chest pain.  Gastrointestinal: Negative for abdominal pain, blood in stool, diarrhea, nausea and vomiting.  Genitourinary: Negative for hematuria.  Musculoskeletal: Negative for back pain.  Neurological: Negative for dizziness and headaches.    Vital Signs: BP (!) 145/108   Pulse 96   Temp 98 F (36.7 C) (Skin)   Resp 16   Ht 5\' 4"  (1.626 m)   Wt 110 lb (49.9 kg)   LMP 03/05/2014   SpO2 100%   BMI 18.88 kg/m   Physical Exam Vitals reviewed.  Constitutional:      General: She is not in acute distress. HENT:     Head: Normocephalic.     Mouth/Throat:     Mouth: Mucous membranes are moist.      Pharynx: Oropharynx is clear. No oropharyngeal exudate or posterior oropharyngeal erythema.  Eyes:     General: No scleral icterus. Cardiovascular:     Rate and Rhythm: Normal rate and regular rhythm.  Pulmonary:     Effort: Pulmonary effort is normal.     Breath sounds: Normal breath sounds.  Abdominal:     General: There is no distension.     Palpations: Abdomen is soft.     Tenderness: There is no abdominal tenderness.  Skin:    General: Skin is warm and dry.     Coloration: Skin is not jaundiced.  Neurological:     Mental Status: She is  alert and oriented to person, place, and time.  Psychiatric:        Mood and Affect: Mood normal.        Behavior: Behavior normal.        Thought Content: Thought content normal.        Judgment: Judgment normal.      MD Evaluation Airway: WNL Heart: WNL Abdomen: WNL Chest/ Lungs: WNL ASA  Classification: 2 Mallampati/Airway Score: One   Imaging: No results found.  Labs:  CBC: Recent Labs    05/11/19 0916 09/02/19 2223 11/09/19 1113 11/30/19 1131  WBC 7.4 8.3 5.4 4.5  HGB 13.6 13.9 15.8* 15.9*  HCT 40.2 41.7 47.2* 48.0*  PLT 305 147* 248.0 178    COAGS: Recent Labs    11/30/19 1131  INR 0.9    BMP: Recent Labs    05/11/19 0916 09/02/19 2223 10/10/19 1002  NA 141 137 143  K 4.0 3.3* 3.7  CL 100 102 105  CO2 23 23 29   GLUCOSE 87 122* 89  BUN 14 6 8   CALCIUM 9.8 8.7* 9.2  CREATININE 0.89 0.94 0.77  GFRNONAA 77 >60  --   GFRAA 89 >60  --     LIVER FUNCTION TESTS: Recent Labs    09/02/19 2223 10/10/19 1002 11/09/19 1113  BILITOT 1.6* 0.3 0.6  AST 180* 126* 202*  ALT 132* 68* 100*  ALKPHOS 156* 103 114  PROT 7.4 7.0 7.2  ALBUMIN 4.1 4.0 4.2    TUMOR MARKERS: No results for input(s): AFPTM, CEA, CA199, CHROMGRNA in the last 8760 hours.  Assessment and Plan:  49 y/o F with history of significant ETOH abuse and elevated LFTs with unremarkable 01/09/20 08/2019 who presents today for a random liver  biopsy for further evaluation.  Patient has been NPO since midnight, she does not take any blood thinning medications. Afebrile, WBC 4.5, hgb 15.9, plt 178, INR 0.9.  Risks and benefits of random liver biopsy was discussed with the patient and/or patient's family including, but not limited to bleeding, infection, damage to adjacent structures or low yield requiring additional tests.  All of the questions were answered and there is agreement to proceed.  Consent signed and in chart.  Thank you for this interesting consult.  I greatly enjoyed meeting Kelly Walton and look forward to participating in their care.  A copy of this report was sent to the requesting provider on this date.  Electronically Signed: 09/2019, PA-C 11/30/2019, 12:07 PM   I spent a total of  30 Minutes in face to face in clinical consultation, greater than 50% of which was counseling/coordinating care for random liver biopsy.

## 2019-12-04 ENCOUNTER — Other Ambulatory Visit: Payer: Self-pay

## 2019-12-06 ENCOUNTER — Other Ambulatory Visit: Payer: Self-pay | Admitting: Family Medicine

## 2019-12-06 DIAGNOSIS — G4700 Insomnia, unspecified: Secondary | ICD-10-CM

## 2019-12-06 LAB — SURGICAL PATHOLOGY

## 2019-12-06 MED ORDER — TIZANIDINE HCL 2 MG PO TABS: 2.0000 mg | ORAL_TABLET | Freq: Four times a day (QID) | ORAL | 1 refills | Status: DC | PRN

## 2019-12-07 NOTE — Telephone Encounter (Signed)
Spoke with [name] via phone to discuss his concerns and biopsy results. He confirms that the patient has significantly cut back on alcohol intake for the last 1-2 weeks.  She is getting closer to a decision to quit.  Discussed the absolute need to abstain from alcohol intake. He will have patient schedule a lab appointment for repeat liver enzymes.

## 2019-12-07 NOTE — Telephone Encounter (Signed)
Patient's Fiance david called today He would like a call back to discuss the Biopsy results, He stated that the patient is not being fully honest with him about the results and he is concerned.   C/B # [phone]

## 2019-12-12 ENCOUNTER — Other Ambulatory Visit: Payer: Self-pay | Admitting: Primary Care

## 2019-12-12 DIAGNOSIS — G4700 Insomnia, unspecified: Secondary | ICD-10-CM

## 2019-12-12 MED ORDER — HYDROXYZINE HCL 10 MG PO TABS: 10.0000 mg | ORAL_TABLET | Freq: Every evening | ORAL | 0 refills | Status: DC | PRN

## 2019-12-12 NOTE — Telephone Encounter (Signed)
There is a Clinical cytogeneticist message about this

## 2019-12-14 ENCOUNTER — Other Ambulatory Visit: Payer: Self-pay | Admitting: Family Medicine

## 2019-12-15 ENCOUNTER — Other Ambulatory Visit: Payer: Self-pay | Admitting: Primary Care

## 2019-12-15 DIAGNOSIS — I10 Essential (primary) hypertension: Secondary | ICD-10-CM

## 2019-12-17 ENCOUNTER — Other Ambulatory Visit: Payer: Self-pay | Admitting: Cardiovascular Disease

## 2019-12-19 ENCOUNTER — Other Ambulatory Visit: Payer: Self-pay | Admitting: Primary Care

## 2019-12-19 DIAGNOSIS — G4700 Insomnia, unspecified: Secondary | ICD-10-CM

## 2019-12-20 NOTE — Telephone Encounter (Signed)
Refill sent to pharmacy.   

## 2019-12-20 NOTE — Telephone Encounter (Signed)
Ok to change. Last prescribed on 12/12/2019. Last appointment on 11/09/2019.  No future appointment

## 2019-12-28 ENCOUNTER — Other Ambulatory Visit: Payer: Self-pay | Admitting: Family Medicine

## 2020-01-02 ENCOUNTER — Ambulatory Visit (INDEPENDENT_AMBULATORY_CARE_PROVIDER_SITE_OTHER): Payer: 59 | Admitting: Primary Care

## 2020-01-02 ENCOUNTER — Encounter: Payer: Self-pay | Admitting: Primary Care

## 2020-01-02 ENCOUNTER — Other Ambulatory Visit: Payer: Self-pay

## 2020-01-02 VITALS — BP 130/80 | HR 86 | Temp 96.1°F | Ht 66.0 in | Wt 118.8 lb

## 2020-01-02 DIAGNOSIS — F411 Generalized anxiety disorder: Secondary | ICD-10-CM

## 2020-01-02 DIAGNOSIS — R7989 Other specified abnormal findings of blood chemistry: Secondary | ICD-10-CM | POA: Diagnosis not present

## 2020-01-02 DIAGNOSIS — G4700 Insomnia, unspecified: Secondary | ICD-10-CM

## 2020-01-02 DIAGNOSIS — F1011 Alcohol abuse, in remission: Secondary | ICD-10-CM | POA: Insufficient documentation

## 2020-01-02 DIAGNOSIS — I10 Essential (primary) hypertension: Secondary | ICD-10-CM | POA: Diagnosis not present

## 2020-01-02 DIAGNOSIS — F1010 Alcohol abuse, uncomplicated: Secondary | ICD-10-CM | POA: Diagnosis not present

## 2020-01-02 LAB — HEPATIC FUNCTION PANEL
ALT: 44 U/L — ABNORMAL HIGH (ref 0–35)
AST: 60 U/L — ABNORMAL HIGH (ref 0–37)
Albumin: 4.2 g/dL (ref 3.5–5.2)
Alkaline Phosphatase: 87 U/L (ref 39–117)
Bilirubin, Direct: 0.1 mg/dL (ref 0.0–0.3)
Total Bilirubin: 0.4 mg/dL (ref 0.2–1.2)
Total Protein: 7.2 g/dL (ref 6.0–8.3)

## 2020-01-02 MED ORDER — SERTRALINE HCL 50 MG PO TABS: 50.0000 mg | ORAL_TABLET | Freq: Every day | ORAL | 1 refills | Status: DC

## 2020-01-02 NOTE — Progress Notes (Signed)
Subjective:    Patient ID: Kelly Walton, female    DOB: 02-16-1971, 49 y.o.   MRN: [id number]  HPI  This visit occurred during the SARS-CoV-2 public health emergency.  Safety protocols were in place, including screening questions prior to the visit, additional usage of staff PPE, and extensive cleaning of exam room while observing appropriate contact time as indicated for disinfecting solutions.   Ms. Kelly Walton is a 49 year old female with a history of alcohol abuse, insomnia, elevated LFT's, hypertension who presents today for follow up.  1) Alcohol Abuse: Chronic and with heavy alcohol consumption daily. She is currently drinking 2-3 shots weekly on average. No daily use of alcohol.   2) Insomnia/Anxiety: Previously managed on Ambien for years, mentioned at a prior visit that Ambien wasn't keeping her asleep at night we changed her regimen to Trazodone. She could not tolerate trazodone, likely due to severe alcohol abuse that was not made aware to [place] until later. Currently taking hydroxyzine.  She has little difficulty falling asleep, but will wake a few hours later due to mind racing thoughts and will not fall back asleep. She does feel anxious at night.   She does have frequent irritability and restlessness, some worry. GAD 7 score of 19 today.   3) Ankle Edema: She has noticed bilateral ankle and foot edema over the last several weeks. She does work on her feet during her work day but will also notice swelling when she is off work. Swelling is worse after her 8 hour shift. She is managed on amlodipine.   Wt Readings from Last 3 Encounters:  01/02/20 118 lb 12 oz (53.9 kg)  11/30/19 110 lb (49.9 kg)  11/09/19 111 lb 8 oz (50.6 kg)   BP Readings from Last 3 Encounters:  01/02/20 130/80  11/30/19 139/87  11/09/19 126/80     Review of Systems  Cardiovascular:       Ankle and foot edema  Gastrointestinal: Negative for abdominal pain.  Skin: Negative for color change.    Psychiatric/Behavioral: Positive for sleep disturbance. The patient is nervous/anxious.        See HPI       Past Medical History:  Diagnosis Date  . Acute right lower quadrant pain 06/10/2018  . Alcohol abuse   . Asthma   . Hypertension   . Insomnia   . Pain of upper abdomen 06/21/2017  . Palpitations    a. 05/2019 Echo: EF 60-65%, diast dysfxn; b. 05/2019 Zio: Sinus rhythm-sinus tach. Avg HR 105. 6 SVT episodes - longest 7 beats. Rare PVCs-->Diltiazem added.  Marland Kitchen Spongiotic dermatitis      Social History   Socioeconomic History  . Marital status: Single    Spouse name: Not on file  . Number of children: Not on file  . Years of education: Not on file  . Highest education level: Not on file  Occupational History  . Not on file  Tobacco Use  . Smoking status: Never Smoker  . Smokeless tobacco: Never Used  Substance and Sexual Activity  . Alcohol use: Yes    Comment: occasion  . Drug use: No  . Sexual activity: Not on file  Other Topics Concern  . Not on file  Social History Narrative   Single.   2 children, 1 step-son.   Manager at [employer].   Enjoys going to [employer], spending time with family.    Social Determinants of Health   Financial Resource Strain:   . Difficulty  of Paying Living Expenses:   Food Insecurity:   . Worried About Programme researcher, broadcasting/film/video in the Last Year:   . Barista in the Last Year:   Transportation Needs:   . Freight forwarder (Medical):   Marland Kitchen Lack of Transportation (Non-Medical):   Physical Activity:   . Days of Exercise per Week:   . Minutes of Exercise per Session:   Stress:   . Feeling of Stress :   Social Connections:   . Frequency of Communication with Friends and Family:   . Frequency of Social Gatherings with Friends and Family:   . Attends Religious Services:   . Active Member of Clubs or Organizations:   . Attends Banker Meetings:   Marland Kitchen Marital Status:   Intimate Partner Violence:   . Fear of Current or  Ex-Partner:   . Emotionally Abused:   Marland Kitchen Physically Abused:   . Sexually Abused:     Past Surgical History:  Procedure Laterality Date  . CESAREAN SECTION    . FRACTURE SURGERY    . ROBOTIC ASSISTED TOTAL HYSTERECTOMY N/A 03/05/2014   Procedure: ROBOTIC ASSISTED TOTAL HYSTERECTOMY/BILATERAL SALPINGECTOMY;  Surgeon: Serita Kyle, MD;  Location: [facility];  Service: Gynecology;  Laterality: N/A;  . TUBAL LIGATION      Family History  Problem Relation Age of Onset  . Arthritis Mother   . Arthritis Father   . Lung cancer Father   . Heart attack Father   . Hyperlipidemia Maternal Grandmother   . Stroke Maternal Grandmother   . Hypertension Maternal Grandmother   . Heart attack Maternal Grandmother   . Alcohol abuse Paternal Grandmother   . Arthritis Paternal Grandmother   . Mental illness Paternal Grandmother   . Breast cancer Maternal Aunt   . Epilepsy Brother     Allergies  Allergen Reactions  . Promethazine Hcl Hives and Rash  . Sulfa Antibiotics Hives and Rash    Pt states sent her to the hospital.  . Trazodone And Nefazodone     Hallucinations     Current Outpatient Medications on File Prior to Visit  Medication Sig Dispense Refill  . albuterol (VENTOLIN HFA) 108 (90 Base) MCG/ACT inhaler Inhale 2 puffs into the lungs every 4 (four) hours as needed for wheezing or shortness of breath. 1 Inhaler 0  . amLODipine (NORVASC) 10 MG tablet TAKE 1 TABLET BY MOUTH EVERY DAY 90 tablet 1  . hydrOXYzine (ATARAX/VISTARIL) 10 MG tablet TAKE 1-2 TABLETS (10-20 MG TOTAL) BY MOUTH AT BEDTIME AS NEEDED. 180 tablet 0  . montelukast (SINGULAIR) 10 MG tablet TAKE 1 TABLET (10 MG TOTAL) BY MOUTH AT BEDTIME. FOR ALLERGIES/ASTHMA. 90 tablet 3  . naproxen sodium (ALEVE) 220 MG tablet Take 440 mg by mouth 2 (two) times daily as needed (pain).    Chelsea Aus ER 180 MG 24 hr capsule TAKE 1 CAPSULE BY MOUTH EVERY DAY 90 capsule 3  . tiZANidine (ZANAFLEX) 2 MG tablet TAKE 1-2 TABLETS (2-4 MG  TOTAL) BY MOUTH EVERY 6 (SIX) HOURS AS NEEDED FOR MUSCLE SPASMS. 60 tablet 1  . [DISCONTINUED] tiZANidine (ZANAFLEX) 2 MG tablet Take 1-2 tablets (2-4 mg total) by mouth every 6 (six) hours as needed for muscle spasms. 60 tablet 1   No current facility-administered medications on file prior to visit.    BP 130/80   Pulse 86   Temp (!) 96.1 F (35.6 C) (Temporal)   Ht 5\' 6"  (1.676 m)   Wt 118  lb 12 oz (53.9 kg)   LMP 03/05/2014   SpO2 98%   BMI 19.17 kg/m    Objective:   Physical Exam  Constitutional: She appears well-nourished.  Cardiovascular: Normal rate and regular rhythm.  Respiratory: Effort normal and breath sounds normal.  GI: Soft. Bowel sounds are normal. There is no abdominal tenderness.  Musculoskeletal:     Cervical back: Neck supple.  Skin: Skin is warm and dry.  Psychiatric: She has a normal mood and affect.           Assessment & Plan:

## 2020-01-02 NOTE — Patient Instructions (Signed)
Start sertraline (Zoloft) 50 mg for anxiety. Start with 1/2 tablet daily for 6-8 days, then increase to 1 full tablet thereafter.  Cut your amlodipine tablet in half, this is for blood pressure. This could be contributing to your ankle swelling. Please update me in 2 weeks.  Stop by the lab prior to leaving today. I will notify you of your results once received.   Please schedule a follow up visit for 6 weeks as discussed.   It was a pleasure to see you today!

## 2020-01-02 NOTE — Assessment & Plan Note (Signed)
Chronic and contributing to insomnia. She kindly declines therapy. GAD 7 score of 19 today.  Rx for Zoloft sent to pharmacy. Patient is to take 1/2 tablet daily for 8 days, then advance to 1 full tablet thereafter. We discussed possible side effects of headache, GI upset, drowsiness, and SI/HI. If thoughts of SI/HI develop, we discussed to present to the emergency immediately. Patient verbalized understanding.   Follow up in 6 weeks for re-evaluation.

## 2020-01-02 NOTE — Assessment & Plan Note (Signed)
Chronic history of severe abuse, has cut back to 2-3 shots weekly. See liver biopsy report. Repeat LFT's pending.

## 2020-01-02 NOTE — Assessment & Plan Note (Signed)
Remains uncontrolled. Suspect anxiety to be contributing. Discussed options, will treat anxiety with Zoloft course. Continue hydroxyzine.   Follow up in 6 weeks.

## 2020-01-02 NOTE — Assessment & Plan Note (Signed)
Suspect amlodipine to be contributing to ankle edema, discussed to cut dose in half for now, update in 2 weeks. Consider switching to losartan.

## 2020-01-02 NOTE — Assessment & Plan Note (Signed)
Patient endorses that she has cut back on alcohol use significantly. If this is the case then LFT's should be reduced. See liver biopsy report.  Repeat LFT's pending.

## 2020-01-08 ENCOUNTER — Other Ambulatory Visit: Payer: Self-pay | Admitting: Family Medicine

## 2020-01-18 ENCOUNTER — Other Ambulatory Visit: Payer: Self-pay | Admitting: Family Medicine

## 2020-01-19 ENCOUNTER — Other Ambulatory Visit: Payer: Self-pay | Admitting: Primary Care

## 2020-01-19 DIAGNOSIS — G4700 Insomnia, unspecified: Secondary | ICD-10-CM

## 2020-01-24 ENCOUNTER — Other Ambulatory Visit: Payer: Self-pay | Admitting: Primary Care

## 2020-01-24 DIAGNOSIS — F411 Generalized anxiety disorder: Secondary | ICD-10-CM

## 2020-02-03 ENCOUNTER — Other Ambulatory Visit: Payer: Self-pay | Admitting: Family Medicine

## 2020-02-16 ENCOUNTER — Other Ambulatory Visit: Payer: Self-pay | Admitting: Family Medicine

## 2020-02-23 ENCOUNTER — Other Ambulatory Visit: Payer: Self-pay | Admitting: Family Medicine

## 2020-02-28 ENCOUNTER — Encounter: Payer: Self-pay | Admitting: Orthopaedic Surgery

## 2020-02-28 ENCOUNTER — Other Ambulatory Visit: Payer: Self-pay

## 2020-02-28 ENCOUNTER — Ambulatory Visit (INDEPENDENT_AMBULATORY_CARE_PROVIDER_SITE_OTHER): Payer: No Typology Code available for payment source

## 2020-02-28 ENCOUNTER — Ambulatory Visit (INDEPENDENT_AMBULATORY_CARE_PROVIDER_SITE_OTHER): Payer: No Typology Code available for payment source | Admitting: Orthopaedic Surgery

## 2020-02-28 DIAGNOSIS — M79675 Pain in left toe(s): Secondary | ICD-10-CM

## 2020-02-28 DIAGNOSIS — M79672 Pain in left foot: Secondary | ICD-10-CM

## 2020-02-28 DIAGNOSIS — G8929 Other chronic pain: Secondary | ICD-10-CM | POA: Insufficient documentation

## 2020-02-28 NOTE — Progress Notes (Signed)
Office Visit Note   Patient: Kelly Walton           Date of Birth: 1971/07/09           MRN: [id number] Visit Date: 02/28/2020              Requested by: Doreene Nest, NP [address] [place],  [place] [address] PCP: Doreene Nest, NP   Assessment & Plan: Visit Diagnoses:  1. Pain in left foot   2. Toe pain, chronic, left     Plan: Kelly Walton has for the past several months been experiencing some pain on the dorsum of her left little toe and metatarsal phalangeal joint.  She is tried to elevate her foot but still has some discomfort.  She has been wearing sandals which seem to make a little bit of a difference.  She has noticed a small knot about the IP joint of the little toe.  No obvious injury or trauma.  Has had prior bunionette surgery.  She has a minimally prominent area over the IP joint of the little toe that is sensitive from rubbing against her shoe.  I am going to apply moleskin and tape over that area as well as the metatarsal phalangeal joint and I think that will help.  Needs to wear good comfortable shoe without any pressure applied to those 2 areas  Follow-Up Instructions: Return if symptoms worsen or fail to improve.   Orders:  Orders Placed This Encounter  Procedures  . XR Foot Complete Left   No orders of the defined types were placed in this encounter.     Procedures: No procedures performed   Clinical Data: No additional findings.   Subjective: Chief Complaint  Patient presents with  . Left Foot - Pain  For the past several months has been experiencing some discomfort about the left little toe IP joint and metatarsal phalangeal joint.  No injury or trauma.  Has had prior surgery for bunion at surgery.  Has been wearing sandals and attempt to make it "feel better but still having some discomfort.  Has noticed a little red spot about the IP joint of the little toe.  No numbness or tingling or obvious swelling  HPI  Review of  Systems   Objective: Vital Signs: LMP 03/05/2014   Physical Exam Constitutional:      Appearance: She is well-developed.  Eyes:     Pupils: Pupils are equal, round, and reactive to light.  Pulmonary:     Effort: Pulmonary effort is normal.  Skin:    General: Skin is warm and dry.  Neurological:     Mental Status: She is alert and oriented to person, place, and time.  Psychiatric:        Behavior: Behavior normal.     Ortho Exam left foot.  There is just a little prominence of the IP joint where the it slightly pink consistent with a pressure point from her shoes.  No obvious deformity.  No ecchymosis or erythema.  Also having some discomfort over the fifth metatarsal head without any skin changes or prominence.  No pain with range of motion of the metatarsal phalangeal joint.  Good capillary refill to the toe.  No recurrence of the bunionette  Specialty Comments:  No specialty comments available.  Imaging: XR Foot Complete Left  Result Date: 02/28/2020 Films of left foot were obtained in several projections.  Patient is experiencing pain along the little toe and  particularly over the IP joint of the little toe and at the level of the metacarpal phalangeal joint.  No acute changes.  Has some narrowing of the PIP joint but without any evidence of acute change or fracture.  Has had prior bunionette surgery without obvious complications.  No embedded hardware.  Alignment looks just fine.  No acute changes around the distal fifth metatarsal. no explanation roentgenographically for her pain    PMFS History: Patient Active Problem List   Diagnosis Date Noted  . Toe pain, chronic, left 02/28/2020  . GAD (generalized anxiety disorder) 01/02/2020  . Alcohol abuse 01/02/2020  . Hand injury, right, initial encounter 09/26/2019  . Palpitations 04/03/2019  . Seasonal allergies 01/16/2019  . Elevated LFTs 11/15/2018  . 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 Hyperlipidemia 07/08/2018  .  Asthma 08/20/2017  . Preventative health care 06/21/2017  . Insomnia 04/16/2017  . Essential hypertension 04/16/2017   Past Medical History:  Diagnosis Date  . Acute right lower quadrant pain 06/10/2018  . Alcohol abuse   . Asthma   . Hypertension   . Insomnia   . Pain of upper abdomen 06/21/2017  . Palpitations    a. 05/2019 Echo: EF 60-65%, diast dysfxn; b. 05/2019 Zio: Sinus rhythm-sinus tach. Avg HR 105. 6 SVT episodes - longest 7 beats. Rare PVCs-->Diltiazem added.  Marland Kitchen Spongiotic dermatitis     Family History  Problem Relation Age of Onset  . Arthritis Mother   . Arthritis Father   . Lung cancer Father   . Heart attack Father   . Hyperlipidemia Maternal Grandmother   . Stroke Maternal Grandmother   . Hypertension Maternal Grandmother   . Heart attack Maternal Grandmother   . Alcohol abuse Paternal Grandmother   . Arthritis Paternal Grandmother   . Mental illness Paternal Grandmother   . Breast cancer Maternal Aunt   . Epilepsy Brother     Past Surgical History:  Procedure Laterality Date  . CESAREAN SECTION    . FRACTURE SURGERY    . ROBOTIC ASSISTED TOTAL HYSTERECTOMY N/A 03/05/2014   Procedure: ROBOTIC ASSISTED TOTAL HYSTERECTOMY/BILATERAL SALPINGECTOMY;  Surgeon: Marvene Staff, MD;  Location: [facility];  Service: Gynecology;  Laterality: N/A;  . TUBAL LIGATION     Social History   Occupational History  . Not on file  Tobacco Use  . Smoking status: Never Smoker  . Smokeless tobacco: Never Used  Vaping Use  . Vaping Use: Never used  Substance and Sexual Activity  . Alcohol use: Yes    Comment: occasion  . Drug use: No  . Sexual activity: Not on file     Kelly Balding, MD   Note - This record has been created using [employer].  Chart creation errors have been sought, but may not always  have been located. Such creation errors do not reflect on  the standard of medical care.

## 2020-03-11 ENCOUNTER — Other Ambulatory Visit: Payer: Self-pay | Admitting: Primary Care

## 2020-03-11 ENCOUNTER — Other Ambulatory Visit: Payer: Self-pay | Admitting: Family Medicine

## 2020-03-11 DIAGNOSIS — G4700 Insomnia, unspecified: Secondary | ICD-10-CM

## 2020-03-12 ENCOUNTER — Encounter: Payer: Self-pay | Admitting: Primary Care

## 2020-03-12 ENCOUNTER — Ambulatory Visit (INDEPENDENT_AMBULATORY_CARE_PROVIDER_SITE_OTHER): Payer: No Typology Code available for payment source | Admitting: Primary Care

## 2020-03-12 ENCOUNTER — Other Ambulatory Visit: Payer: Self-pay

## 2020-03-12 VITALS — BP 126/80 | HR 92 | Temp 95.7°F | Ht 66.0 in | Wt 120.0 lb

## 2020-03-12 DIAGNOSIS — R197 Diarrhea, unspecified: Secondary | ICD-10-CM | POA: Diagnosis not present

## 2020-03-12 DIAGNOSIS — R1033 Periumbilical pain: Secondary | ICD-10-CM

## 2020-03-12 HISTORY — DX: Periumbilical pain: R10.33

## 2020-03-12 LAB — CBC WITH DIFFERENTIAL/PLATELET
Basophils Absolute: 0 10*3/uL (ref 0.0–0.1)
Basophils Relative: 0.9 % (ref 0.0–3.0)
Eosinophils Absolute: 0.1 10*3/uL (ref 0.0–0.7)
Eosinophils Relative: 2.9 % (ref 0.0–5.0)
HCT: 39.8 % (ref 36.0–46.0)
Hemoglobin: 13.5 g/dL (ref 12.0–15.0)
Lymphocytes Relative: 29.8 % (ref 12.0–46.0)
Lymphs Abs: 1.2 10*3/uL (ref 0.7–4.0)
MCHC: 34.1 g/dL (ref 30.0–36.0)
MCV: 88.9 fl (ref 78.0–100.0)
Monocytes Absolute: 0.3 10*3/uL (ref 0.1–1.0)
Monocytes Relative: 7.9 % (ref 3.0–12.0)
Neutro Abs: 2.4 10*3/uL (ref 1.4–7.7)
Neutrophils Relative %: 58.5 % (ref 43.0–77.0)
Platelets: 180 10*3/uL (ref 150.0–400.0)
RBC: 4.47 Mil/uL (ref 3.87–5.11)
RDW: 15.1 % (ref 11.5–15.5)
WBC: 4.1 10*3/uL (ref 4.0–10.5)

## 2020-03-12 LAB — LIPASE: Lipase: 15 U/L (ref 11.0–59.0)

## 2020-03-12 LAB — COMPREHENSIVE METABOLIC PANEL
ALT: 48 U/L — ABNORMAL HIGH (ref 0–35)
AST: 65 U/L — ABNORMAL HIGH (ref 0–37)
Albumin: 4.3 g/dL (ref 3.5–5.2)
Alkaline Phosphatase: 111 U/L (ref 39–117)
BUN: 9 mg/dL (ref 6–23)
CO2: 27 mEq/L (ref 19–32)
Calcium: 9.6 mg/dL (ref 8.4–10.5)
Chloride: 98 mEq/L (ref 96–112)
Creatinine, Ser: 0.88 mg/dL (ref 0.40–1.20)
GFR: 68.58 mL/min (ref >60.00)
Glucose, Bld: 89 mg/dL (ref 70–99)
Potassium: 3.7 mEq/L (ref 3.5–5.1)
Sodium: 136 mEq/L (ref 135–145)
Total Bilirubin: 0.8 mg/dL (ref 0.2–1.2)
Total Protein: 7.2 g/dL (ref 6.0–8.3)

## 2020-03-12 NOTE — Assessment & Plan Note (Signed)
Acute pain for the last two weeks, abdominal mass for the last two months. Exam today with noted mass, tender with palpation.  She doesn't appear acutely ill, no guarding on exam.   Differentials include hernia, IBS, diarrhea of infectious source.   Checking labs today including CBC, CMP, stool studies. Abdominal ultrasound pending. She is stable for outpatient treatment.

## 2020-03-12 NOTE — Patient Instructions (Addendum)
Stop by the lab prior to leaving today. I will notify you of your results once received.   Return the stool kit as soon as possible.  You will be contacted regarding your ultrasound.  Please let us know if you have not been contacted within two to three days.  It was a pleasure to see you today!

## 2020-03-12 NOTE — Telephone Encounter (Signed)
Last prescribed on 12/20/2019 Last OV (acute ) with Kelly Walton on 03/12/2020 No future OV scheduled

## 2020-03-12 NOTE — Addendum Note (Signed)
Addended by: Alvina Chou on: 03/12/2020 02:33 PM   Modules accepted: Orders

## 2020-03-12 NOTE — Progress Notes (Signed)
Subjective:    Patient ID: Kelly Walton, female    DOB: 1971/08/05, 49 y.o.   MRN: [id number]  HPI  This visit occurred during the SARS-CoV-2 public health emergency.  Safety protocols were in place, including screening questions prior to the visit, additional usage of staff PPE, and extensive cleaning of exam room while observing appropriate contact time as indicated for disinfecting solutions.   Kelly Walton is a 49 year old female with a history of alcohol abuse, hypertension, elevated LFT's, GAD who presents today with a chief complaint of abdominal pain.  Her pain is located to the entire mid abdomen, bilaterally, intermittently for the last two weeks. She describes her pain as achy and dull, daily but intermittent. Pain is not associated with eating or drinking.   She's also noticed a "bump" to the left upper umbilical region, it is painful with palpation. Also with 2-3 episodes of diarrhea daily. She endorses little alcohol use.   She denies nausea, vomiting, changes in diet, fevers, strenuous activity, repetitive movement, bloody stools, decreased appetite, recent antibiotic use.  Wt Readings from Last 3 Encounters:  03/12/20 120 lb (54.4 kg)  01/02/20 118 lb 12 oz (53.9 kg)  11/30/19 110 lb (49.9 kg)     Review of Systems  Respiratory: Negative for shortness of breath.   Cardiovascular: Negative for chest pain.  Gastrointestinal: Positive for abdominal pain and diarrhea. Negative for blood in stool, nausea and vomiting.  Musculoskeletal: Negative for myalgias.  Neurological: Negative for weakness.       Past Medical History:  Diagnosis Date  . Acute right lower quadrant pain 06/10/2018  . Alcohol abuse   . Asthma   . Hypertension   . Insomnia   . Pain of upper abdomen 06/21/2017  . Palpitations    a. 05/2019 Echo: EF 60-65%, diast dysfxn; b. 05/2019 Zio: Sinus rhythm-sinus tach. Avg HR 105. 6 SVT episodes - longest 7 beats. Rare PVCs-->Diltiazem added.  Marland Kitchen Spongiotic  dermatitis      Social History   Socioeconomic History  . Marital status: Single    Spouse name: Not on file  . Number of children: Not on file  . Years of education: Not on file  . Highest education level: Not on file  Occupational History  . Not on file  Tobacco Use  . Smoking status: Never Smoker  . Smokeless tobacco: Never Used  Vaping Use  . Vaping Use: Never used  Substance and Sexual Activity  . Alcohol use: Yes    Comment: occasion  . Drug use: No  . Sexual activity: Not on file  Other Topics Concern  . Not on file  Social History Narrative   Single.   2 children, 1 step-son.   Manager at [employer].   Enjoys going to [employer], spending time with family.    Social Determinants of Health   Financial Resource Strain:   . Difficulty of Paying Living Expenses:   Food Insecurity:   . Worried About Programme researcher, broadcasting/film/video in the Last Year:   . Barista in the Last Year:   Transportation Needs:   . Freight forwarder (Medical):   Marland Kitchen Lack of Transportation (Non-Medical):   Physical Activity:   . Days of Exercise per Week:   . Minutes of Exercise per Session:   Stress:   . Feeling of Stress :   Social Connections:   . Frequency of Communication with Friends and Family:   . Frequency of Social  Gatherings with Friends and Family:   . Attends Religious Services:   . Active Member of Clubs or Organizations:   . Attends Banker Meetings:   Marland Kitchen Marital Status:   Intimate Partner Violence:   . Fear of Current or Ex-Partner:   . Emotionally Abused:   Marland Kitchen Physically Abused:   . Sexually Abused:     Past Surgical History:  Procedure Laterality Date  . CESAREAN SECTION    . FRACTURE SURGERY    . ROBOTIC ASSISTED TOTAL HYSTERECTOMY N/A 03/05/2014   Procedure: ROBOTIC ASSISTED TOTAL HYSTERECTOMY/BILATERAL SALPINGECTOMY;  Surgeon: Serita Kyle, MD;  Location: [facility];  Service: Gynecology;  Laterality: N/A;  . TUBAL LIGATION      Family History    Problem Relation Age of Onset  . Arthritis Mother   . Arthritis Father   . Lung cancer Father   . Heart attack Father   . Hyperlipidemia Maternal Grandmother   . Stroke Maternal Grandmother   . Hypertension Maternal Grandmother   . Heart attack Maternal Grandmother   . Alcohol abuse Paternal Grandmother   . Arthritis Paternal Grandmother   . Mental illness Paternal Grandmother   . Breast cancer Maternal Aunt   . Epilepsy Brother     Allergies  Allergen Reactions  . Promethazine Hcl Hives and Rash  . Sulfa Antibiotics Hives and Rash    Pt states sent her to the hospital.  . Trazodone And Nefazodone     Hallucinations     Current Outpatient Medications on File Prior to Visit  Medication Sig Dispense Refill  . albuterol (VENTOLIN HFA) 108 (90 Base) MCG/ACT inhaler Inhale 2 puffs into the lungs every 4 (four) hours as needed for wheezing or shortness of breath. 1 Inhaler 0  . amLODipine (NORVASC) 10 MG tablet TAKE 1 TABLET BY MOUTH EVERY DAY 90 tablet 1  . hydrOXYzine (ATARAX/VISTARIL) 10 MG tablet TAKE 1-2 TABLETS (10-20 MG TOTAL) BY MOUTH AT BEDTIME AS NEEDED. 180 tablet 0  . montelukast (SINGULAIR) 10 MG tablet TAKE 1 TABLET (10 MG TOTAL) BY MOUTH AT BEDTIME. FOR ALLERGIES/ASTHMA. 90 tablet 3  . naproxen sodium (ALEVE) 220 MG tablet Take 440 mg by mouth 2 (two) times daily as needed (pain).    Marland Kitchen sertraline (ZOLOFT) 50 MG tablet TAKE 1 TABLET (50 MG TOTAL) BY MOUTH DAILY. FOR ANXIETY. 90 tablet 0  . TIADYLT ER 180 MG 24 hr capsule TAKE 1 CAPSULE BY MOUTH EVERY DAY 90 capsule 3  . tiZANidine (ZANAFLEX) 2 MG tablet TAKE 1-2 TABLETS (2-4 MG TOTAL) BY MOUTH EVERY 6 (SIX) HOURS AS NEEDED FOR MUSCLE SPASMS. 60 tablet 1  . [DISCONTINUED] tiZANidine (ZANAFLEX) 2 MG tablet Take 1-2 tablets (2-4 mg total) by mouth every 6 (six) hours as needed for muscle spasms. 60 tablet 1  . [DISCONTINUED] tiZANidine (ZANAFLEX) 2 MG tablet TAKE 1-2 TABLETS (2-4 MG TOTAL) BY MOUTH EVERY 6 (SIX) HOURS AS  NEEDED FOR MUSCLE SPASMS. 60 tablet 1   No current facility-administered medications on file prior to visit.    BP 126/80   Pulse 92   Temp (!) 95.7 F (35.4 C) (Temporal)   Ht 5\' 6"  (1.676 m)   Wt 120 lb (54.4 kg)   LMP 03/05/2014   SpO2 97%   BMI 19.37 kg/m    Objective:   Physical Exam Constitutional:      General: She is not in acute distress.    Appearance: She is not ill-appearing.  Abdominal:  General: Abdomen is flat. Bowel sounds are normal.     Palpations: Abdomen is soft. There is mass.     Tenderness: There is abdominal tenderness in the periumbilical area. There is no guarding.       Comments: Small superficial mass noted to left upper umbilical region            Assessment & Plan:

## 2020-03-12 NOTE — Assessment & Plan Note (Signed)
Acute for the last two weeks, daily without blood. Checking stool studies, CBC, CMP.

## 2020-03-12 NOTE — Telephone Encounter (Signed)
Refills sent to pharmacy. 

## 2020-03-14 ENCOUNTER — Telehealth: Payer: Self-pay | Admitting: Primary Care

## 2020-03-14 LAB — GASTROINTESTINAL PATHOGEN PANEL PCR
C. difficile Tox A/B, PCR: NOT DETECTED
Campylobacter, PCR: NOT DETECTED
Cryptosporidium, PCR: NOT DETECTED
E coli (ETEC) LT/ST PCR: NOT DETECTED
E coli (STEC) stx1/stx2, PCR: NOT DETECTED
E coli 0157, PCR: NOT DETECTED
Giardia lamblia, PCR: NOT DETECTED
Norovirus, PCR: NOT DETECTED
Rotavirus A, PCR: NOT DETECTED
Salmonella, PCR: NOT DETECTED
Shigella, PCR: NOT DETECTED

## 2020-03-14 NOTE — Telephone Encounter (Signed)
Patient sent a message via my chart, will discuss through My Chart.

## 2020-03-14 NOTE — Telephone Encounter (Signed)
Called patient about [place] and she says she has been unable to work for Wednesday and Thursday due to still having nausea , vomiting and diarrhea. She cant hold water or gatorade down either. Can you give her a Dr's note as she cant work with this going on. What do you suggest she do? Abd [place] isnt until 03/27/20 as that was the soonest.

## 2020-03-22 ENCOUNTER — Other Ambulatory Visit: Payer: Self-pay | Admitting: Family Medicine

## 2020-03-27 ENCOUNTER — Ambulatory Visit
Admission: RE | Admit: 2020-03-27 | Discharge: 2020-03-27 | Disposition: A | Discharge status: Home / Self Care | Payer: No Typology Code available for payment source | Source: Ambulatory Visit | Attending: Primary Care | Admitting: Primary Care

## 2020-03-27 DIAGNOSIS — R1033 Periumbilical pain: Secondary | ICD-10-CM

## 2020-03-28 DIAGNOSIS — J4520 Mild intermittent asthma, uncomplicated: Secondary | ICD-10-CM

## 2020-04-04 ENCOUNTER — Other Ambulatory Visit: Payer: Self-pay

## 2020-04-04 ENCOUNTER — Encounter: Payer: Self-pay | Admitting: Physician Assistant

## 2020-04-04 ENCOUNTER — Ambulatory Visit (INDEPENDENT_AMBULATORY_CARE_PROVIDER_SITE_OTHER): Payer: No Typology Code available for payment source | Admitting: Physician Assistant

## 2020-04-04 VITALS — BP 100/64 | HR 66 | Ht 64.0 in | Wt 120.0 lb

## 2020-04-04 DIAGNOSIS — R000 Tachycardia, unspecified: Secondary | ICD-10-CM

## 2020-04-04 DIAGNOSIS — R072 Precordial pain: Secondary | ICD-10-CM

## 2020-04-04 DIAGNOSIS — I10 Essential (primary) hypertension: Secondary | ICD-10-CM | POA: Diagnosis not present

## 2020-04-04 DIAGNOSIS — R0602 Shortness of breath: Secondary | ICD-10-CM

## 2020-04-04 DIAGNOSIS — R002 Palpitations: Secondary | ICD-10-CM

## 2020-04-04 MED ORDER — DILTIAZEM HCL ER COATED BEADS 180 MG PO CP24
180.0000 mg | ORAL_CAPSULE | Freq: Every day | ORAL | Status: DC
Start: 2020-04-04 — End: 2020-08-12

## 2020-04-04 NOTE — Patient Instructions (Addendum)
Medication Instructions:  - Your physician has recommended you make the following change in your medication:  1) Stop amlodipine  2) Restart diltiazem 180 mg- take 1 capsule by mouth once daily  *If you need a refill on your cardiac medications before your next appointment, please call your pharmacy*   Lab Work: - none ordered  If you have labs (blood work) drawn today and your tests are completely normal, you will receive your results only by: Marland Kitchen MyChart Message (if you have MyChart) OR . A paper copy in the mail If you have any lab test that is abnormal or we need to change your treatment, we will call you to review the results.   Testing/Procedures: - Your physician has requested that you have a lexiscan myoview.   [facility] MYOVIEW  Your caregiver has ordered a Stress Test with nuclear imaging. The purpose of this test is to evaluate the blood supply to your heart muscle. This procedure is referred to as a "Non-Invasive Stress Test." This is because other than having an IV started in your vein, nothing is inserted or "invades" your body. Cardiac stress tests are done to find areas of poor blood flow to the heart by determining the extent of coronary artery disease (CAD). Some patients exercise on a treadmill, which naturally increases the blood flow to your heart, while others who are  unable to walk on a treadmill due to physical limitations have a pharmacologic/chemical stress agent called Lexiscan . This medicine will mimic walking on a treadmill by temporarily increasing your coronary blood flow.   Please note: these test may take anywhere between 2-4 hours to complete  PLEASE REPORT TO [facility] MEDICAL MALL ENTRANCE  THE VOLUNTEERS AT THE FIRST DESK WILL DIRECT YOU WHERE TO GO  Date of Procedure:_____________________________________  Arrival Time for Procedure:______________________________  Instructions regarding medication:   _x___ : You may take all of your regular medications  the morning of your test with enough water to get them down safely.  PLEASE NOTIFY THE OFFICE AT LEAST 24 HOURS IN ADVANCE IF YOU ARE UNABLE TO KEEP YOUR APPOINTMENT.  [phone] AND  PLEASE NOTIFY NUCLEAR MEDICINE AT [facility] AT LEAST 24 HOURS IN ADVANCE IF YOU ARE UNABLE TO KEEP YOUR APPOINTMENT. [phone]  How to prepare for your Myoview test:  1. Do not eat or drink after midnight 2. No caffeine for 24 hours prior to test 3. No smoking 24 hours prior to test. 4. Your medication may be taken with water.  If your doctor stopped a medication because of this test, do not take that medication. 5. Ladies, please do not wear dresses.  Skirts or pants are appropriate. Please wear a short sleeve shirt. 6. No perfume, cologne or lotion. 7. Wear comfortable walking shoes. No heels!        Follow-Up: At [facility], you and your health needs are our priority.  As part of our continuing mission to provide you with exceptional heart care, we have created designated Provider Care Teams.  These Care Teams include your primary Cardiologist (physician) and Advanced Practice Providers (APPs -  Physician Assistants and Nurse Practitioners) who all work together to provide you with the care you need, when you need it.  We recommend signing up for the patient portal called "MyChart".  Sign up information is provided on this After Visit Summary.  MyChart is used to connect with patients for Virtual Visits (Telemedicine).  Patients are able to view lab/test results, encounter notes, upcoming appointments,  etc.  Non-urgent messages can be sent to your provider as well.   To learn more about what you can do with MyChart, go to [email or web address].    Your next appointment:   After your stress test is completed   The format for your next appointment:   In Person  Provider:    You may see Lorine Bears, MD or one of the following Advanced Practice Providers on your designated Care Team:     Nicolasa Ducking, NP  Eula Listen, PA-C  Marisue Ivan, PA-C    Other Instructions    Cardiac Nuclear Scan A cardiac nuclear scan is a test that measures blood flow to the heart when a person is resting and when he or she is exercising. The test looks for problems such as:  Not enough blood reaching a portion of the heart.  The heart muscle not working normally. You may need this test if:  You have heart disease.  You have had abnormal lab results.  You have had heart surgery or a balloon procedure to open up blocked arteries (angioplasty).  You have chest pain.  You have shortness of breath. In this test, a radioactive dye (tracer) is injected into your bloodstream. After the tracer has traveled to your heart, an imaging device is used to measure how much of the tracer is absorbed by or distributed to various areas of your heart. This procedure is usually done at a hospital and takes 2-4 hours. Tell a health care provider about:  Any allergies you have.  All medicines you are taking, including vitamins, herbs, eye drops, creams, and over-the-counter medicines.  Any problems you or family members have had with anesthetic medicines.  Any blood disorders you have.  Any surgeries you have had.  Any medical conditions you have.  Whether you are pregnant or may be pregnant. What are the risks? Generally, this is a safe procedure. However, problems may occur, including:  Serious chest pain and heart attack. This is only a risk if the stress portion of the test is done.  Rapid heartbeat.  Sensation of warmth in your chest. This usually passes quickly.  Allergic reaction to the tracer. What happens before the procedure?  Ask your health care provider about changing or stopping your regular medicines. This is especially important if you are taking diabetes medicines or blood thinners.  Follow instructions from your health care provider about eating or drinking  restrictions.  Remove your jewelry on the day of the procedure. What happens during the procedure?  An IV will be inserted into one of your veins.  Your health care provider will inject a small amount of radioactive tracer through the IV.  You will wait for 20-40 minutes while the tracer travels through your bloodstream.  Your heart activity will be monitored with an electrocardiogram (ECG).  You will lie down on an exam table.  Images of your heart will be taken for about 15-20 minutes.  You may also have a stress test. For this test, one of the following may be done: ? You will exercise on a treadmill or stationary bike. While you exercise, your heart's activity will be monitored with an ECG, and your blood pressure will be checked. ? You will be given medicines that will increase blood flow to parts of your heart. This is done if you are unable to exercise.  When blood flow to your heart has peaked, a tracer will again be injected through the IV.  After 20-40  minutes, you will get back on the exam table and have more images taken of your heart.  Depending on the type of tracer used, scans may need to be repeated 3-4 hours later.  Your IV line will be removed when the procedure is over. The procedure may vary among health care providers and hospitals. What happens after the procedure?  Unless your health care provider tells you otherwise, you may return to your normal schedule, including diet, activities, and medicines.  Unless your health care provider tells you otherwise, you may increase your fluid intake. This will help to flush the contrast dye from your body. Drink enough fluid to keep your urine pale yellow.  Ask your health care provider, or the department that is doing the test: ? When will my results be ready? ? How will I get my results? Summary  A cardiac nuclear scan measures the blood flow to the heart when a person is resting and when he or she is  exercising.  Tell your health care provider if you are pregnant.  Before the procedure, ask your health care provider about changing or stopping your regular medicines. This is especially important if you are taking diabetes medicines or blood thinners.  After the procedure, unless your health care provider tells you otherwise, increase your fluid intake. This will help flush the contrast dye from your body.  After the procedure, unless your health care provider tells you otherwise, you may return to your normal schedule, including diet, activities, and medicines. This information is not intended to replace advice given to you by your health care provider. Make sure you discuss any questions you have with your health care provider. Document Revised: 02/07/2018 Document Reviewed: 02/07/2018 Elsevier Patient Education  2020 [employer].

## 2020-04-04 NOTE — Progress Notes (Signed)
Office Visit    Patient Name: Kelly Walton Date of Encounter: 04/04/2020  Primary Care Provider:  Doreene Nest, NP Primary Cardiologist:  Lorine Bears, MD  Chief Complaint    Chief Complaint  Patient presents with  . OTHER    OD 3 month f/u c/o rapid heart rate. Meds reviewed verbally with pt.    49 yo female with history of HTN, racing HR/palpitations, asthma, and here today for 3 month follow-up of palpitations.  Past Medical History    Past Medical History:  Diagnosis Date  . Acute right lower quadrant pain 06/10/2018  . Alcohol abuse   . Asthma   . Hypertension   . Insomnia   . Pain of upper abdomen 06/21/2017  . Palpitations    a. 05/2019 Echo: EF 60-65%, diast dysfxn; b. 05/2019 Zio: Sinus rhythm-sinus tach. Avg HR 105. 6 SVT episodes - longest 7 beats. Rare PVCs-->Diltiazem added.  Marland Kitchen Spongiotic dermatitis    Past Surgical History:  Procedure Laterality Date  . CESAREAN SECTION    . FRACTURE SURGERY    . ROBOTIC ASSISTED TOTAL HYSTERECTOMY N/A 03/05/2014   Procedure: ROBOTIC ASSISTED TOTAL HYSTERECTOMY/BILATERAL SALPINGECTOMY;  Surgeon: Serita Kyle, MD;  Location: [facility];  Service: Gynecology;  Laterality: N/A;  . TUBAL LIGATION      Allergies  Allergies  Allergen Reactions  . Promethazine Hcl Hives and Rash  . Sulfa Antibiotics Hives and Rash    Pt states sent her to the hospital.  . Trazodone And Nefazodone     Hallucinations     History of Present Illness    Kelly Walton is a 48 y.o. female with PMH as above. She has a history of palpitations, HTN, asthma, and remote drug and EtOH use. She established with Dr. Kirke Corin 05/2019 due to episodic tachypalpitations with dyspnea. EKG showed ST. Subsequent echo showed nl LVSF. Monitoring showed avg HR 105bpm and ST with 6 episodes SVT, longest of which was 7 beats. Rare PVCs were noted. She was placed on diltiazem 180mg  but was not certain if it helped her tachypalpitations at follow-up  08/16/2019. She reported episodes most noticeable during stressful periods and if noting elevated HR, creating additional anxiety. She occasionally noted associated DOE.   Today, she returns to clinic and notes racing HR. She notices the racing HR at rest and with exertion. She is a 14/05/2019 and reports that this is lately very stressful, which she suspects may be contributing to her symptoms. She also notes chest pain at rest and with exertion. CP is further described as brief episodes of sharp pain with radiation down her left arm but without clear triggers. She notes the CP can occur with the CP, as well as on separate occasions. Episodes of CP last less than a minute without clear alleviation factors. She also reports occasional SOB/DOE and palpitations. She reports she is very active though no regular workout routine reported. Medications reviewed today and she reports that she is taking amlodipine 10mg  daily but not the diltiazem. She is also on albuterol but uncertain if the racing HR corresponds to her inhaler. No alcohol or drug use. No s/sx of bleeding.  Home Medications    Prior to Admission medications   Medication Sig Start Date End Date Taking? Authorizing Provider  albuterol (VENTOLIN HFA) 108 (90 Base) MCG/ACT inhaler Inhale 2 puffs into the lungs every 4 (four) hours as needed for wheezing or shortness of breath. 02/16/19  Yes ,  NP  amLODipine (NORVASC) 10 MG tablet TAKE 1 TABLET BY MOUTH EVERY DAY 12/15/19  Yes Doreene Nest, NP  hydrOXYzine (ATARAX/VISTARIL) 10 MG tablet TAKE 1-2 TABLETS (10-20 MG TOTAL) BY MOUTH AT BEDTIME AS NEEDED. 03/12/20  Yes Doreene Nest, NP  montelukast (SINGULAIR) 10 MG tablet TAKE 1 TABLET (10 MG TOTAL) BY MOUTH AT BEDTIME. FOR ALLERGIES/ASTHMA. 08/25/19  Yes Doreene Nest, NP  naproxen sodium (ALEVE) 220 MG tablet Take 440 mg by mouth 2 (two) times daily as needed (pain).   Yes [provider]  sertraline (ZOLOFT) 50  MG tablet TAKE 1 TABLET (50 MG TOTAL) BY MOUTH DAILY. FOR ANXIETY. 01/24/20  Yes Doreene Nest, NP  TIADYLT ER 180 MG 24 hr capsule TAKE 1 CAPSULE BY MOUTH EVERY DAY 12/19/19  Yes Iran Ouch, MD  tiZANidine (ZANAFLEX) 2 MG tablet Take 1-2 tablets (2-4 mg total) by mouth every 6 (six) hours as needed for muscle spasms. 12/06/19   Hilts, Casimiro Needle, MD  tiZANidine (ZANAFLEX) 2 MG tablet TAKE 1-2 TABLETS (2-4 MG TOTAL) BY MOUTH EVERY 6 (SIX) HOURS AS NEEDED FOR MUSCLE SPASMS. 01/18/20   Hilts, Casimiro Needle, MD    Review of Systems    She reports chest pain that lasts less than a minute and is sharp pain with radiation down her L arm. Occurs at exertion and with rest. Occasional SOB/DOE racing HR/palpitations. No  pnd, orthopnea, n, v, dizziness, syncope, edema, weight gain, or early satiety.   All other systems reviewed and are otherwise negative except as noted above.  Physical Exam    VS:  BP (!) 100/64 (BP Location: Left Arm, Patient Position: Sitting, Cuff Size: Normal)   Pulse 66   Ht 5\' 4"  (1.626 m)   Wt 120 lb (54.4 kg)   LMP 03/05/2014   SpO2 97%   BMI 20.60 kg/m  , BMI Body mass index is 20.6 kg/m. GEN: Well nourished, well developed, in no acute distress. HEENT: normal. Neck: Supple, no JVD, carotid bruits, or masses. Cardiac: RRR, no murmurs, rubs, or gallops. No clubbing, cyanosis, edema.  Radials/DP/PT 2+ and equal bilaterally.  Respiratory:  Respirations regular and unlabored, clear to auscultation bilaterally. GI: Soft, nontender, nondistended, BS + x 4. MS: no deformity or atrophy. Skin: warm and dry, no rash. Neuro:  Strength and sensation are intact. Psych: Normal affect.  Accessory Clinical Findings    ECG personally reviewed by me today - SR 66bpm with first degree AVB and PRI 212, PRi increased from 196 to 212 since last tracing and rate decreased by 10bpm - no acute changes.  VITALS Reviewed today   Temp Readings from Last 3 Encounters:  03/12/20 (!) 95.7 F  (35.4 C) (Temporal)  01/02/20 (!) 96.1 F (35.6 C) (Temporal)  11/30/19 98 F (36.7 C) (Skin)   BP Readings from Last 3 Encounters:  04/04/20 (!) 100/64  03/12/20 126/80  01/02/20 130/80   Pulse Readings from Last 3 Encounters:  04/04/20 66  03/12/20 92  01/02/20 86    Wt Readings from Last 3 Encounters:  04/04/20 120 lb (54.4 kg)  03/12/20 120 lb (54.4 kg)  01/02/20 118 lb 12 oz (53.9 kg)     LABS  reviewed today   Lab Results  Component Value Date   WBC 4.1 03/12/2020   HGB 13.5 03/12/2020   HCT 39.8 03/12/2020   MCV 88.9 03/12/2020   PLT 180.0 03/12/2020   Lab Results  Component Value Date   CREATININE 0.88 03/12/2020  BUN 9 03/12/2020   NA 136 03/12/2020   K 3.7 03/12/2020   CL 98 03/12/2020   CO2 27 03/12/2020   Lab Results  Component Value Date   ALT 48 (H) 03/12/2020   AST 65 (H) 03/12/2020   ALKPHOS 111 03/12/2020   BILITOT 0.8 03/12/2020   Lab Results  Component Value Date   CHOL 240 (H) 10/10/2019   HDL 99.10 10/10/2019   LDLCALC 116 (H) 06/21/2017   LDLDIRECT 103.0 10/10/2019   TRIG 209.0 (H) 10/10/2019   CHOLHDL 2 10/10/2019    Lab Results  Component Value Date   HGBA1C 5.2 07/01/2018   Lab Results  Component Value Date   TSH 0.637 05/11/2019     STUDIES/PROCEDURES reviewed today   Zio 06/2019 Normal sinus rhythm with an average heart rate of 105 beats per minute. 6 episodes of supraventricular tachycardia noted the longest lasted 7 beats. Rare PVCs. Most triggered events correlated with sinus tachycardia.  Echo 05/2019 1. Left ventricular ejection fraction, by visual estimation, is 60 to  65%. The left ventricle has normal function. Normal left ventricular size.  There is no left ventricular hypertrophy.  2. Left ventricular diastolic Doppler parameters are consistent with  impaired relaxation pattern of LV diastolic filling.  3. Global right ventricle has normal systolic function.The right  ventricular size is  normal. No increase in right ventricular wall  thickness.  4. Normal pulmonary artery systolic pressure.  5. Left atrial size was normal.  6. Sinus tachycardia rate 106 bpm    Assessment & Plan    Racing HR, palpitations --Reports ongoing sx without any clear triggers and sometimes associated with CP and SOB/DOE. She does note a stressful job at [employer]. She does admit stress and anxiety may be contributing to her sx. Previous event monitoring unrevealing with avg HR 105bpm and brief runs SVT. She was started on diltiazem 180mg , which has since been discontinued. She is currently on amlodipine. Will restart diltiazem and discontinue her amlodipine. Given her CP, will also check a stress test. Ongoing lifestyle changes discussed.   Chest pain, atypical --New CP described as above. Previous echo as above with nl EF. EKG without acute changes but does show first degree AVB. LDL as above and 116. Atypical CP in that no clear triggers and occurs with exertion and at rest. Considered is anxiety and stress. Further workup considered with cardiac CT versus Lexiscan. Will obtain Lexiscan Myoview for further ischemic workup at this time.   Essential HTN --BP soft to hypotensive today. Medication changes as above. Denies presyncope/syncope.   SOB/DOE --Reports ongoing DOE. Continue to monitor and consider as secondary to episodes of racing HR. Lexiscan Myoview ordered as above to rule out SOB/DOE as anginal equivalent.   Essential HTN --Well controlled. Will discontinue amlodipine and restart diltiazem for further sx relief from palpitations. If breakthrough sx in the future, could consider addition of propanolol at that time.  Asthma --No wheezing today and discussed that albuterol can increase HR.   Medication changes: Stop amlodipine and restart diltiazem 180mg  Labs ordered: None Studies / Imaging ordered: Lexiscan Future considerations: Propanolol for breakthru sx or escalate diltiazem as  tolerated, addition of statin? Disposition: RTC following Lexiscan Myoview   , PA-C 04/04/2020

## 2020-04-04 NOTE — Progress Notes (Deleted)
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

## 2020-04-09 ENCOUNTER — Ambulatory Visit
Admission: RE | Admit: 2020-04-09 | Discharge: 2020-04-09 | Disposition: A | Discharge status: Home / Self Care | Payer: No Typology Code available for payment source | Source: Ambulatory Visit | Attending: Physician Assistant | Admitting: Physician Assistant

## 2020-04-09 ENCOUNTER — Other Ambulatory Visit: Payer: Self-pay

## 2020-04-09 DIAGNOSIS — R072 Precordial pain: Secondary | ICD-10-CM | POA: Diagnosis present

## 2020-04-09 LAB — NM MYOCAR MULTI W/SPECT W/WALL MOTION / EF
LV dias vol: 31 mL (ref 46–106)
LV sys vol: 11 mL
Peak HR: 104 {beats}/min
Rest HR: 78 {beats}/min
TID: 0.76

## 2020-04-09 MED ORDER — REGADENOSON 0.4 MG/5ML IV SOLN
0.4000 mg | Freq: Once | INTRAVENOUS | Status: AC
  Administered 2020-04-09: 0.4 mg via INTRAVENOUS

## 2020-04-09 MED ORDER — TECHNETIUM TC 99M TETROFOSMIN IV KIT
10.0000 | PACK | Freq: Once | INTRAVENOUS | Status: AC | PRN
  Administered 2020-04-09: 10.86 via INTRAVENOUS

## 2020-04-09 MED ORDER — ALBUTEROL SULFATE HFA 108 (90 BASE) MCG/ACT IN AERS: 2.0000 | INHALATION_SPRAY | Freq: Four times a day (QID) | RESPIRATORY_TRACT | 0 refills | Status: DC | PRN

## 2020-04-09 MED ORDER — TECHNETIUM TC 99M TETROFOSMIN IV KIT
31.7200 | PACK | Freq: Once | INTRAVENOUS | Status: AC | PRN
  Administered 2020-04-09: 31.72 via INTRAVENOUS

## 2020-04-10 ENCOUNTER — Encounter: Payer: Self-pay | Admitting: Family Medicine

## 2020-04-10 ENCOUNTER — Telehealth: Payer: Self-pay | Admitting: Physician Assistant

## 2020-04-10 ENCOUNTER — Ambulatory Visit (INDEPENDENT_AMBULATORY_CARE_PROVIDER_SITE_OTHER): Payer: No Typology Code available for payment source | Admitting: Family Medicine

## 2020-04-10 ENCOUNTER — Ambulatory Visit (INDEPENDENT_AMBULATORY_CARE_PROVIDER_SITE_OTHER): Payer: No Typology Code available for payment source

## 2020-04-10 DIAGNOSIS — M79672 Pain in left foot: Secondary | ICD-10-CM

## 2020-04-10 MED ORDER — DICLOFENAC SODIUM 1 % EX GEL
4.0000 g | Freq: Four times a day (QID) | CUTANEOUS | 6 refills | Status: DC | PRN
Start: 2020-04-10 — End: 2020-08-12

## 2020-04-10 NOTE — Progress Notes (Signed)
Office Visit Note   Patient: Kelly Walton           Date of Birth: [birth year]           MRN: [id number] Visit Date: 04/10/2020 Requested by: Doreene Nest, NP [address] [place],  [place] [address] PCP: Doreene Nest, NP  Subjective: Chief Complaint  Patient presents with  . Left Foot - Pain    Pain in the 5th MT & toe is no better since visit in June with Dr. Cleophas Dunker. H/o surgery in that area with Dr. Cleophas Dunker.    HPI: She is here with persistent left foot pain.  She had a fracture 5 years ago treated with pinning.  She recovered from that and was asymptomatic until a couple months ago.  No injury, pain on the lateral aspect of her foot near the fifth toe.  She had x-rays in June which were unremarkable.  She was given padding to wear but her symptoms have worsened.  She cannot bear full weight on that side of her foot.  She has a history of multiple other fractures.  She has never had a bone density test.  She does not smoke.  She has not had a vitamin D level checked.  She does not take acid blocking medication.               ROS:   All other systems were reviewed and are negative.  Objective: Vital Signs: LMP 03/05/2014   Physical Exam:  General:  Alert and oriented, in no acute distress. Pulm:  Breathing unlabored. Psy:  Normal mood, congruent affect. Skin: No erythema Left foot: She has tenderness to palpation at the distal fifth metatarsal, from dorsal, lateral and plantar approach.  She has pain around the fifth MTP joint.  Flexor and extensor tendon functions are intact.  Imaging:    Assessment & Plan: 1.  Ongoing left foot pain at the fifth MTP joint, cannot rule out stress fracture. -Short fracture boot, vitamin D, vitamin K2. -Return in 3 to 4 weeks for recheck.  If still having pain we might consider MRI scan at that point.     Procedures: No procedures performed  No notes on file     PMFS History: Patient Active Problem List    Diagnosis Date Noted  . Periumbilical abdominal pain 03/12/2020  . Diarrhea 03/12/2020  . Toe pain, chronic, left 02/28/2020  . GAD (generalized anxiety disorder) 01/02/2020  . Alcohol abuse 01/02/2020  . Hand injury, right, initial encounter 09/26/2019  . Palpitations 04/03/2019  . Seasonal allergies 01/16/2019  . Elevated LFTs 11/15/2018  . 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 Hyperlipidemia 07/08/2018  . Asthma 08/20/2017  . Preventative health care 06/21/2017  . Insomnia 04/16/2017  . Essential hypertension 04/16/2017   Past Medical History:  Diagnosis Date  . Acute right lower quadrant pain 06/10/2018  . Alcohol abuse   . Asthma   . Hypertension   . Insomnia   . Pain of upper abdomen 06/21/2017  . Palpitations    a. 05/2019 Echo: EF 60-65%, diast dysfxn; b. 05/2019 Zio: Sinus rhythm-sinus tach. Avg HR 105. 6 SVT episodes - longest 7 beats. Rare PVCs-->Diltiazem added.  Marland Kitchen Spongiotic dermatitis     Family History  Problem Relation Age of Onset  . Arthritis Mother   . Arthritis Father   . Lung cancer Father   . Heart attack Father   . Hyperlipidemia Maternal Grandmother   . Stroke Maternal Grandmother   .  Hypertension Maternal Grandmother   . Heart attack Maternal Grandmother   . Alcohol abuse Paternal Grandmother   . Arthritis Paternal Grandmother   . Mental illness Paternal Grandmother   . Breast cancer Maternal Aunt   . Epilepsy Brother     Past Surgical History:  Procedure Laterality Date  . CESAREAN SECTION    . FRACTURE SURGERY    . ROBOTIC ASSISTED TOTAL HYSTERECTOMY N/A 03/05/2014   Procedure: ROBOTIC ASSISTED TOTAL HYSTERECTOMY/BILATERAL SALPINGECTOMY;  Surgeon: Serita Kyle, MD;  Location: [facility];  Service: Gynecology;  Laterality: N/A;  . TUBAL LIGATION     Social History   Occupational History  . Not on file  Tobacco Use  . Smoking status: Never Smoker  . Smokeless tobacco: Never Used  Vaping Use  . Vaping Use: Never used  Substance and  Sexual Activity  . Alcohol use: Yes    Comment: occasion  . Drug use: No  . Sexual activity: Not on file

## 2020-04-10 NOTE — Telephone Encounter (Signed)
lmov

## 2020-04-10 NOTE — Telephone Encounter (Signed)
-----   Message from Jefferey Pica, RN sent at 04/09/2020  8:32 AM EDT ----- Patient was seen by JV on 7/29. She was scheduled for a lexiscan today, but was supposed to have follow up appt after to discuss.   Please call to arrange (probably could be virtual if the patient prefers).   Thanks!

## 2020-04-10 NOTE — Patient Instructions (Signed)
   Vitamin D3:  5,000 IU daily  Vitamin K2:  100 mcg daily  Magnesium:  400 mg daily   

## 2020-04-17 ENCOUNTER — Other Ambulatory Visit: Payer: Self-pay | Admitting: Family Medicine

## 2020-04-19 ENCOUNTER — Other Ambulatory Visit: Payer: Self-pay | Admitting: Primary Care

## 2020-04-19 DIAGNOSIS — F411 Generalized anxiety disorder: Secondary | ICD-10-CM

## 2020-04-19 NOTE — Telephone Encounter (Signed)
Message left for patient to return my call.  

## 2020-04-23 NOTE — Telephone Encounter (Signed)
Message left for patient to return my call.  

## 2020-04-23 NOTE — Telephone Encounter (Signed)
Spoke to pt. She said she never stopped taking the sertraline. It was a mistake by the person who removed it. I will send it in.

## 2020-04-23 NOTE — Telephone Encounter (Signed)
Pt returned your call Best number [phone]

## 2020-04-24 ENCOUNTER — Ambulatory Visit (INDEPENDENT_AMBULATORY_CARE_PROVIDER_SITE_OTHER): Payer: No Typology Code available for payment source | Admitting: Primary Care

## 2020-04-24 ENCOUNTER — Other Ambulatory Visit: Payer: Self-pay

## 2020-04-24 ENCOUNTER — Encounter: Payer: Self-pay | Admitting: Primary Care

## 2020-04-24 VITALS — BP 146/82 | HR 102 | Temp 96.2°F | Ht 64.0 in | Wt 122.5 lb

## 2020-04-24 DIAGNOSIS — R300 Dysuria: Secondary | ICD-10-CM

## 2020-04-24 DIAGNOSIS — N941 Unspecified dyspareunia: Secondary | ICD-10-CM

## 2020-04-24 LAB — POC URINALSYSI DIPSTICK (AUTOMATED)
Bilirubin, UA: NEGATIVE
Blood, UA: NEGATIVE
Glucose, UA: NEGATIVE
Ketones, UA: NEGATIVE
Leukocytes, UA: NEGATIVE
Nitrite, UA: NEGATIVE
Protein, UA: NEGATIVE
Spec Grav, UA: 1.015 (ref 1.010–1.025)
Urobilinogen, UA: 0.2 E.U./dL
pH, UA: 6 (ref 5.0–8.0)

## 2020-04-24 NOTE — Patient Instructions (Signed)
Continue to drink plenty of water.  We will be in touch Friday this week with your urine test result.  It was a pleasure to see you today!

## 2020-04-24 NOTE — Progress Notes (Signed)
Subjective:    Patient ID: Kelly Walton, female    DOB: 03-11-1971, 49 y.o.   MRN: [id number]  HPI  This visit occurred during the SARS-CoV-2 public health emergency.  Safety protocols were in place, including screening questions prior to the visit, additional usage of staff PPE, and extensive cleaning of exam room while observing appropriate contact time as indicated for disinfecting solutions.   Kelly Walton is a 49 year old female with a history of hypertension, alcohol abuse, hyperlipidemia, elevated LFT's, GAD who presents today with a chief complaint of urinary frequency.  She also endorses dysuria and left flank pain. She has a remote history of UTI during her teenage years, this feels similar. Symptoms began about four days ago. She's been taking AZO and drinking cranberry juice without much improvement.   Also with a 2 month history of painful intercourse. History of partial hysterectomy years ago.   She recently changed laundry detergents recently. She denies fevers, nausea, vaginal discharge/itching.   Review of Systems  Constitutional: Negative for fever.  Gastrointestinal: Negative for nausea.  Genitourinary: Positive for dysuria and frequency. Negative for hematuria and vaginal discharge.       Past Medical History:  Diagnosis Date  . Acute right lower quadrant pain 06/10/2018  . Alcohol abuse   . Asthma   . Hypertension   . Insomnia   . Pain of upper abdomen 06/21/2017  . Palpitations    a. 05/2019 Echo: EF 60-65%, diast dysfxn; b. 05/2019 Zio: Sinus rhythm-sinus tach. Avg HR 105. 6 SVT episodes - longest 7 beats. Rare PVCs-->Diltiazem added.  Marland Kitchen Spongiotic dermatitis      Social History   Socioeconomic History  . Marital status: Single    Spouse name: Not on file  . Number of children: Not on file  . Years of education: Not on file  . Highest education level: Not on file  Occupational History  . Not on file  Tobacco Use  . Smoking status: Never Smoker  .  Smokeless tobacco: Never Used  Vaping Use  . Vaping Use: Never used  Substance and Sexual Activity  . Alcohol use: Yes    Comment: occasion  . Drug use: No  . Sexual activity: Not on file  Other Topics Concern  . Not on file  Social History Narrative   Single.   2 children, 1 step-son.   Manager at [employer].   Enjoys going to [employer], spending time with family.    Social Determinants of Health   Financial Resource Strain:   . Difficulty of Paying Living Expenses:   Food Insecurity:   . Worried About Programme researcher, broadcasting/film/video in the Last Year:   . Barista in the Last Year:   Transportation Needs:   . Freight forwarder (Medical):   Marland Kitchen Lack of Transportation (Non-Medical):   Physical Activity:   . Days of Exercise per Week:   . Minutes of Exercise per Session:   Stress:   . Feeling of Stress :   Social Connections:   . Frequency of Communication with Friends and Family:   . Frequency of Social Gatherings with Friends and Family:   . Attends Religious Services:   . Active Member of Clubs or Organizations:   . Attends Banker Meetings:   Marland Kitchen Marital Status:   Intimate Partner Violence:   . Fear of Current or Ex-Partner:   . Emotionally Abused:   Marland Kitchen Physically Abused:   . Sexually Abused:  Past Surgical History:  Procedure Laterality Date  . CESAREAN SECTION    . FRACTURE SURGERY    . ROBOTIC ASSISTED TOTAL HYSTERECTOMY N/A 03/05/2014   Procedure: ROBOTIC ASSISTED TOTAL HYSTERECTOMY/BILATERAL SALPINGECTOMY;  Surgeon: Serita Kyle, MD;  Location: [facility];  Service: Gynecology;  Laterality: N/A;  . TUBAL LIGATION      Family History  Problem Relation Age of Onset  . Arthritis Mother   . Arthritis Father   . Lung cancer Father   . Heart attack Father   . Hyperlipidemia Maternal Grandmother   . Stroke Maternal Grandmother   . Hypertension Maternal Grandmother   . Heart attack Maternal Grandmother   . Alcohol abuse Paternal Grandmother   .  Arthritis Paternal Grandmother   . Mental illness Paternal Grandmother   . Breast cancer Maternal Aunt   . Epilepsy Brother     Allergies  Allergen Reactions  . Promethazine Hcl Hives and Rash  . Sulfa Antibiotics Hives and Rash    Pt states sent her to the hospital.  . Trazodone And Nefazodone     Hallucinations     Current Outpatient Medications on File Prior to Visit  Medication Sig Dispense Refill  . albuterol (VENTOLIN HFA) 108 (90 Base) MCG/ACT inhaler Inhale 2 puffs into the lungs every 6 (six) hours as needed for wheezing or shortness of breath. 8 g 0  . diclofenac Sodium (VOLTAREN) 1 % GEL Apply 4 g topically 4 (four) times daily as needed. 500 g 6  . diltiazem (CARDIZEM CD) 180 MG 24 hr capsule Take 1 capsule (180 mg total) by mouth daily.    . hydrOXYzine (ATARAX/VISTARIL) 10 MG tablet TAKE 1-2 TABLETS (10-20 MG TOTAL) BY MOUTH AT BEDTIME AS NEEDED. 180 tablet 0  . montelukast (SINGULAIR) 10 MG tablet TAKE 1 TABLET (10 MG TOTAL) BY MOUTH AT BEDTIME. FOR ALLERGIES/ASTHMA. 90 tablet 3  . sertraline (ZOLOFT) 50 MG tablet TAKE 1 TABLET (50 MG TOTAL) BY MOUTH DAILY. FOR ANXIETY. 90 tablet 0  . TIADYLT ER 180 MG 24 hr capsule TAKE 1 CAPSULE BY MOUTH EVERY DAY 90 capsule 3  . tiZANidine (ZANAFLEX) 2 MG tablet TAKE 1-2 TABLETS (2-4 MG TOTAL) BY MOUTH EVERY 6 (SIX) HOURS AS NEEDED FOR MUSCLE SPASMS. 60 tablet 1  . [DISCONTINUED] tiZANidine (ZANAFLEX) 2 MG tablet Take 1-2 tablets (2-4 mg total) by mouth every 6 (six) hours as needed for muscle spasms. 60 tablet 1  . [DISCONTINUED] tiZANidine (ZANAFLEX) 2 MG tablet TAKE 1-2 TABLETS (2-4 MG TOTAL) BY MOUTH EVERY 6 (SIX) HOURS AS NEEDED FOR MUSCLE SPASMS. 60 tablet 1   No current facility-administered medications on file prior to visit.    BP (!) 146/82   Pulse (!) 102   Temp (!) 96.2 F (35.7 C) (Temporal)   Ht 5\' 4"  (1.626 m)   Wt 122 lb 8 oz (55.6 kg)   LMP 03/05/2014   SpO2 100%   BMI 21.03 kg/m    Objective:   Physical  Exam Cardiovascular:     Rate and Rhythm: Normal rate and regular rhythm.  Pulmonary:     Effort: Pulmonary effort is normal.     Breath sounds: Normal breath sounds.  Musculoskeletal:     Cervical back: Neck supple.  Skin:    General: Skin is warm and dry.            Assessment & Plan:  Dysuria:  Acute with frequency and flank pain x 4 days.  No vaginal symptoms except  for dyspareunia. Discussed vaginal moisturizers.  UA today negative. Culture sent, await results.  Continue to push water intake.  Doreene Nest, NP

## 2020-04-24 NOTE — Assessment & Plan Note (Signed)
UA negative, sending off culture.  This could very well be hormonal given history of partial hysterectomy years ago.  Discussed use of vaginal moisturizers. Consider Estrace cream. She will update.

## 2020-04-26 LAB — URINE CULTURE
MICRO NUMBER:: 10842184
SPECIMEN QUALITY:: ADEQUATE

## 2020-04-28 ENCOUNTER — Other Ambulatory Visit: Payer: Self-pay | Admitting: Primary Care

## 2020-04-28 DIAGNOSIS — N3000 Acute cystitis without hematuria: Secondary | ICD-10-CM

## 2020-04-28 MED ORDER — CEPHALEXIN 500 MG PO CAPS: 500.0000 mg | ORAL_CAPSULE | Freq: Two times a day (BID) | ORAL | 0 refills | Status: AC

## 2020-05-01 ENCOUNTER — Other Ambulatory Visit: Payer: Self-pay | Admitting: Family Medicine

## 2020-05-03 ENCOUNTER — Other Ambulatory Visit: Payer: Self-pay | Admitting: Primary Care

## 2020-05-03 DIAGNOSIS — J4520 Mild intermittent asthma, uncomplicated: Secondary | ICD-10-CM

## 2020-05-03 NOTE — Telephone Encounter (Signed)
Pharmacy requests refill on: Albuterol inhaler   LAST REFILL:  04/09/20 LAST OV:  04/24/20 (acute burning with urination) NEXT OV: Nothing Scheduled  PHARMACY: [facility]  Increased use due to having to wear mask again at work.   Will pend for PCP approval.

## 2020-05-05 NOTE — Telephone Encounter (Signed)
She should not be using her albuterol daily, this means that she needs better treatment as this is only meant to be used as a rescue inhaler.   Is she willing to start Flovent BID for better control of symptoms? Needs to rinse mouth after each use. I cannot continue to prescribe albuterol monthly if she's struggling with asthma symptoms.   Let me know.

## 2020-05-06 NOTE — Telephone Encounter (Signed)
I tried calling and sent a mychart message.  I figured if it was denied she would contact the office.

## 2020-05-06 NOTE — Telephone Encounter (Signed)
Kelly Walton, did you speak with her?

## 2020-05-08 NOTE — Telephone Encounter (Signed)
Attempted to schedule no ans no vm  

## 2020-05-28 NOTE — Telephone Encounter (Signed)
LMOV  

## 2020-06-05 ENCOUNTER — Other Ambulatory Visit: Payer: Self-pay | Admitting: Primary Care

## 2020-06-05 DIAGNOSIS — G4700 Insomnia, unspecified: Secondary | ICD-10-CM

## 2020-06-11 ENCOUNTER — Ambulatory Visit (INDEPENDENT_AMBULATORY_CARE_PROVIDER_SITE_OTHER)
Admission: RE | Admit: 2020-06-11 | Discharge: 2020-06-11 | Disposition: A | Discharge status: Home / Self Care | Payer: No Typology Code available for payment source | Source: Ambulatory Visit | Attending: Internal Medicine | Admitting: Internal Medicine

## 2020-06-11 ENCOUNTER — Ambulatory Visit (INDEPENDENT_AMBULATORY_CARE_PROVIDER_SITE_OTHER): Payer: No Typology Code available for payment source | Admitting: Internal Medicine

## 2020-06-11 ENCOUNTER — Other Ambulatory Visit: Payer: Self-pay

## 2020-06-11 ENCOUNTER — Encounter: Payer: Self-pay | Admitting: Orthopaedic Surgery

## 2020-06-11 ENCOUNTER — Ambulatory Visit (INDEPENDENT_AMBULATORY_CARE_PROVIDER_SITE_OTHER): Payer: No Typology Code available for payment source | Admitting: Orthopaedic Surgery

## 2020-06-11 ENCOUNTER — Encounter: Payer: Self-pay | Admitting: Internal Medicine

## 2020-06-11 VITALS — BP 138/84 | HR 58 | Temp 97.0°F

## 2020-06-11 VITALS — BP 80/55 | HR 64 | Ht 64.0 in | Wt 123.0 lb

## 2020-06-11 DIAGNOSIS — R202 Paresthesia of skin: Secondary | ICD-10-CM

## 2020-06-11 DIAGNOSIS — Z8781 Personal history of (healed) traumatic fracture: Secondary | ICD-10-CM | POA: Insufficient documentation

## 2020-06-11 DIAGNOSIS — M25511 Pain in right shoulder: Secondary | ICD-10-CM

## 2020-06-11 DIAGNOSIS — M79631 Pain in right forearm: Secondary | ICD-10-CM

## 2020-06-11 DIAGNOSIS — S42031A Displaced fracture of lateral end of right clavicle, initial encounter for closed fracture: Secondary | ICD-10-CM

## 2020-06-11 DIAGNOSIS — H539 Unspecified visual disturbance: Secondary | ICD-10-CM

## 2020-06-11 DIAGNOSIS — S50819A Abrasion of unspecified forearm, initial encounter: Secondary | ICD-10-CM

## 2020-06-11 DIAGNOSIS — M898X1 Other specified disorders of bone, shoulder: Secondary | ICD-10-CM | POA: Diagnosis not present

## 2020-06-11 DIAGNOSIS — W19XXXA Unspecified fall, initial encounter: Secondary | ICD-10-CM

## 2020-06-11 HISTORY — DX: Personal history of (healed) traumatic fracture: Z87.81

## 2020-06-11 MED ORDER — KETOROLAC TROMETHAMINE 30 MG/ML IJ SOLN
30.0000 mg | Freq: Once | INTRAMUSCULAR | Status: AC
  Administered 2020-06-11: 30 mg via INTRAMUSCULAR

## 2020-06-11 MED ORDER — HYDROCODONE-ACETAMINOPHEN 5-325 MG PO TABS
1.0000 | ORAL_TABLET | Freq: Four times a day (QID) | ORAL | 0 refills | Status: DC | PRN
Start: 2020-06-11 — End: 2020-06-17

## 2020-06-11 NOTE — Progress Notes (Signed)
Office Visit Note   Patient: Kelly Walton           Date of Birth: 1971/06/29           MRN: [id number] Visit Date: 06/11/2020              Requested by: Doreene Nest, NP [address] [place],  [place] [address] PCP: Doreene Nest, NP   Assessment & Plan: Visit Diagnoses:  1. Traumatic closed fracture of distal clavicle with minimal displacement, right, initial encounter     Plan: Impression is displaced right distal clavicle fracture with disruption of CC ligaments.  I reviewed the x-rays and the injury with Zenab and her husband in detail and my recommendation is for surgical repair given the likelihood of nonunion and chronic pain and shoulder dysfunction.  Based on discussion she has elected to proceed with surgical repair in the near future.  Risk benefits rehab recovery reviewed in detail.  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  Questions encouraged and answered.  Follow-Up Instructions: Return for 2 week postop.   Orders:  No orders of the defined types were placed in this encounter.  No orders of the defined types were placed in this encounter.     Procedures: No procedures performed   Clinical Data: No additional findings.   Subjective: Chief Complaint  Patient presents with  . Right Shoulder - Pain  Patient presents today for her right shoulder. She states that she got up this morning and tripped and fell. She thought she was okay, but then realized upon getting up that she had pain in the right clavicle area. She said that the pain also moves into her arm. She went to her PCP this morning and had x-rays taken of her arm, shoulder, and clavicle. She was given hydrocodone 5/325 to take for pain and has taken 1/2 a tablet before coming in today. She said that the area is swollen and hurts constantly. She was given a sling to wear at her PCP this morning. She also states that she had a very  difficult time seeing anything since she fell, but has started to notice improvement. She also states that her body "tingles" all over since the fall. She is right hand dominant.   Kelly Walton is a very pleasant 49 year old female referral from Dr. Cleophas Dunker for injury this morning when she had a mechanical fall onto her right shoulder.  She presented to the PCP x-ray showed a distal clavicle fracture with displacement and disruption of the CC ligaments.   Review of Systems  Constitutional: Negative for fatigue.  HENT: Negative for ear pain.   Eyes: Negative for pain.  Respiratory: Negative for shortness of breath.   Cardiovascular: Negative for leg swelling.  Gastrointestinal: Negative for constipation and diarrhea.  Endocrine: Negative for cold intolerance and heat intolerance.  Genitourinary: Negative for difficulty urinating.  Musculoskeletal: Negative for joint swelling.  Skin: Negative for rash.  Allergic/Immunologic: Negative for food allergies.  Neurological: Negative for weakness.  Hematological: Does not bruise/bleed easily.  Psychiatric/Behavioral: Negative for sleep disturbance.     Objective: Vital Signs: BP (!) 80/55   Pulse 64   Ht 5\' 4"  (1.626 m)   Wt 123 lb (55.8 kg)   LMP 03/05/2014   BMI 21.11 kg/m   Physical Exam Vitals and nursing note reviewed.  Constitutional:  Appearance: She is well-developed.  HENT:     Head: Normocephalic and atraumatic.  Pulmonary:     Effort: Pulmonary effort is normal.  Abdominal:     Palpations: Abdomen is soft.  Musculoskeletal:     Cervical back: Neck supple.  Skin:    General: Skin is warm.     Capillary Refill: Capillary refill takes less than 2 seconds.  Neurological:     Mental Status: She is alert and oriented to person, place, and time.  Psychiatric:        Behavior: Behavior normal.        Thought Content: Thought content normal.        Judgment: Judgment normal.     Ortho Exam Right shoulder shows no skin  compromise.  Neurovascular intact distally.  Tenderness throughout the distal clavicle region.  There is mild swelling and bruising. Specialty Comments:  No specialty comments available.  Imaging: No results found.   PMFS History: Patient Active Problem List   Diagnosis Date Noted  . Traumatic closed fracture of distal clavicle with minimal displacement, right, initial encounter 06/11/2020  . Dyspareunia, female 04/24/2020  . Periumbilical abdominal pain 03/12/2020  . Diarrhea 03/12/2020  . Toe pain, chronic, left 02/28/2020  . GAD (generalized anxiety disorder) 01/02/2020  . Alcohol abuse 01/02/2020  . Hand injury, right, initial encounter 09/26/2019  . Palpitations 04/03/2019  . Seasonal allergies 01/16/2019  . Elevated LFTs 11/15/2018  . 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 Hyperlipidemia 07/08/2018  . Asthma 08/20/2017  . Preventative health care 06/21/2017  . Insomnia 04/16/2017  . Essential hypertension 04/16/2017   Past Medical History:  Diagnosis Date  . Acute right lower quadrant pain 06/10/2018  . Alcohol abuse   . Asthma   . Hypertension   . Insomnia   . Pain of upper abdomen 06/21/2017  . Palpitations    a. 05/2019 Echo: EF 60-65%, diast dysfxn; b. 05/2019 Zio: Sinus rhythm-sinus tach. Avg HR 105. 6 SVT episodes - longest 7 beats. Rare PVCs-->Diltiazem added.  Marland Kitchen Spongiotic dermatitis     Family History  Problem Relation Age of Onset  . Arthritis Mother   . Arthritis Father   . Lung cancer Father   . Heart attack Father   . Hyperlipidemia Maternal Grandmother   . Stroke Maternal Grandmother   . Hypertension Maternal Grandmother   . Heart attack Maternal Grandmother   . Alcohol abuse Paternal Grandmother   . Arthritis Paternal Grandmother   . Mental illness Paternal Grandmother   . Breast cancer Maternal Aunt   . Epilepsy Brother     Past Surgical History:  Procedure Laterality Date  . CESAREAN SECTION    . FRACTURE SURGERY    . ROBOTIC ASSISTED  TOTAL HYSTERECTOMY N/A 03/05/2014   Procedure: ROBOTIC ASSISTED TOTAL HYSTERECTOMY/BILATERAL SALPINGECTOMY;  Surgeon: Serita Kyle, MD;  Location: [facility];  Service: Gynecology;  Laterality: N/A;  . TUBAL LIGATION     Social History   Occupational History  . Not on file  Tobacco Use  . Smoking status: Never Smoker  . Smokeless tobacco: Never Used  Vaping Use  . Vaping Use: Never used  Substance and Sexual Activity  . Alcohol use: Yes    Comment: occasion  . Drug use: No  . Sexual activity: Not on file

## 2020-06-11 NOTE — Patient Instructions (Signed)
Clavicle Fracture  A clavicle fracture is a broken collarbone. The collarbone is the long bone that connects your shoulder to your chest wall. A broken collarbone may be treated with a sling or with surgery. Treatment depends on whether the broken ends of the bone are out of place or not. Follow these instructions at home: If you have a sling:  Wear the sling as told by your doctor. Take it off only as told by your doctor.  Loosen the sling if your fingers tingle, become numb, or turn cold and blue.  Do not lift your arm. Keep it across your chest.  Keep the sling clean.  Ask your doctor if you may take off the sling for bathing. ? If your sling is not waterproof, do not let it get wet. Cover the sling with a watertight covering if you take a bath or a shower while wearing it. ? If you may take off your sling when you take a bath or a shower, keep your shoulder in the same position as when the sling is on. Managing pain, stiffness, and swelling   If told, put ice on the injured area: ? If you have a removable sling, take it off as told by your doctor. ? Put ice in a plastic bag. ? Place a towel between your skin and the bag. ? Leave the ice on for 20 minutes, 2-3 times a day. Activity  Avoid activities that make your symptoms worse for 4-6 weeks, or as long as told.  Ask your doctor when it is safe for you to drive.  Do exercises as told by your doctor. General instructions  Do not use any products that contain nicotine or tobacco, such as cigarettes and e-cigarettes. These can delay bone healing. If you need help quitting, ask your doctor.  Take over-the-counter and prescription medicines only as told by your doctor.  Keep all follow-up visits as told by your doctor. This is important. Contact a doctor if:  Your medicine is not making you feel less pain.  Your medicine is not making swelling better. Get help right away if:  Your cannot feel your arm (your arm is  numb).  Your arm is cold.  Your arm is a lighter color than normal. Summary  A clavicle fracture is a broken collarbone. The collarbone is the long bone that connects your shoulder to your chest wall.  Treatment depends on whether the broken ends of the bone are out of place or not.  If you have a sling, wear it as told by your doctor.  Do exercises when your doctor says you can. The exercises will help your arm get strong and move like it used to. This information is not intended to replace advice given to you by your health care provider. Make sure you discuss any questions you have with your health care provider. Document Revised: 08/06/2017 Document Reviewed: 07/13/2016 Elsevier Patient Education  2020 [employer].

## 2020-06-11 NOTE — Progress Notes (Signed)
Subjective:    Patient ID: Kelly Walton, female    DOB: April 09, 1971, 49 y.o.   MRN: [id number]  HPI  Pt presents to the clinic today with c/o right shoulder pain, pain in her right collerbone and right forearm. This started after she fell, tripping over her dog at 5 am this morning. She describes the pain as sharp and stabbing. The pain radiates down her right arm. She reports associated numbness and tingling of her upper and lower extremities. She reports history of chronic neck and back pain, not worse than usual. She reports some dizziness and visual changes, but is unsure if she hit her head or not. She describes the dizziness as a sense of imbalance, worse with position changes. She reports the vision changes as seeing spots. She took some Excedrin migraine with minimal relief of symptoms.    Review of Systems      Past Medical History:  Diagnosis Date  . Acute right lower quadrant pain 06/10/2018  . Alcohol abuse   . Asthma   . Hypertension   . Insomnia   . Pain of upper abdomen 06/21/2017  . Palpitations    a. 05/2019 Echo: EF 60-65%, diast dysfxn; b. 05/2019 Zio: Sinus rhythm-sinus tach. Avg HR 105. 6 SVT episodes - longest 7 beats. Rare PVCs-->Diltiazem added.  Marland Kitchen Spongiotic dermatitis     Current Outpatient Medications  Medication Sig Dispense Refill  . albuterol (VENTOLIN HFA) 108 (90 Base) MCG/ACT inhaler Inhale 2 puffs into the lungs every 6 (six) hours as needed for wheezing or shortness of breath. 8 g 0  . diclofenac Sodium (VOLTAREN) 1 % GEL Apply 4 g topically 4 (four) times daily as needed. 500 g 6  . diltiazem (CARDIZEM CD) 180 MG 24 hr capsule Take 1 capsule (180 mg total) by mouth daily.    . hydrOXYzine (ATARAX/VISTARIL) 10 MG tablet TAKE 1-2 TABLETS (10-20 MG TOTAL) BY MOUTH AT BEDTIME AS NEEDED. 180 tablet 0  . montelukast (SINGULAIR) 10 MG tablet TAKE 1 TABLET (10 MG TOTAL) BY MOUTH AT BEDTIME. FOR ALLERGIES/ASTHMA. 90 tablet 3  . sertraline (ZOLOFT) 50 MG  tablet TAKE 1 TABLET (50 MG TOTAL) BY MOUTH DAILY. FOR ANXIETY. 90 tablet 0  . TIADYLT ER 180 MG 24 hr capsule TAKE 1 CAPSULE BY MOUTH EVERY DAY 90 capsule 3  . tiZANidine (ZANAFLEX) 2 MG tablet TAKE 1-2 TABLETS (2-4 MG TOTAL) BY MOUTH EVERY 6 (SIX) HOURS AS NEEDED FOR MUSCLE SPASMS. 360 tablet 1   No current facility-administered medications for this visit.    Allergies  Allergen Reactions  . Promethazine Hcl Hives and Rash  . Sulfa Antibiotics Hives and Rash    Pt states sent her to the hospital.  . Trazodone And Nefazodone     Hallucinations     Family History  Problem Relation Age of Onset  . Arthritis Mother   . Arthritis Father   . Lung cancer Father   . Heart attack Father   . Hyperlipidemia Maternal Grandmother   . Stroke Maternal Grandmother   . Hypertension Maternal Grandmother   . Heart attack Maternal Grandmother   . Alcohol abuse Paternal Grandmother   . Arthritis Paternal Grandmother   . Mental illness Paternal Grandmother   . Breast cancer Maternal Aunt   . Epilepsy Brother     Social History   Socioeconomic History  . Marital status: Single    Spouse name: Not on file  . Number of children: Not on file  .  Years of education: Not on file  . Highest education level: Not on file  Occupational History  . Not on file  Tobacco Use  . Smoking status: Never Smoker  . Smokeless tobacco: Never Used  Vaping Use  . Vaping Use: Never used  Substance and Sexual Activity  . Alcohol use: Yes    Comment: occasion  . Drug use: No  . Sexual activity: Not on file  Other Topics Concern  . Not on file  Social History Narrative   Single.   2 children, 1 step-son.   Manager at [employer].   Enjoys going to [employer], spending time with family.    Social Determinants of Health   Financial Resource Strain:   . Difficulty of Paying Living Expenses: Not on file  Food Insecurity:   . Worried About Programme researcher, broadcasting/film/video in the Last Year: Not on file  . Ran Out of Food in the  Last Year: Not on file  Transportation Needs:   . Lack of Transportation (Medical): Not on file  . Lack of Transportation (Non-Medical): Not on file  Physical Activity:   . Days of Exercise per Week: Not on file  . Minutes of Exercise per Session: Not on file  Stress:   . Feeling of Stress : Not on file  Social Connections:   . Frequency of Communication with Friends and Family: Not on file  . Frequency of Social Gatherings with Friends and Family: Not on file  . Attends Religious Services: Not on file  . Active Member of Clubs or Organizations: Not on file  . Attends Banker Meetings: Not on file  . Marital Status: Not on file  Intimate Partner Violence:   . Fear of Current or Ex-Partner: Not on file  . Emotionally Abused: Not on file  . Physically Abused: Not on file  . Sexually Abused: Not on file     Constitutional: Denies fever, malaise, fatigue, headache or abrupt weight changes.  HEENT: Pt reports vision changes. Denies eye pain, eye redness, ear pain, ringing in the ears, wax buildup, runny nose, nasal congestion, bloody nose, or sore throat. Respiratory: Denies difficulty breathing, shortness of breath, cough or sputum production.   Cardiovascular: Denies chest pain, chest tightness, palpitations or swelling in the hands or feet.  Musculoskeletal: Pt reports chronic neck and back pain, right clavicular pain, right shoulder pain and right forearm pain. Denies difficulty with gait, muscle pain or joint swelling.  Skin: Pt reports abrasion to right forearm. Denies redness, rashes, lesions or ulcercations.  Neurological: Pt reports dizziness, numbness and tingling of upper and lower extremities. Denies difficulty with memory, difficulty with speech or problems with balance and coordination.    No other specific complaints in a complete review of systems (except as listed in HPI above).  Objective:   Physical Exam  BP 138/84   Pulse (!) 58   Temp (!) 97 F  (36.1 C) (Temporal)   LMP 03/05/2014   Wt Readings from Last 3 Encounters:  04/24/20 122 lb 8 oz (55.6 kg)  04/04/20 120 lb (54.4 kg)  03/12/20 120 lb (54.4 kg)    General: Appears her stated age, well developed, well nourished in NAD. Skin: Abrasion noted of right medial forearm. HEENT: Head: normal shape and size; Eyes: sclera white, no icterus, conjunctiva pink, PERRLA and EOMs intact;  Cardiovascular: Normal rate and rhythm. S1,S2 noted.  No murmur, rubs or gallops noted. Radial pulses 2+ on the right. Pulmonary/Chest: Normal effort and  positive vesicular breath sounds. No respiratory distress. No wheezes, rales or ronchi noted.  Musculoskeletal: Normal flexion of the cervical spine. Decreased extension and rotation secondary to pain. Bony step off noted of the lateral clavicle. She refused ROM of the right shoulder secondary to pain. Pain with palpation over the clavicle, entire right shoulder and proximal ulna. Strength decreased in RUE. Hand grips unequal L>R. Neurological: Alert and oriented.    BMET    Component Value Date/Time   NA 136 03/12/2020 0925   NA 141 05/11/2019 0916   K 3.7 03/12/2020 0925   CL 98 03/12/2020 0925   CO2 27 03/12/2020 0925   GLUCOSE 89 03/12/2020 0925   BUN 9 03/12/2020 0925   BUN 14 05/11/2019 0916   CREATININE 0.88 03/12/2020 0925   CALCIUM 9.6 03/12/2020 0925   GFRNONAA >60 09/02/2019 2223   GFRAA >60 09/02/2019 2223    Lipid Panel     Component Value Date/Time   CHOL 240 (H) 10/10/2019 1002   TRIG 209.0 (H) 10/10/2019 1002   HDL 99.10 10/10/2019 1002   CHOLHDL 2 10/10/2019 1002   VLDL 41.8 (H) 10/10/2019 1002   LDLCALC 116 (H) 06/21/2017 1107    CBC    Component Value Date/Time   WBC 4.1 03/12/2020 0925   RBC 4.47 03/12/2020 0925   HGB 13.5 03/12/2020 0925   HGB 13.6 05/11/2019 0916   HCT 39.8 03/12/2020 0925   HCT 40.2 05/11/2019 0916   PLT 180.0 03/12/2020 0925   PLT 305 05/11/2019 0916   MCV 88.9 03/12/2020 0925    MCV 89 05/11/2019 0916   MCH 31.2 11/30/2019 1131   MCHC 34.1 03/12/2020 0925   RDW 15.1 03/12/2020 0925   RDW 13.4 05/11/2019 0916   LYMPHSABS 1.2 03/12/2020 0925   LYMPHSABS 1.7 05/11/2019 0916   MONOABS 0.3 03/12/2020 0925   EOSABS 0.1 03/12/2020 0925   EOSABS 0.0 05/11/2019 0916   BASOSABS 0.0 03/12/2020 0925   BASOSABS 0.0 05/11/2019 0916    Hgb A1C Lab Results  Component Value Date   HGBA1C 5.2 07/01/2018            Assessment & Plan:   Right Clavicle Pain, Right Shoulder Pain, Right Forearm Pain, Abrasion Right Forearm:  Toradol 30 mg IM today Vision exam Xray right shoulder Xray right clavicle shows clavicular fracture Xray right forearm RX for Hydrocodone 5-325 mg Q6H prn Sling provided Urgent referral to orthopedics for further evaluation  Return precautions discussed Nicki Reaper, NP This visit occurred during the SARS-CoV-2 public health emergency.  Safety protocols were in place, including screening questions prior to the visit, additional usage of staff PPE, and extensive cleaning of exam room while observing appropriate contact time as indicated for disinfecting solutions.

## 2020-06-11 NOTE — Addendum Note (Signed)
Addended by: Roena Malady on: 06/11/2020 03:04 PM   Modules accepted: Orders

## 2020-06-12 ENCOUNTER — Encounter (HOSPITAL_BASED_OUTPATIENT_CLINIC_OR_DEPARTMENT_OTHER): Payer: Self-pay | Admitting: Orthopaedic Surgery

## 2020-06-12 ENCOUNTER — Other Ambulatory Visit: Payer: Self-pay

## 2020-06-13 ENCOUNTER — Inpatient Hospital Stay (HOSPITAL_COMMUNITY): Admission: RE | Admit: 2020-06-13 | Payer: No Typology Code available for payment source | Source: Ambulatory Visit

## 2020-06-14 ENCOUNTER — Other Ambulatory Visit (HOSPITAL_COMMUNITY)
Admission: RE | Admit: 2020-06-14 | Discharge: 2020-06-14 | Disposition: A | Discharge status: Home / Self Care | Payer: No Typology Code available for payment source | Source: Ambulatory Visit | Attending: Orthopaedic Surgery | Admitting: Orthopaedic Surgery

## 2020-06-14 ENCOUNTER — Telehealth: Payer: Self-pay | Admitting: Orthopaedic Surgery

## 2020-06-14 DIAGNOSIS — Z20822 Contact with and (suspected) exposure to covid-19: Secondary | ICD-10-CM | POA: Insufficient documentation

## 2020-06-14 DIAGNOSIS — Z01812 Encounter for preprocedural laboratory examination: Secondary | ICD-10-CM | POA: Insufficient documentation

## 2020-06-14 LAB — SARS CORONAVIRUS 2 (TAT 6-24 HRS): SARS Coronavirus 2: NEGATIVE

## 2020-06-14 NOTE — Progress Notes (Signed)

## 2020-06-14 NOTE — Telephone Encounter (Signed)
Pt called wanting to know if we would send her in something for pain until her surgery on Monday?  [phone]

## 2020-06-16 ENCOUNTER — Other Ambulatory Visit: Payer: Self-pay | Admitting: Primary Care

## 2020-06-16 DIAGNOSIS — I10 Essential (primary) hypertension: Secondary | ICD-10-CM

## 2020-06-17 ENCOUNTER — Ambulatory Visit (HOSPITAL_BASED_OUTPATIENT_CLINIC_OR_DEPARTMENT_OTHER): Payer: No Typology Code available for payment source | Admitting: Anesthesiology

## 2020-06-17 ENCOUNTER — Encounter (HOSPITAL_BASED_OUTPATIENT_CLINIC_OR_DEPARTMENT_OTHER)
Admission: RE | Disposition: A | Discharge status: Home / Self Care | Payer: Self-pay | Source: Home / Self Care | Attending: Orthopaedic Surgery

## 2020-06-17 ENCOUNTER — Encounter (HOSPITAL_BASED_OUTPATIENT_CLINIC_OR_DEPARTMENT_OTHER): Payer: Self-pay | Admitting: Orthopaedic Surgery

## 2020-06-17 ENCOUNTER — Ambulatory Visit (HOSPITAL_COMMUNITY): Payer: No Typology Code available for payment source

## 2020-06-17 ENCOUNTER — Ambulatory Visit (HOSPITAL_BASED_OUTPATIENT_CLINIC_OR_DEPARTMENT_OTHER)
Admission: RE | Admit: 2020-06-17 | Discharge: 2020-06-17 | Disposition: A | Discharge status: Home / Self Care | Payer: No Typology Code available for payment source | Attending: Orthopaedic Surgery | Admitting: Orthopaedic Surgery

## 2020-06-17 ENCOUNTER — Other Ambulatory Visit: Payer: Self-pay

## 2020-06-17 DIAGNOSIS — J45909 Unspecified asthma, uncomplicated: Secondary | ICD-10-CM | POA: Insufficient documentation

## 2020-06-17 DIAGNOSIS — Z79899 Other long term (current) drug therapy: Secondary | ICD-10-CM | POA: Diagnosis not present

## 2020-06-17 DIAGNOSIS — Z882 Allergy status to sulfonamides status: Secondary | ICD-10-CM | POA: Diagnosis not present

## 2020-06-17 DIAGNOSIS — Z419 Encounter for procedure for purposes other than remedying health state, unspecified: Secondary | ICD-10-CM

## 2020-06-17 DIAGNOSIS — S42031A Displaced fracture of lateral end of right clavicle, initial encounter for closed fracture: Secondary | ICD-10-CM

## 2020-06-17 DIAGNOSIS — I10 Essential (primary) hypertension: Secondary | ICD-10-CM | POA: Diagnosis not present

## 2020-06-17 DIAGNOSIS — X58XXXA Exposure to other specified factors, initial encounter: Secondary | ICD-10-CM | POA: Diagnosis not present

## 2020-06-17 DIAGNOSIS — Z8781 Personal history of (healed) traumatic fracture: Secondary | ICD-10-CM | POA: Diagnosis present

## 2020-06-17 HISTORY — PX: ORIF CLAVICULAR FRACTURE: SHX5055

## 2020-06-17 SURGERY — OPEN REDUCTION INTERNAL FIXATION (ORIF) CLAVICULAR FRACTURE
Anesthesia: General | Site: Shoulder | Laterality: Right

## 2020-06-17 MED ORDER — ZINC SULFATE 220 (50 ZN) MG PO CAPS: 220.0000 mg | ORAL_CAPSULE | Freq: Every day | ORAL | 0 refills | Status: DC

## 2020-06-17 MED ORDER — CALCIUM CARBONATE-VITAMIN D 500-200 MG-UNIT PO TABS: 1.0000 | ORAL_TABLET | Freq: Three times a day (TID) | ORAL | 6 refills | Status: DC

## 2020-06-17 MED ORDER — EPHEDRINE SULFATE 50 MG/ML IJ SOLN
INTRAMUSCULAR | Status: DC | PRN
  Administered 2020-06-17: 5 mg via INTRAVENOUS

## 2020-06-17 MED ORDER — OXYCODONE HCL 5 MG PO TABS
ORAL_TABLET | ORAL | Status: AC
  Filled 2020-06-17: qty 1

## 2020-06-17 MED ORDER — AMISULPRIDE (ANTIEMETIC) 5 MG/2ML IV SOLN: 10.0000 mg | Freq: Once | INTRAVENOUS | Status: DC | PRN

## 2020-06-17 MED ORDER — PROPOFOL 10 MG/ML IV BOLUS
INTRAVENOUS | Status: AC
  Filled 2020-06-17: qty 20

## 2020-06-17 MED ORDER — SUGAMMADEX SODIUM 200 MG/2ML IV SOLN
INTRAVENOUS | Status: DC | PRN
  Administered 2020-06-17: 125 mg via INTRAVENOUS

## 2020-06-17 MED ORDER — ONDANSETRON HCL 4 MG/2ML IJ SOLN: 4.0000 mg | Freq: Once | INTRAMUSCULAR | Status: DC | PRN

## 2020-06-17 MED ORDER — BUPIVACAINE HCL (PF) 0.5 % IJ SOLN
INTRAMUSCULAR | Status: DC | PRN
  Administered 2020-06-17: 15 mL via PERINEURAL

## 2020-06-17 MED ORDER — OXYCODONE HCL 5 MG PO TABS
5.0000 mg | ORAL_TABLET | Freq: Once | ORAL | Status: AC | PRN
  Administered 2020-06-17: 5 mg via ORAL

## 2020-06-17 MED ORDER — MIDAZOLAM HCL 2 MG/2ML IJ SOLN
INTRAMUSCULAR | Status: AC
  Filled 2020-06-17: qty 2

## 2020-06-17 MED ORDER — DEXAMETHASONE SODIUM PHOSPHATE 4 MG/ML IJ SOLN
INTRAMUSCULAR | Status: DC | PRN
  Administered 2020-06-17: 10 mg via INTRAVENOUS

## 2020-06-17 MED ORDER — BUPIVACAINE LIPOSOME 1.3 % IJ SUSP
INTRAMUSCULAR | Status: DC | PRN
  Administered 2020-06-17: 10 mL

## 2020-06-17 MED ORDER — LACTATED RINGERS IV SOLN: INTRAVENOUS | Status: DC

## 2020-06-17 MED ORDER — OXYCODONE HCL ER 10 MG PO T12A: 10.0000 mg | EXTENDED_RELEASE_TABLET | Freq: Two times a day (BID) | ORAL | 0 refills | Status: AC

## 2020-06-17 MED ORDER — ONDANSETRON HCL 4 MG/2ML IJ SOLN
INTRAMUSCULAR | Status: AC
  Filled 2020-06-17: qty 2

## 2020-06-17 MED ORDER — FENTANYL CITRATE (PF) 100 MCG/2ML IJ SOLN
25.0000 ug | INTRAMUSCULAR | Status: DC | PRN
  Administered 2020-06-17 (×2): 25 ug via INTRAVENOUS

## 2020-06-17 MED ORDER — FENTANYL CITRATE (PF) 100 MCG/2ML IJ SOLN
INTRAMUSCULAR | Status: AC
  Filled 2020-06-17: qty 2

## 2020-06-17 MED ORDER — ROCURONIUM BROMIDE 10 MG/ML (PF) SYRINGE
PREFILLED_SYRINGE | INTRAVENOUS | Status: AC
  Filled 2020-06-17: qty 10

## 2020-06-17 MED ORDER — CEFAZOLIN SODIUM-DEXTROSE 2-4 GM/100ML-% IV SOLN
INTRAVENOUS | Status: AC
  Filled 2020-06-17: qty 100

## 2020-06-17 MED ORDER — CEFAZOLIN SODIUM-DEXTROSE 2-4 GM/100ML-% IV SOLN
2.0000 g | INTRAVENOUS | Status: AC
  Administered 2020-06-17: 2 g via INTRAVENOUS

## 2020-06-17 MED ORDER — KETOROLAC TROMETHAMINE 30 MG/ML IJ SOLN
INTRAMUSCULAR | Status: AC
  Filled 2020-06-17: qty 1

## 2020-06-17 MED ORDER — LIDOCAINE HCL (CARDIAC) PF 100 MG/5ML IV SOSY
PREFILLED_SYRINGE | INTRAVENOUS | Status: DC | PRN
  Administered 2020-06-17: 50 mg via INTRAVENOUS

## 2020-06-17 MED ORDER — SUGAMMADEX SODIUM 500 MG/5ML IV SOLN
INTRAVENOUS | Status: AC
  Filled 2020-06-17: qty 5

## 2020-06-17 MED ORDER — FENTANYL CITRATE (PF) 100 MCG/2ML IJ SOLN
100.0000 ug | Freq: Once | INTRAMUSCULAR | Status: AC
  Administered 2020-06-17: 100 ug via INTRAVENOUS

## 2020-06-17 MED ORDER — OXYCODONE HCL 5 MG/5ML PO SOLN: 5.0000 mg | Freq: Once | ORAL | Status: AC | PRN

## 2020-06-17 MED ORDER — PROPOFOL 10 MG/ML IV BOLUS
INTRAVENOUS | Status: DC | PRN
  Administered 2020-06-17: 160 mg via INTRAVENOUS
  Administered 2020-06-17: 20 mg via INTRAVENOUS

## 2020-06-17 MED ORDER — KETOROLAC TROMETHAMINE 15 MG/ML IJ SOLN
15.0000 mg | Freq: Once | INTRAMUSCULAR | Status: AC
  Administered 2020-06-17: 15 mg via INTRAVENOUS

## 2020-06-17 MED ORDER — EPHEDRINE 5 MG/ML INJ
INTRAVENOUS | Status: AC
  Filled 2020-06-17: qty 10

## 2020-06-17 MED ORDER — MIDAZOLAM HCL 2 MG/2ML IJ SOLN
2.0000 mg | Freq: Once | INTRAMUSCULAR | Status: AC
  Administered 2020-06-17: 2 mg via INTRAVENOUS

## 2020-06-17 MED ORDER — FENTANYL CITRATE (PF) 100 MCG/2ML IJ SOLN
INTRAMUSCULAR | Status: DC | PRN
Start: 2020-06-17 — End: 2020-06-17
  Administered 2020-06-17: 25 ug via INTRAVENOUS
  Administered 2020-06-17: 50 ug via INTRAVENOUS
  Administered 2020-06-17: 25 ug via INTRAVENOUS

## 2020-06-17 MED ORDER — ROCURONIUM BROMIDE 100 MG/10ML IV SOLN
INTRAVENOUS | Status: DC | PRN
  Administered 2020-06-17: 10 mg via INTRAVENOUS
  Administered 2020-06-17: 50 mg via INTRAVENOUS

## 2020-06-17 MED ORDER — LIDOCAINE 2% (20 MG/ML) 5 ML SYRINGE
INTRAMUSCULAR | Status: AC
  Filled 2020-06-17: qty 5

## 2020-06-17 MED ORDER — OXYCODONE HCL 5 MG PO TABS: 5.0000 mg | ORAL_TABLET | Freq: Three times a day (TID) | ORAL | 0 refills | Status: DC | PRN

## 2020-06-17 MED ORDER — VANCOMYCIN HCL 500 MG IV SOLR
INTRAVENOUS | Status: AC
  Filled 2020-06-17: qty 500

## 2020-06-17 MED ORDER — SODIUM CHLORIDE 0.9 % IR SOLN
Status: DC | PRN
  Administered 2020-06-17: 1000 mL

## 2020-06-17 MED ORDER — IRRISEPT - 450ML BOTTLE WITH 0.05% CHG IN STERILE WATER, USP 99.95% OPTIME
TOPICAL | Status: DC | PRN
  Administered 2020-06-17: 450 mL

## 2020-06-17 MED ORDER — DEXAMETHASONE SODIUM PHOSPHATE 10 MG/ML IJ SOLN
INTRAMUSCULAR | Status: AC
  Filled 2020-06-17: qty 1

## 2020-06-17 MED ORDER — ONDANSETRON HCL 4 MG/2ML IJ SOLN
INTRAMUSCULAR | Status: DC | PRN
  Administered 2020-06-17: 4 mg via INTRAVENOUS

## 2020-06-17 SURGICAL SUPPLY — 64 items
BENZOIN TINCTURE PRP APPL 2/3 (GAUZE/BANDAGES/DRESSINGS) IMPLANT
BIT DRILL 2 CANN GRADUATED (BIT) ×2 IMPLANT
BIT DRILL 2.5 CANN ENDOSCOPIC (BIT) ×2 IMPLANT
BIT DRILL PROFILE KREULOCK 2.7 (DRILL) ×1 IMPLANT
BLADE SURG 15 STRL LF DISP TIS (BLADE) ×2 IMPLANT
BLADE SURG 15 STRL SS (BLADE) ×2
BUTTON DIST CLAVICLE PLATE TR (Anchor) ×2 IMPLANT
COVER SURGICAL LIGHT HANDLE (MISCELLANEOUS) ×2 IMPLANT
COVER WAND RF STERILE (DRAPES) IMPLANT
DRAPE C-ARM 42X72 X-RAY (DRAPES) ×2 IMPLANT
DRAPE IMP U-DRAPE 54X76 (DRAPES) ×2 IMPLANT
DRAPE INCISE IOBAN 66X45 STRL (DRAPES) ×2 IMPLANT
DRAPE U-SHAPE 47X51 STRL (DRAPES) ×4 IMPLANT
DRAPE U-SHAPE 76X120 STRL (DRAPES) ×4 IMPLANT
DRILL PROFILE KREULOCK 2.7 (DRILL) ×2
DRSG MEPILEX BORDER 4X8 (GAUZE/BANDAGES/DRESSINGS) IMPLANT
DRSG PAD ABDOMINAL 8X10 ST (GAUZE/BANDAGES/DRESSINGS) ×2 IMPLANT
DURAPREP 26ML APPLICATOR (WOUND CARE) ×2 IMPLANT
ELECT REM PT RETURN 9FT ADLT (ELECTROSURGICAL) ×2
ELECTRODE REM PT RTRN 9FT ADLT (ELECTROSURGICAL) ×1 IMPLANT
GAUZE SPONGE 4X4 12PLY STRL (GAUZE/BANDAGES/DRESSINGS) ×2 IMPLANT
GLOVE BIOGEL PI IND STRL 7.0 (GLOVE) ×1 IMPLANT
GLOVE BIOGEL PI INDICATOR 7.0 (GLOVE) ×1
GLOVE ECLIPSE 7.0 STRL STRAW (GLOVE) ×2 IMPLANT
GLOVE SKINSENSE NS SZ7.5 (GLOVE) ×1
GLOVE SKINSENSE STRL SZ7.5 (GLOVE) ×1 IMPLANT
GLOVE SURG SYN 7.5  E (GLOVE) ×3
GLOVE SURG SYN 7.5 E (GLOVE) ×3 IMPLANT
GOWN STRL REIN XL XLG (GOWN DISPOSABLE) ×2 IMPLANT
GOWN STRL REUS W/ TWL LRG LVL3 (GOWN DISPOSABLE) ×1 IMPLANT
GOWN STRL REUS W/ TWL XL LVL3 (GOWN DISPOSABLE) ×1 IMPLANT
GOWN STRL REUS W/TWL LRG LVL3 (GOWN DISPOSABLE) ×1
GOWN STRL REUS W/TWL XL LVL3 (GOWN DISPOSABLE) ×3 IMPLANT
GUIDEWIRE 1.35MM (WIRE) ×4 IMPLANT
JET LAVAGE IRRISEPT WOUND (IRRIGATION / IRRIGATOR) ×2
LAVAGE JET IRRISEPT WOUND (IRRIGATION / IRRIGATOR) ×1 IMPLANT
MANIFOLD NEPTUNE II (INSTRUMENTS) IMPLANT
PACK ARTHROSCOPY DSU (CUSTOM PROCEDURE TRAY) ×2 IMPLANT
PACK BASIN DAY SURGERY FS (CUSTOM PROCEDURE TRAY) ×2 IMPLANT
PENCIL SMOKE EVACUATOR (MISCELLANEOUS) ×2 IMPLANT
PIN DRILL 3.7MM KNTLS DIST (PIN) ×2 IMPLANT
PLATE FRACTURE DISTAL CLAVICLE (Plate) ×2 IMPLANT
SCREW COMP KREULOCK 2.7X14 (Screw) ×4 IMPLANT
SCREW COMP KREULOCK 2.7X16 (Screw) ×4 IMPLANT
SCREW NON LOCKING 3.5X10 (Screw) ×2 IMPLANT
SCREW NON-LOCKING 3.5X12MM (Screw) ×4 IMPLANT
SHEET MEDIUM DRAPE 40X70 STRL (DRAPES) ×2 IMPLANT
SLEEVE SCD COMPRESS KNEE MED (MISCELLANEOUS) ×2 IMPLANT
SLING ARM FOAM STRAP LRG (SOFTGOODS) ×2 IMPLANT
SLING ARM FOAM STRAP MED (SOFTGOODS) ×2 IMPLANT
SPONGE LAP 18X18 RF (DISPOSABLE) ×2 IMPLANT
STRIP CLOSURE SKIN 1/2X4 (GAUZE/BANDAGES/DRESSINGS) IMPLANT
SUCTION FRAZIER HANDLE 10FR (MISCELLANEOUS) ×1
SUCTION TUBE FRAZIER 10FR DISP (MISCELLANEOUS) ×1 IMPLANT
SUT FIBERWIRE #2 38 T-5 BLUE (SUTURE)
SUT MNCRL AB 4-0 PS2 18 (SUTURE) ×2 IMPLANT
SUT VIC AB 0 CT1 27 (SUTURE) ×1
SUT VIC AB 0 CT1 27XBRD ANBCTR (SUTURE) ×1 IMPLANT
SUT VIC AB 2-0 CT1 27 (SUTURE) ×1
SUT VIC AB 2-0 CT1 TAPERPNT 27 (SUTURE) ×1 IMPLANT
SUTURE FIBERWR #2 38 T-5 BLUE (SUTURE) IMPLANT
SYR BULB EAR ULCER 3OZ GRN STR (SYRINGE) IMPLANT
TOWEL GREEN STERILE FF (TOWEL DISPOSABLE) ×2 IMPLANT
YANKAUER SUCT BULB TIP NO VENT (SUCTIONS) ×2 IMPLANT

## 2020-06-17 NOTE — Progress Notes (Signed)
Assisted Dr. Witman with right, ultrasound guided, interscalene  block. Side rails up, monitors on throughout procedure. See vital signs in flow sheet. Tolerated Procedure well. 

## 2020-06-17 NOTE — Discharge Instructions (Signed)
Postoperative instructions:  Weightbearing instructions: non weight bearing, remain in sling at all times  Keep your dressing and/or splint clean and dry at all times.  You can remove your dressing on post-operative day #3 and change with a dry/sterile dressing or Band-Aids as needed thereafter.    Incision instructions:  Do not soak your incision for 3 weeks after surgery.  If the incision gets wet, pat dry and do not scrub the incision.  Pain control:  You have been given a prescription to be taken as directed for post-operative pain control.  In addition, elevate the operative extremity above the heart at all times to prevent swelling and throbbing pain.  Take over-the-counter Colace, 100mg  by mouth twice a day while taking narcotic pain medications to help prevent constipation.  Follow up appointments: 1) 14 days for suture removal and wound check. 2) Dr. as scheduled.   -------------------------------------------------------------------------------------------------------------  After Surgery Pain Control:  After your surgery, post-surgical discomfort or pain is likely. This discomfort can last several days to a few weeks. At certain times of the day your discomfort may be more intense.  Did you receive a nerve block?  A nerve block can provide pain relief for one hour to two days after your surgery. As long as the nerve block is working, you will experience little or no sensation in the area the surgeon operated on.  As the nerve block wears off, you will begin to experience pain or discomfort. It is very important that you begin taking your prescribed pain medication before the nerve block fully wears off. Treating your pain at the first sign of the block wearing off will ensure your pain is better controlled and more tolerable when full-sensation returns. Do not wait until the pain is intolerable, as the medicine will be less effective. It is better to treat pain in advance than  to try and catch up.  General Anesthesia:  If you did not receive a nerve block during your surgery, you will need to start taking your pain medication shortly after your surgery and should continue to do so as prescribed by your surgeon.  Pain Medication:  Most commonly we prescribe Vicodin and Percocet for post-operative pain. Both of these medications contain a combination of acetaminophen (Tylenol) and a narcotic to help control pain.   It takes between 30 and 45 minutes before pain medication starts to work. It is important to take your medication before your pain level gets too intense.   Nausea is a common side effect of many pain medications. You will want to eat something before taking your pain medicine to help prevent nausea.   If you are taking a prescription pain medication that contains acetaminophen, we recommend that you do not take additional over the counter acetaminophen (Tylenol).  Other pain relieving options:   Using a cold pack to ice the affected area a few times a day (15 to 20 minutes at a time) can help to relieve pain, reduce swelling and bruising.   Elevation of the affected area can also help to reduce pain and swelling.    Post Anesthesia Home Care Instructions  Activity: Get plenty of rest for the remainder of the day. A responsible individual must stay with you for 24 hours following the procedure.  For the next 24 hours, DO NOT: -Drive a car -Roda Shutters -Drink alcoholic beverages -Take any medication unless instructed by your physician -Make any legal decisions or sign important papers.  Meals: Start  with liquid foods such as gelatin or soup. Progress to regular foods as tolerated. Avoid greasy, spicy, heavy foods. If nausea and/or vomiting occur, drink only clear liquids until the nausea and/or vomiting subsides. Call your physician if vomiting continues.  Special Instructions/Symptoms: Your throat may feel dry or sore from the anesthesia  or the breathing tube placed in your throat during surgery. If this causes discomfort, gargle with warm salt water. The discomfort should disappear within 24 hours.  If you had a scopolamine patch placed behind your ear for the management of post- operative nausea and/or vomiting:  1. The medication in the patch is effective for 72 hours, after which it should be removed.  Wrap patch in a tissue and discard in the trash. Wash hands thoroughly with soap and water. 2. You may remove the patch earlier than 72 hours if you experience unpleasant side effects which may include dry mouth, dizziness or visual disturbances. 3. Avoid touching the patch. Wash your hands with soap and water after contact with the patch.    Regional Anesthesia Blocks  1. Numbness or the inability to move the "blocked" extremity may last from 3-48 hours after placement. The length of time depends on the medication injected and your individual response to the medication. If the numbness is not going away after 48 hours, call your surgeon.  2. The extremity that is blocked will need to be protected until the numbness is gone and the  Strength has returned. Because you cannot feel it, you will need to take extra care to avoid injury. Because it may be weak, you may have difficulty moving it or using it. You may not know what position it is in without looking at it while the block is in effect.  3. For blocks in the legs and feet, returning to weight bearing and walking needs to be done carefully. You will need to wait until the numbness is entirely gone and the strength has returned. You should be able to move your leg and foot normally before you try and bear weight or walk. You will need someone to be with you when you first try to ensure you do not fall and possibly risk injury.  4. Bruising and tenderness at the needle site are common side effects and will resolve in a few days.  5. Persistent numbness or new problems with  movement should be communicated to the surgeon or the [facility] [phone] [facility] [phone]).

## 2020-06-17 NOTE — Anesthesia Procedure Notes (Signed)
Procedure Name: Intubation Date/Time: 06/17/2020 2:37 PM Performed by: Thornell Mule, CRNA Pre-anesthesia Checklist: Patient identified, Emergency Drugs available, Suction available and Patient being monitored Patient Re-evaluated:Patient Re-evaluated prior to induction Oxygen Delivery Method: Circle system utilized Preoxygenation: Pre-oxygenation with 100% oxygen Induction Type: IV induction Ventilation: Mask ventilation without difficulty Laryngoscope Size: Miller and 3 Grade View: Grade I Tube type: Oral Tube size: 7.0 mm Number of attempts: 1 Airway Equipment and Method: Stylet and Oral airway Placement Confirmation: ETT inserted through vocal cords under direct vision,  positive ETCO2 and breath sounds checked- equal and bilateral Secured at: 21 cm Tube secured with: Tape Dental Injury: Teeth and Oropharynx as per pre-operative assessment

## 2020-06-17 NOTE — Anesthesia Postprocedure Evaluation (Signed)
Anesthesia Post Note  Patient: Kelly Walton  Procedure(s) Performed: OPEN REDUCTION INTERNAL FIXATION (ORIF) RIGHT CLAVICLE FRACTURE, ACROMIOCLAVICULAR SEPARATION (Right Shoulder)     Patient location during evaluation: PACU Anesthesia Type: General Level of consciousness: sedated Pain management: pain level controlled Vital Signs Assessment: post-procedure vital signs reviewed and stable Respiratory status: spontaneous breathing and respiratory function stable Cardiovascular status: stable Postop Assessment: no apparent nausea or vomiting Anesthetic complications: no   No complications documented.  Last Vitals:  Vitals:   06/17/20 1715 06/17/20 1730  BP: (!) 144/94 (!) 147/92  Pulse: (!) 110 (!) 108  Resp: (!) 24 17  Temp:  36.6 C  SpO2: 94% 96%    Last Pain:  Vitals:   06/17/20 1730  TempSrc:   PainSc: 7                  Irini Leet DANIEL

## 2020-06-17 NOTE — Op Note (Signed)
° °  Date of Surgery: 06/17/2020  INDICATIONS: Ms. Henshaw is a 49 y.o.-year-old female with a right distal clavicle fracture with rupture of the coracoclavicular ligaments.  The patient did consent to the procedure after discussion of the risks and benefits.  PREOPERATIVE DIAGNOSIS:  1.  Displaced right distal clavicle fracture 2.  Complete disruption of right coracoclavicular ligaments  POSTOPERATIVE DIAGNOSIS: Same.  PROCEDURE:  1.  Open reduction internal fixation of right clavicle fracture 2.  Reconstruction of right coracoclavicular ligaments with tight rope device  SURGEON: N. Glee Arvin, M.D.  ASSIST: Starlyn Skeans [place], [place]; necessary for the timely completion of procedure and due to complexity of procedure.  ANESTHESIA:  general, regional  IV FLUIDS AND URINE: See anesthesia.  ESTIMATED BLOOD LOSS: 100 mL.  IMPLANTS: Arthrex distal clavicle plate, Arthrex CC ligament tight rope  DRAINS: None  COMPLICATIONS: see description of procedure.  DESCRIPTION OF PROCEDURE: The patient was brought to the operating room.  The patient had been signed prior to the procedure and this was documented. The patient had the anesthesia placed by the anesthesiologist.  A time-out was performed to confirm that this was the correct patient, site, side and location. The patient did receive antibiotics prior to the incision and was re-dosed during the procedure as needed at indicated intervals.  The patient had the operative extremity prepped and draped in the standard surgical fashion.    A horizontal incision was made over the anterior aspect of the lateral half of the clavicle.  Dissection was carried down through the subcutaneous tissue.  Full-thickness flaps were raised.  The shaft of the clavicle and the [facility] joint was exposed as well as the distal clavicle fracture.  Dissection was carried down through the muscle to expose the coracoid.  The remaining distal clavicle fragment was quite small.   I found that her CC ligaments have been ruptured during the dissection.  Fracture reduction was first obtained and I placed a superior distal clavicle plate at the appropriate position using fluoroscopic guidance.  I placed 3 nonlocking bicortical screws in the shaft of the clavicle with each with excellent purchase.  I then reduced the coracoclavicular distance back to the anatomic position and I placed a tight rope through the superior portion of the plate down through the base of the coracoid.  The button was then flipped inferiorly and checked under fluoroscopy and the tight rope was tightened down to the appropriate tension which also brought the distal clavicle fracture into reduction.  I then attempted to place screws into the distal fragment but I was only able to get really 1 good screw into the distal fragment with good purchase.  The other 2 screws had tenuous fixation.  Final x-rays were taken.  The wound was then thoroughly irrigated with normal saline.  Vancomycin powder was placed in the surgical wound.  Layered closure was performed.  Sterile dressings were applied.  Shoulder sling placed.  Patient tolerated procedure well had no immediate complications.  POSTOPERATIVE PLAN: Nonweightbearing to the right upper extremity.  Remain in shoulder sling at all times.  No range of motion of the shoulder until follow-up.  [name], MD 4:37 PM

## 2020-06-17 NOTE — Transfer of Care (Signed)
Immediate Anesthesia Transfer of Care Note  Patient: Kelly Walton  Procedure(s) Performed: OPEN REDUCTION INTERNAL FIXATION (ORIF) RIGHT CLAVICLE FRACTURE, ACROMIOCLAVICULAR SEPARATION (Right Shoulder)  Patient Location: PACU  Anesthesia Type:General and Regional  Level of Consciousness: drowsy, patient cooperative and responds to stimulation  Airway & Oxygen Therapy: Patient Spontanous Breathing and Patient connected to face mask oxygen  Post-op Assessment: Report given to RN and Post -op Vital signs reviewed and stable  Post vital signs: Reviewed and stable  Last Vitals:  Vitals Value Taken Time  BP    Temp 37.2 C 06/17/20 1650  Pulse 111 06/17/20 1650  Resp 15 06/17/20 1650  SpO2 99 % 06/17/20 1650    Last Pain:  Vitals:   06/17/20 1239  TempSrc: Oral  PainSc: 10-Worst pain ever      Patients Stated Pain Goal: 5 (06/17/20 1239)  Complications: No complications documented.

## 2020-06-17 NOTE — Telephone Encounter (Signed)
I'm sorry that I am just now seeing this

## 2020-06-17 NOTE — Anesthesia Procedure Notes (Signed)
Anesthesia Regional Block: Interscalene brachial plexus block   Pre-Anesthetic Checklist: ,, timeout performed, Correct Patient, Correct Site, Correct Laterality, Correct Procedure, Correct Position, site marked, Risks and benefits discussed,  Surgical consent,  Pre-op evaluation,  At surgeon's request and post-op pain management  Laterality: Right  Prep: chloraprep       Needles:  Injection technique: Single-shot  Needle Type: Echogenic Stimulator Needle     Needle Length: 10cm  Needle Gauge: 20     Additional Needles:   Procedures:,,,, ultrasound used (permanent image in chart),,,,  Narrative:  Start time: 06/17/2020 1:07 PM End time: 06/17/2020 1:12 PM Injection made incrementally with aspirations every 5 mL.  Performed by: Personally  Anesthesiologist: Lucretia Kern, MD  Additional Notes: Standard monitors applied. Skin prepped. Good needle visualization with ultrasound. Injection made in 5cc increments with no resistance to injection. Patient tolerated the procedure well.

## 2020-06-17 NOTE — Anesthesia Preprocedure Evaluation (Addendum)
Anesthesia Evaluation  Patient identified by MRN, date of birth, ID band Patient awake    Reviewed: Allergy & Precautions, NPO status , Patient's Chart, lab work & pertinent test results  History of Anesthesia Complications Negative for: history of anesthetic complications  Airway Mallampati: II  TM Distance: >3 FB Neck ROM: Full    Dental  (+) Teeth Intact   Pulmonary asthma ,    Pulmonary exam normal        Cardiovascular hypertension, Normal cardiovascular exam     Neuro/Psych Anxiety negative neurological ROS     GI/Hepatic negative GI ROS, (+)     substance abuse  alcohol use,   Endo/Other  negative endocrine ROS  Renal/GU negative Renal ROS  negative genitourinary   Musculoskeletal negative musculoskeletal ROS (+)   Abdominal   Peds  Hematology negative hematology ROS (+)   Anesthesia Other Findings  Normal echo 2020  Reproductive/Obstetrics                            Anesthesia Physical Anesthesia Plan  ASA: II  Anesthesia Plan: General   Post-op Pain Management: GA combined w/ Regional for post-op pain   Induction: Intravenous  PONV Risk Score and Plan: 3 and Ondansetron, Dexamethasone, Treatment may vary due to age or medical condition and Midazolam  Airway Management Planned: Oral ETT  Additional Equipment: None  Intra-op Plan:   Post-operative Plan: Extubation in OR  Informed Consent: I have reviewed the patients History and Physical, chart, labs and discussed the procedure including the risks, benefits and alternatives for the proposed anesthesia with the patient or authorized representative who has indicated his/her understanding and acceptance.     Dental advisory given  Plan Discussed with:   Anesthesia Plan Comments:        Anesthesia Quick Evaluation

## 2020-06-17 NOTE — H&P (Signed)
PREOPERATIVE H&P  Chief Complaint: right distal clavicle fracture, acromioclavicular separation  HPI: Kelly Walton is a 49 y.o. female who presents for surgical treatment of right distal clavicle fracture, acromioclavicular separation.  She denies any changes in medical history.  Past Medical History:  Diagnosis Date  . Acute right lower quadrant pain 06/10/2018  . Alcohol abuse   . Asthma   . Hypertension   . Insomnia   . Pain of upper abdomen 06/21/2017  . Palpitations    a. 05/2019 Echo: EF 60-65%, diast dysfxn; b. 05/2019 Zio: Sinus rhythm-sinus tach. Avg HR 105. 6 SVT episodes - longest 7 beats. Rare PVCs-->Diltiazem added.  Marland Kitchen Spongiotic dermatitis    Past Surgical History:  Procedure Laterality Date  . CESAREAN SECTION    . FRACTURE SURGERY    . ROBOTIC ASSISTED TOTAL HYSTERECTOMY N/A 03/05/2014   Procedure: ROBOTIC ASSISTED TOTAL HYSTERECTOMY/BILATERAL SALPINGECTOMY;  Surgeon: Serita Kyle, MD;  Location: [facility];  Service: Gynecology;  Laterality: N/A;  . TUBAL LIGATION     Social History   Socioeconomic History  . Marital status: Single    Spouse name: Not on file  . Number of children: Not on file  . Years of education: Not on file  . Highest education level: Not on file  Occupational History  . Not on file  Tobacco Use  . Smoking status: Never Smoker  . Smokeless tobacco: Never Used  Vaping Use  . Vaping Use: Never used  Substance and Sexual Activity  . Alcohol use: Yes    Comment: occasion  . Drug use: No  . Sexual activity: Not on file  Other Topics Concern  . Not on file  Social History Narrative   Single.   2 children, 1 step-son.   Manager at [employer].   Enjoys going to [employer], spending time with family.    Social Determinants of Health   Financial Resource Strain:   . Difficulty of Paying Living Expenses: Not on file  Food Insecurity:   . Worried About Programme researcher, broadcasting/film/video in the Last Year: Not on file  . Ran Out of Food in the Last  Year: Not on file  Transportation Needs:   . Lack of Transportation (Medical): Not on file  . Lack of Transportation (Non-Medical): Not on file  Physical Activity:   . Days of Exercise per Week: Not on file  . Minutes of Exercise per Session: Not on file  Stress:   . Feeling of Stress : Not on file  Social Connections:   . Frequency of Communication with Friends and Family: Not on file  . Frequency of Social Gatherings with Friends and Family: Not on file  . Attends Religious Services: Not on file  . Active Member of Clubs or Organizations: Not on file  . Attends Banker Meetings: Not on file  . Marital Status: Not on file   Family History  Problem Relation Age of Onset  . Arthritis Mother   . Arthritis Father   . Lung cancer Father   . Heart attack Father   . Hyperlipidemia Maternal Grandmother   . Stroke Maternal Grandmother   . Hypertension Maternal Grandmother   . Heart attack Maternal Grandmother   . Alcohol abuse Paternal Grandmother   . Arthritis Paternal Grandmother   . Mental illness Paternal Grandmother   . Breast cancer Maternal Aunt   . Epilepsy Brother    Allergies  Allergen Reactions  . Promethazine Hcl Hives and Rash  .  Sulfa Antibiotics Hives and Rash    Pt states sent her to the hospital.  . Trazodone And Nefazodone     Hallucinations    Prior to Admission medications   Medication Sig Start Date End Date Taking? Authorizing Provider  albuterol (VENTOLIN HFA) 108 (90 Base) MCG/ACT inhaler Inhale 2 puffs into the lungs every 6 (six) hours as needed for wheezing or shortness of breath. 04/09/20  Yes Doreene Nest, NP  calcium-vitamin D (OSCAL WITH D) 500-200 MG-UNIT tablet Take 1 tablet by mouth.   Yes [provider]  diclofenac Sodium (VOLTAREN) 1 % GEL Apply 4 g topically 4 (four) times daily as needed. 04/10/20  Yes Hilts, Casimiro Needle, MD  diltiazem (CARDIZEM CD) 180 MG 24 hr capsule Take 1 capsule (180 mg total) by mouth daily.  04/04/20 07/03/20 Yes Visser, Jacquelyn D, PA-C  HYDROcodone-acetaminophen (NORCO/VICODIN) 5-325 MG tablet Take 1 tablet by mouth every 6 (six) hours as needed for moderate pain. 06/11/20  Yes Baity, Salvadore Oxford, NP  Menaquinone-7 (VITAMIN K2 PO) Take by mouth.   Yes [provider]  montelukast (SINGULAIR) 10 MG tablet TAKE 1 TABLET (10 MG TOTAL) BY MOUTH AT BEDTIME. FOR ALLERGIES/ASTHMA. 08/25/19  Yes Doreene Nest, NP  sertraline (ZOLOFT) 50 MG tablet TAKE 1 TABLET (50 MG TOTAL) BY MOUTH DAILY. FOR ANXIETY. 04/23/20  Yes Doreene Nest, NP  tiZANidine (ZANAFLEX) 2 MG tablet TAKE 1-2 TABLETS (2-4 MG TOTAL) BY MOUTH EVERY 6 (SIX) HOURS AS NEEDED FOR MUSCLE SPASMS. 05/02/20   Hilts, Casimiro Needle, MD  tiZANidine (ZANAFLEX) 2 MG tablet Take 1-2 tablets (2-4 mg total) by mouth every 6 (six) hours as needed for muscle spasms. 12/06/19   Hilts, Casimiro Needle, MD  tiZANidine (ZANAFLEX) 2 MG tablet TAKE 1-2 TABLETS (2-4 MG TOTAL) BY MOUTH EVERY 6 (SIX) HOURS AS NEEDED FOR MUSCLE SPASMS. 01/18/20   Hilts, Casimiro Needle, MD     Positive ROS: All other systems have been reviewed and were otherwise negative with the exception of those mentioned in the HPI and as above.  Physical Exam: General: Alert, no acute distress Cardiovascular: No pedal edema Respiratory: No cyanosis, no use of accessory musculature GI: abdomen soft Skin: No lesions in the area of chief complaint Neurologic: Sensation intact distally Psychiatric: Patient is competent for consent with normal mood and affect Lymphatic: no lymphedema  MUSCULOSKELETAL: exam stable  Assessment: right distal clavicle fracture, acromioclavicular separation  Plan: Plan for Procedure(s): OPEN REDUCTION INTERNAL FIXATION (ORIF) RIGHT CLAVICLE FRACTURE, ACROMIOCLAVICULAR SEPARATION  The risks benefits and alternatives were discussed with the patient including but not limited to the risks of nonoperative treatment, versus surgical intervention including  infection, bleeding, nerve injury,  blood clots, cardiopulmonary complications, morbidity, mortality, among others, and they were willing to proceed.   Preoperative templating of the joint replacement has been completed, documented, and submitted to the Operating Room personnel in order to optimize intra-operative equipment management.   Glee Arvin, MD 06/17/2020 7:08 AM

## 2020-06-18 ENCOUNTER — Other Ambulatory Visit: Payer: Self-pay | Admitting: Physician Assistant

## 2020-06-18 ENCOUNTER — Telehealth: Payer: Self-pay | Admitting: Orthopaedic Surgery

## 2020-06-18 ENCOUNTER — Telehealth: Payer: Self-pay

## 2020-06-18 ENCOUNTER — Encounter: Payer: Self-pay | Admitting: Orthopaedic Surgery

## 2020-06-18 MED ORDER — HYDROCODONE-ACETAMINOPHEN 7.5-325 MG PO TABS: 1.0000 | ORAL_TABLET | Freq: Three times a day (TID) | ORAL | 0 refills | Status: DC | PRN

## 2020-06-18 MED ORDER — KETOROLAC TROMETHAMINE 10 MG PO TABS: 10.0000 mg | ORAL_TABLET | Freq: Two times a day (BID) | ORAL | 0 refills | Status: DC | PRN

## 2020-06-18 NOTE — Telephone Encounter (Signed)
See other messages

## 2020-06-18 NOTE — Telephone Encounter (Signed)
Patient called in saying she is advised that she needs pre auth for medication , wants to know have we received pre auth . Says she is in a lot of pain doesn't have any pains at all

## 2020-06-18 NOTE — Telephone Encounter (Signed)
See other message

## 2020-06-18 NOTE — Telephone Encounter (Signed)
Patient called. Says she is in a lot of pain. She would like some pain medication called in for her. Her call back number is [phone]

## 2020-06-18 NOTE — Telephone Encounter (Signed)
Patient called advised the Oxycodone need prior auth. Before the Rx can be filled. Patient said she do not have any pain medicine to take and she is in a lot of pain.  Patient said Tylenol is not working.   The number to contact patient is [phone]

## 2020-06-18 NOTE — Telephone Encounter (Signed)
Pending approval.  Patient aware. Not  sure if this will get approved. Mardella Layman has called in 2 other Rx just in case Oxycodone not approved. If ins does not cover then she will have to pay out of pocket.   Patient was upset. States she sent multiple messages. I did advised her Roda Shutters had Clinic all day and we try to do our messages in between. Usually take 24-48 hours.

## 2020-06-18 NOTE — Telephone Encounter (Signed)
Neilah Barsky (Key: [id number])  Your information has been submitted to [employer]. To check for an updated outcome later, reopen this PA request from your dashboard.  If [employer] has not responded to your request within 24 hours, contact [employer] at [phone]. If you think there may be a problem with your PA request, use our live chat feature at the bottom right.

## 2020-06-18 NOTE — Telephone Encounter (Signed)
Betzayda Dehaan KeyAngie Fava - PA Case ID: [id number] - Rx #: [id number]

## 2020-06-19 ENCOUNTER — Encounter (HOSPITAL_BASED_OUTPATIENT_CLINIC_OR_DEPARTMENT_OTHER): Payer: Self-pay | Admitting: Orthopaedic Surgery

## 2020-06-19 ENCOUNTER — Telehealth: Payer: Self-pay

## 2020-06-19 NOTE — Telephone Encounter (Signed)
Ok, thanks.

## 2020-06-19 NOTE — Telephone Encounter (Signed)
Called patient to see if she was able to get her other Medications. States she did pick up hydrocodone but did NOT pick up Toradol. She said pharmacist told her Rx had Sulfur and is allergic to it.   FYI

## 2020-06-20 ENCOUNTER — Telehealth: Payer: Self-pay | Admitting: Primary Care

## 2020-06-20 ENCOUNTER — Encounter: Payer: Self-pay | Admitting: Orthopaedic Surgery

## 2020-06-20 ENCOUNTER — Other Ambulatory Visit: Payer: Self-pay | Admitting: Orthopaedic Surgery

## 2020-06-20 MED ORDER — HYDROCODONE-ACETAMINOPHEN 7.5-325 MG PO TABS: 1.0000 | ORAL_TABLET | Freq: Two times a day (BID) | ORAL | 0 refills | Status: DC | PRN

## 2020-06-20 NOTE — Telephone Encounter (Signed)
Please notify patient that I received papers for short-term disability. We will need to meet in order for me to complete this paperwork. Okay to do virtual visit.

## 2020-06-21 NOTE — Telephone Encounter (Signed)
Mail box full not able to leave voice mail.  

## 2020-06-24 ENCOUNTER — Telehealth: Payer: Self-pay | Admitting: Orthopaedic Surgery

## 2020-06-24 ENCOUNTER — Other Ambulatory Visit: Payer: Self-pay | Admitting: Orthopaedic Surgery

## 2020-06-24 MED ORDER — HYDROCODONE-ACETAMINOPHEN 7.5-325 MG PO TABS: 1.0000 | ORAL_TABLET | Freq: Two times a day (BID) | ORAL | 0 refills | Status: DC | PRN

## 2020-06-24 NOTE — Telephone Encounter (Signed)
I do not. My question is, would not her surgeon complete this paperwork?  She underwent surgery for a clavicle fracture.  I do not have any clue as to how long he needs to her to be out.

## 2020-06-24 NOTE — Telephone Encounter (Signed)
[employer] forms received. Sent to [employer] 

## 2020-06-24 NOTE — Telephone Encounter (Signed)
Called patient virtual appointment made to have PPW filled out. Do you have copy of this?

## 2020-06-25 NOTE — Telephone Encounter (Signed)
Pt called office will have surgeon fill our forms.

## 2020-06-25 NOTE — Telephone Encounter (Signed)
Pt is going to call surgeon to have them fill out paperwork

## 2020-06-25 NOTE — Telephone Encounter (Signed)
Left message to return call to our office.  

## 2020-06-28 ENCOUNTER — Telehealth: Payer: No Typology Code available for payment source | Admitting: Primary Care

## 2020-07-02 ENCOUNTER — Ambulatory Visit (INDEPENDENT_AMBULATORY_CARE_PROVIDER_SITE_OTHER): Payer: No Typology Code available for payment source | Admitting: Orthopaedic Surgery

## 2020-07-02 ENCOUNTER — Other Ambulatory Visit: Payer: Self-pay

## 2020-07-02 ENCOUNTER — Other Ambulatory Visit (HOSPITAL_COMMUNITY)
Admission: RE | Admit: 2020-07-02 | Discharge: 2020-07-02 | Disposition: A | Discharge status: Home / Self Care | Payer: No Typology Code available for payment source | Source: Ambulatory Visit | Attending: Orthopaedic Surgery | Admitting: Orthopaedic Surgery

## 2020-07-02 ENCOUNTER — Encounter: Payer: Self-pay | Admitting: Orthopaedic Surgery

## 2020-07-02 ENCOUNTER — Ambulatory Visit (INDEPENDENT_AMBULATORY_CARE_PROVIDER_SITE_OTHER): Payer: No Typology Code available for payment source

## 2020-07-02 DIAGNOSIS — M25562 Pain in left knee: Secondary | ICD-10-CM | POA: Diagnosis not present

## 2020-07-02 DIAGNOSIS — Z20822 Contact with and (suspected) exposure to covid-19: Secondary | ICD-10-CM | POA: Insufficient documentation

## 2020-07-02 DIAGNOSIS — S42031A Displaced fracture of lateral end of right clavicle, initial encounter for closed fracture: Secondary | ICD-10-CM

## 2020-07-02 DIAGNOSIS — Z01812 Encounter for preprocedural laboratory examination: Secondary | ICD-10-CM | POA: Diagnosis not present

## 2020-07-02 LAB — SARS CORONAVIRUS 2 (TAT 6-24 HRS): SARS Coronavirus 2: NEGATIVE

## 2020-07-02 MED ORDER — HYDROCODONE-ACETAMINOPHEN 7.5-325 MG PO TABS: 1.0000 | ORAL_TABLET | Freq: Two times a day (BID) | ORAL | 0 refills | Status: DC | PRN

## 2020-07-02 NOTE — Progress Notes (Signed)
Office Visit Note   Patient: Kelly Walton           Date of Birth: 02-Jul-1971           MRN: [id number] Visit Date: 07/02/2020              Requested by: Doreene Nest, NP [address] [place],  [place] [address] PCP: Doreene Nest, NP   Assessment & Plan: Visit Diagnoses:  1. Traumatic closed fracture of distal clavicle with minimal displacement, right, initial encounter   2. Acute pain of left knee     Plan: In terms of the left knee I think this is likely contusion and bone bruise recommend symptomatic treatment with ice and activity modifications and over-the-counter medications.  For the right shoulder fall may have caused a disruption of the tight rope device.  Her bone quality was poor.  Given how superiorly displaced the distal clavicle is and how this is likely going to cause problems with hardware irritation and possibly even skin compromise I have recommended revision surgery in terms of evaluating the tight rope device as well as using allograft to augment the CC ligament reconstruction.  Risk benefits alternatives discussed.  Plan for surgery this Friday.  Questions encouraged and answered.  Follow-Up Instructions: Return if symptoms worsen or fail to improve.   Orders:  Orders Placed This Encounter  Procedures  . XR Clavicle Right  . XR KNEE 3 VIEW LEFT   No orders of the defined types were placed in this encounter.     Procedures: No procedures performed   Clinical Data: No additional findings.   Subjective: Chief Complaint  Patient presents with  . Right Shoulder - Follow-up  . Left Knee - Pain    Kelly Walton is 2 weeks status post ORIF right distal clavicle fracture and tight rope fixation CC ligament disruption.  She had a fall recently onto her left knee which she is also asking to be evaluated.  She did not strike the shoulder directly.  Overall the shoulder is feeling better.  Left knee is bruised and slightly swollen and moderately painful  with walking and weightbearing.  Slight limp.   Review of Systems  Constitutional: Negative.   HENT: Negative.   Eyes: Negative.   Respiratory: Negative.   Cardiovascular: Negative.   Endocrine: Negative.   Musculoskeletal: Negative.   Neurological: Negative.   Hematological: Negative.   Psychiatric/Behavioral: Negative.   All other systems reviewed and are negative.    Objective: Vital Signs: LMP 03/05/2014 Comment: LMP x 15yrs ago   Physical Exam Vitals and nursing note reviewed.  Constitutional:      Appearance: She is well-developed.  Pulmonary:     Effort: Pulmonary effort is normal.  Skin:    General: Skin is warm.     Capillary Refill: Capillary refill takes less than 2 seconds.  Neurological:     Mental Status: She is alert and oriented to person, place, and time.  Psychiatric:        Behavior: Behavior normal.        Thought Content: Thought content normal.        Judgment: Judgment normal.     Ortho Exam Right shoulder shows a healed surgical incision.  She is very tender over the distal clavicle portion of the leg is.  There is no skin compromise or tenting.  Neuro vas intact distally.  Left knee shows mild bruising and swelling.  No joint effusion.  Collaterals  and cruciates are stable.  Able to weight-bear.  Normal range of motion. Specialty Comments:  No specialty comments available.  Imaging: XR Clavicle Right  Result Date: 07/02/2020 Intact fixation of distal clavicle fracture.  Type III AC separation  XR KNEE 3 VIEW LEFT  Result Date: 07/02/2020 No acute or structural abnormalities    PMFS History: Patient Active Problem List   Diagnosis Date Noted  . Traumatic closed fracture of distal clavicle with minimal displacement, right, initial encounter 06/11/2020  . Dyspareunia, female 04/24/2020  . Periumbilical abdominal pain 03/12/2020  . Diarrhea 03/12/2020  . Toe pain, chronic, left 02/28/2020  . GAD (generalized anxiety disorder)  01/02/2020  . Alcohol abuse 01/02/2020  . Hand injury, right, initial encounter 09/26/2019  . Palpitations 04/03/2019  . Seasonal allergies 01/16/2019  . Elevated LFTs 11/15/2018  . 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 Hyperlipidemia 07/08/2018  . Asthma 08/20/2017  . Preventative health care 06/21/2017  . Insomnia 04/16/2017  . Essential hypertension 04/16/2017   Past Medical History:  Diagnosis Date  . Acute right lower quadrant pain 06/10/2018  . Alcohol abuse   . Asthma   . Hypertension   . Insomnia   . Pain of upper abdomen 06/21/2017  . Palpitations    a. 05/2019 Echo: EF 60-65%, diast dysfxn; b. 05/2019 Zio: Sinus rhythm-sinus tach. Avg HR 105. 6 SVT episodes - longest 7 beats. Rare PVCs-->Diltiazem added.  Marland Kitchen Spongiotic dermatitis     Family History  Problem Relation Age of Onset  . Arthritis Mother   . Arthritis Father   . Lung cancer Father   . Heart attack Father   . Hyperlipidemia Maternal Grandmother   . Stroke Maternal Grandmother   . Hypertension Maternal Grandmother   . Heart attack Maternal Grandmother   . Alcohol abuse Paternal Grandmother   . Arthritis Paternal Grandmother   . Mental illness Paternal Grandmother   . Breast cancer Maternal Aunt   . Epilepsy Brother     Past Surgical History:  Procedure Laterality Date  . CESAREAN SECTION    . FRACTURE SURGERY    . ORIF CLAVICULAR FRACTURE Right 06/17/2020   Procedure: OPEN REDUCTION INTERNAL FIXATION (ORIF) RIGHT CLAVICLE FRACTURE, ACROMIOCLAVICULAR SEPARATION;  Surgeon: Tarry Kos, MD;  Location: [facility];  Service: Orthopedics;  Laterality: Right;  . ROBOTIC ASSISTED TOTAL HYSTERECTOMY N/A 03/05/2014   Procedure: ROBOTIC ASSISTED TOTAL HYSTERECTOMY/BILATERAL SALPINGECTOMY;  Surgeon: Serita Kyle, MD;  Location: [facility];  Service: Gynecology;  Laterality: N/A;  . TUBAL LIGATION     Social History   Occupational History  . Not on file  Tobacco Use  . Smoking status: Never  Smoker  . Smokeless tobacco: Never Used  Vaping Use  . Vaping Use: Never used  Substance and Sexual Activity  . Alcohol use: Yes    Comment: occasion  . Drug use: No  . Sexual activity: Not on file

## 2020-07-03 NOTE — Progress Notes (Signed)
PCP - Vernona Rieger, Md Cardiologist - Dr. Kirke Corin  Chest x-ray - n/a EKG - 04/04/20 Stress Test - pt denies ECHO - 06/01/19 Cardiac Cath - pt denies   Blood Thinner Instructions: n/a Aspirin Instructions: n/a  ERAS Protcol - yes clears until 1030   COVID TEST- 07/02/20 negative   Anesthesia review: n/a   -------------  SDW INSTRUCTIONS:  Your procedure is scheduled on 07-05-20 Friday.  Report to [facility] Main Entrance "A" at 1100 A.M., and check in at the Admitting office.  Call this number if you have problems the morning of surgery: [phone]   Remember: Do not eat after midnight the night before your surgery  You may drink clear liquids until 1030 the morning of your surgery.   Clear liquids allowed are: Water, Non-Citrus Juices (without pulp), Carbonated Beverages, Clear Tea, Black Coffee Only, and Gatorade    Take these medicines the morning of surgery with A SIP OF WATER:  albuterol (VENTOLIN HFA) ---- Please bring all inhalers with you the day of surgery.   diltiazem (CARDIZEM CD)  HYDROcodone-acetaminophen (NORCO) if needed montelukast (SINGULAIR)  oxyCODONE (OXY IR/ROXICODONE) if needed  tiZANidine (ZANAFLEX) if needed  As of today, STOP taking any diclofenac Sodium (VOLTAREN) gel, ketorolac (TORADOL), Aspirin (unless otherwise instructed by your surgeon), Aleve, Naproxen, Ibuprofen, Motrin, Advil, Goody's, BC's, all herbal medications, fish oil, and all vitamins.    The Morning of Surgery  Do not wear jewelry, make-up or nail polish.  Do not wear lotions, powders, or perfumes, or deodorant  Do not shave 48 hours prior to surgery.    Do not bring valuables to the hospital.  [facility] is not responsible for any belongings or valuables.  If you are a smoker, DO NOT Smoke 24 hours prior to surgery  If you wear a CPAP at night please bring your mask the morning of surgery   Remember that you must have someone to transport you home after your  surgery, and remain with you for 24 hours if you are discharged the same day.   Please bring cases for contacts, glasses, hearing aids, dentures or bridgework because it cannot be worn into surgery.    Leave your suitcase in the car.  After surgery it may be brought to your room.  For patients admitted to the hospital, discharge time will be determined by your treatment team.  Patients discharged the day of surgery will not be allowed to drive home.    Special instructions:   - Preparing For Surgery  Oral Hygiene is also important to reduce your risk of infection.  Remember - BRUSH YOUR TEETH THE MORNING OF SURGERY WITH YOUR REGULAR TOOTHPASTE  Please follow these instructions carefully.   1. Shower the NIGHT BEFORE SURGERY and the MORNING OF SURGERY with DIAL Soap.   2. Wash thoroughly, paying special attention to the area where your surgery will be performed.  3. Thoroughly rinse your body with warm water from the neck down.  4. Pat yourself dry with a CLEAN TOWEL.  5. Wear CLEAN PAJAMAS to bed the night before surgery  6. Place CLEAN SHEETS on your bed the night of your first shower and DO NOT SLEEP WITH PETS.  7. Wear comfortable clothes the morning of surgery.    Day of Surgery:  Please shower the morning of surgery with the DIAL soap Do not apply any deodorants/lotions. Please wear clean clothes to the hospital/surgery center.   Remember to brush your teeth WITH YOUR  REGULAR TOOTHPASTE.   Please read over the following fact sheets that you were given.  Patient denies shortness of breath, fever, cough and chest pain.

## 2020-07-04 ENCOUNTER — Other Ambulatory Visit: Payer: Self-pay

## 2020-07-04 ENCOUNTER — Encounter (HOSPITAL_COMMUNITY): Payer: Self-pay | Admitting: Orthopaedic Surgery

## 2020-07-04 DIAGNOSIS — F411 Generalized anxiety disorder: Secondary | ICD-10-CM

## 2020-07-04 MED ORDER — SERTRALINE HCL 50 MG PO TABS: 50.0000 mg | ORAL_TABLET | Freq: Every day | ORAL | 1 refills | Status: DC

## 2020-07-04 MED ORDER — HYDROXYZINE HCL 10 MG PO TABS: 10.0000 mg | ORAL_TABLET | Freq: Every evening | ORAL | 0 refills | Status: DC | PRN

## 2020-07-04 NOTE — Telephone Encounter (Signed)
Refills sent to pharmacy. 

## 2020-07-05 ENCOUNTER — Ambulatory Visit (HOSPITAL_COMMUNITY)
Admission: RE | Admit: 2020-07-05 | Discharge: 2020-07-05 | Disposition: A | Discharge status: Home / Self Care | Payer: No Typology Code available for payment source | Attending: Orthopaedic Surgery | Admitting: Orthopaedic Surgery

## 2020-07-05 ENCOUNTER — Encounter (HOSPITAL_COMMUNITY): Payer: Self-pay | Admitting: Orthopaedic Surgery

## 2020-07-05 ENCOUNTER — Encounter (HOSPITAL_COMMUNITY)
Admission: RE | Disposition: A | Discharge status: Home / Self Care | Payer: Self-pay | Source: Home / Self Care | Attending: Orthopaedic Surgery

## 2020-07-05 ENCOUNTER — Ambulatory Visit (HOSPITAL_COMMUNITY): Payer: No Typology Code available for payment source | Admitting: Anesthesiology

## 2020-07-05 ENCOUNTER — Ambulatory Visit (HOSPITAL_COMMUNITY): Payer: No Typology Code available for payment source

## 2020-07-05 DIAGNOSIS — W19XXXA Unspecified fall, initial encounter: Secondary | ICD-10-CM | POA: Insufficient documentation

## 2020-07-05 DIAGNOSIS — Z885 Allergy status to narcotic agent status: Secondary | ICD-10-CM | POA: Insufficient documentation

## 2020-07-05 DIAGNOSIS — S42031A Displaced fracture of lateral end of right clavicle, initial encounter for closed fracture: Secondary | ICD-10-CM | POA: Diagnosis not present

## 2020-07-05 DIAGNOSIS — S43101A Unspecified dislocation of right acromioclavicular joint, initial encounter: Secondary | ICD-10-CM

## 2020-07-05 DIAGNOSIS — Z882 Allergy status to sulfonamides status: Secondary | ICD-10-CM | POA: Insufficient documentation

## 2020-07-05 DIAGNOSIS — Z419 Encounter for procedure for purposes other than remedying health state, unspecified: Secondary | ICD-10-CM

## 2020-07-05 HISTORY — PX: ORIF CLAVICULAR FRACTURE: SHX5055

## 2020-07-05 LAB — COMPREHENSIVE METABOLIC PANEL
ALT: 18 U/L (ref 0–44)
AST: 29 U/L (ref 15–41)
Albumin: 4.1 g/dL (ref 3.5–5.0)
Alkaline Phosphatase: 99 U/L (ref 38–126)
Anion gap: 11 (ref 5–15)
BUN: 11 mg/dL (ref 6–20)
CO2: 25 mmol/L (ref 22–32)
Calcium: 9.4 mg/dL (ref 8.9–10.3)
Chloride: 101 mmol/L (ref 98–111)
Creatinine, Ser: 0.86 mg/dL (ref 0.44–1.00)
GFR, Estimated: >60 mL/min (ref >60)
Glucose, Bld: 92 mg/dL (ref 70–99)
Potassium: 4 mmol/L (ref 3.5–5.1)
Sodium: 137 mmol/L (ref 135–145)
Total Bilirubin: 0.5 mg/dL (ref 0.3–1.2)
Total Protein: 7.2 g/dL (ref 6.5–8.1)

## 2020-07-05 LAB — CBC
HCT: 43.1 % (ref 36.0–46.0)
Hemoglobin: 13.6 g/dL (ref 12.0–15.0)
MCH: 29.8 pg (ref 26.0–34.0)
MCHC: 31.6 g/dL (ref 30.0–36.0)
MCV: 94.5 fL (ref 80.0–100.0)
Platelets: 287 10*3/uL (ref 150–400)
RBC: 4.56 MIL/uL (ref 3.87–5.11)
RDW: 14 % (ref 11.5–15.5)
WBC: 5.8 10*3/uL (ref 4.0–10.5)
nRBC: 0 % (ref 0.0–0.2)

## 2020-07-05 SURGERY — OPEN REDUCTION INTERNAL FIXATION (ORIF) CLAVICULAR FRACTURE
Anesthesia: General | Site: Shoulder | Laterality: Right

## 2020-07-05 MED ORDER — DEXAMETHASONE SODIUM PHOSPHATE 10 MG/ML IJ SOLN
INTRAMUSCULAR | Status: DC | PRN
  Administered 2020-07-05: 10 mg via INTRAVENOUS

## 2020-07-05 MED ORDER — CEFAZOLIN SODIUM-DEXTROSE 2-4 GM/100ML-% IV SOLN
2.0000 g | INTRAVENOUS | Status: AC
  Administered 2020-07-05: 2 g via INTRAVENOUS

## 2020-07-05 MED ORDER — PROPOFOL 10 MG/ML IV BOLUS
INTRAVENOUS | Status: DC | PRN
  Administered 2020-07-05: 200 mg via INTRAVENOUS

## 2020-07-05 MED ORDER — MIDAZOLAM HCL 2 MG/2ML IJ SOLN: 2.0000 mg | Freq: Once | INTRAMUSCULAR | Status: AC

## 2020-07-05 MED ORDER — 0.9 % SODIUM CHLORIDE (POUR BTL) OPTIME
TOPICAL | Status: DC | PRN
  Administered 2020-07-05 (×2): 1000 mL

## 2020-07-05 MED ORDER — ROCURONIUM BROMIDE 10 MG/ML (PF) SYRINGE
PREFILLED_SYRINGE | INTRAVENOUS | Status: DC | PRN
  Administered 2020-07-05: 60 mg via INTRAVENOUS
  Administered 2020-07-05: 20 mg via INTRAVENOUS

## 2020-07-05 MED ORDER — PHENYLEPHRINE 40 MCG/ML (10ML) SYRINGE FOR IV PUSH (FOR BLOOD PRESSURE SUPPORT)
PREFILLED_SYRINGE | INTRAVENOUS | Status: DC | PRN
  Administered 2020-07-05: 40 ug via INTRAVENOUS
  Administered 2020-07-05: 80 ug via INTRAVENOUS
  Administered 2020-07-05: 40 ug via INTRAVENOUS

## 2020-07-05 MED ORDER — HYDROMORPHONE HCL 1 MG/ML IJ SOLN
0.2500 mg | INTRAMUSCULAR | Status: DC | PRN
  Administered 2020-07-05: 0.25 mg via INTRAVENOUS
  Administered 2020-07-05: 0.5 mg via INTRAVENOUS

## 2020-07-05 MED ORDER — OXYCODONE HCL 5 MG PO TABS: 5.0000 mg | ORAL_TABLET | Freq: Once | ORAL | Status: DC | PRN

## 2020-07-05 MED ORDER — LACTATED RINGERS IV SOLN: INTRAVENOUS | Status: DC

## 2020-07-05 MED ORDER — FENTANYL CITRATE (PF) 100 MCG/2ML IJ SOLN: 100.0000 ug | Freq: Once | INTRAMUSCULAR | Status: AC

## 2020-07-05 MED ORDER — BUPIVACAINE LIPOSOME 1.3 % IJ SUSP
INTRAMUSCULAR | Status: DC | PRN
  Administered 2020-07-05: 10 mL via PERINEURAL

## 2020-07-05 MED ORDER — DEXAMETHASONE SODIUM PHOSPHATE 10 MG/ML IJ SOLN
INTRAMUSCULAR | Status: AC
  Filled 2020-07-05: qty 1

## 2020-07-05 MED ORDER — MIDAZOLAM HCL 2 MG/2ML IJ SOLN: 0.5000 mg | Freq: Once | INTRAMUSCULAR | Status: DC | PRN

## 2020-07-05 MED ORDER — LIDOCAINE 2% (20 MG/ML) 5 ML SYRINGE
INTRAMUSCULAR | Status: AC
  Filled 2020-07-05: qty 5

## 2020-07-05 MED ORDER — ROCURONIUM BROMIDE 10 MG/ML (PF) SYRINGE
PREFILLED_SYRINGE | INTRAVENOUS | Status: AC
  Filled 2020-07-05: qty 10

## 2020-07-05 MED ORDER — PHENYLEPHRINE HCL-NACL 10-0.9 MG/250ML-% IV SOLN
INTRAVENOUS | Status: DC | PRN
  Administered 2020-07-05: 50 ug/min via INTRAVENOUS

## 2020-07-05 MED ORDER — SUGAMMADEX SODIUM 200 MG/2ML IV SOLN
INTRAVENOUS | Status: DC | PRN
  Administered 2020-07-05: 100 mg via INTRAVENOUS

## 2020-07-05 MED ORDER — HYDROCODONE-ACETAMINOPHEN 7.5-325 MG PO TABS
1.0000 | ORAL_TABLET | Freq: Three times a day (TID) | ORAL | 0 refills | Status: DC | PRN
Start: 2020-07-05 — End: 2020-07-11

## 2020-07-05 MED ORDER — MIDAZOLAM HCL 2 MG/2ML IJ SOLN
INTRAMUSCULAR | Status: AC
  Administered 2020-07-05: 2 mg via INTRAVENOUS
  Filled 2020-07-05: qty 2

## 2020-07-05 MED ORDER — PHENYLEPHRINE 40 MCG/ML (10ML) SYRINGE FOR IV PUSH (FOR BLOOD PRESSURE SUPPORT)
PREFILLED_SYRINGE | INTRAVENOUS | Status: AC
  Filled 2020-07-05: qty 10

## 2020-07-05 MED ORDER — PROPOFOL 10 MG/ML IV BOLUS
INTRAVENOUS | Status: AC
  Filled 2020-07-05: qty 20

## 2020-07-05 MED ORDER — LIDOCAINE 2% (20 MG/ML) 5 ML SYRINGE
INTRAMUSCULAR | Status: DC | PRN
  Administered 2020-07-05: 20 mg via INTRAVENOUS

## 2020-07-05 MED ORDER — HYDROMORPHONE HCL 1 MG/ML IJ SOLN
INTRAMUSCULAR | Status: AC
  Filled 2020-07-05: qty 1

## 2020-07-05 MED ORDER — MEPERIDINE HCL 25 MG/ML IJ SOLN: 6.2500 mg | INTRAMUSCULAR | Status: DC | PRN

## 2020-07-05 MED ORDER — BUPIVACAINE-EPINEPHRINE (PF) 0.5% -1:200000 IJ SOLN
INTRAMUSCULAR | Status: DC | PRN
  Administered 2020-07-05: 10 mL via PERINEURAL

## 2020-07-05 MED ORDER — OXYCODONE HCL 5 MG/5ML PO SOLN: 5.0000 mg | Freq: Once | ORAL | Status: DC | PRN

## 2020-07-05 MED ORDER — FENTANYL CITRATE (PF) 250 MCG/5ML IJ SOLN
INTRAMUSCULAR | Status: AC
  Filled 2020-07-05: qty 5

## 2020-07-05 MED ORDER — CHLORHEXIDINE GLUCONATE 0.12 % MT SOLN: 15.0000 mL | Freq: Once | OROMUCOSAL | Status: AC

## 2020-07-05 MED ORDER — FENTANYL CITRATE (PF) 100 MCG/2ML IJ SOLN
INTRAMUSCULAR | Status: AC
  Administered 2020-07-05: 100 ug via INTRAVENOUS
  Filled 2020-07-05: qty 2

## 2020-07-05 MED ORDER — CHLORHEXIDINE GLUCONATE 0.12 % MT SOLN
OROMUCOSAL | Status: AC
  Administered 2020-07-05: 15 mL via OROMUCOSAL
  Filled 2020-07-05: qty 15

## 2020-07-05 MED ORDER — VANCOMYCIN HCL 1000 MG IV SOLR
INTRAVENOUS | Status: DC | PRN
  Administered 2020-07-05: 1000 mg

## 2020-07-05 MED ORDER — ONDANSETRON HCL 4 MG/2ML IJ SOLN
INTRAMUSCULAR | Status: DC | PRN
  Administered 2020-07-05: 4 mg via INTRAVENOUS

## 2020-07-05 MED ORDER — SCOPOLAMINE 1 MG/3DAYS TD PT72
1.0000 | MEDICATED_PATCH | TRANSDERMAL | Status: DC
  Administered 2020-07-05: 1.5 mg via TRANSDERMAL
  Filled 2020-07-05: qty 1

## 2020-07-05 MED ORDER — VANCOMYCIN HCL 1000 MG IV SOLR
INTRAVENOUS | Status: AC
  Filled 2020-07-05: qty 1000

## 2020-07-05 MED ORDER — ORAL CARE MOUTH RINSE: 15.0000 mL | Freq: Once | OROMUCOSAL | Status: AC

## 2020-07-05 MED ORDER — ONDANSETRON HCL 4 MG/2ML IJ SOLN
INTRAMUSCULAR | Status: AC
  Filled 2020-07-05: qty 2

## 2020-07-05 MED ORDER — MIDAZOLAM HCL 2 MG/2ML IJ SOLN
INTRAMUSCULAR | Status: AC
  Filled 2020-07-05: qty 2

## 2020-07-05 MED ORDER — MIDAZOLAM HCL 2 MG/2ML IJ SOLN
INTRAMUSCULAR | Status: DC | PRN
  Administered 2020-07-05: 2 mg via INTRAVENOUS

## 2020-07-05 MED ORDER — CEFAZOLIN SODIUM-DEXTROSE 2-4 GM/100ML-% IV SOLN
INTRAVENOUS | Status: AC
  Filled 2020-07-05: qty 100

## 2020-07-05 MED ORDER — FENTANYL CITRATE (PF) 250 MCG/5ML IJ SOLN
INTRAMUSCULAR | Status: DC | PRN
  Administered 2020-07-05 (×2): 50 ug via INTRAVENOUS

## 2020-07-05 SURGICAL SUPPLY — 39 items
COVER SURGICAL LIGHT HANDLE (MISCELLANEOUS) ×2 IMPLANT
COVER WAND RF STERILE (DRAPES) ×2 IMPLANT
DRAPE C-ARM 42X72 X-RAY (DRAPES) ×2 IMPLANT
DRAPE U-SHAPE 47X51 STRL (DRAPES) ×2 IMPLANT
DRSG TEGADERM 4X10 (GAUZE/BANDAGES/DRESSINGS) ×1 IMPLANT
DRSG TEGADERM 4X4.75 (GAUZE/BANDAGES/DRESSINGS) ×2 IMPLANT
DRSG XEROFORM 1X8 (GAUZE/BANDAGES/DRESSINGS) ×1 IMPLANT
DURAPREP 26ML APPLICATOR (WOUND CARE) ×2 IMPLANT
ELECT CAUTERY BLADE 6.4 (BLADE) ×2 IMPLANT
ELECT REM PT RETURN 9FT ADLT (ELECTROSURGICAL) ×2
ELECTRODE REM PT RTRN 9FT ADLT (ELECTROSURGICAL) ×1 IMPLANT
GAUZE SPONGE 4X4 12PLY STRL (GAUZE/BANDAGES/DRESSINGS) ×1 IMPLANT
GAUZE XEROFORM 1X8 LF (GAUZE/BANDAGES/DRESSINGS) ×2 IMPLANT
GLOVE BIOGEL PI IND STRL 7.0 (GLOVE) ×1 IMPLANT
GLOVE BIOGEL PI INDICATOR 7.0 (GLOVE) ×1
GLOVE ECLIPSE 7.0 STRL STRAW (GLOVE) ×2 IMPLANT
GLOVE SKINSENSE NS SZ7.5 (GLOVE) ×2
GLOVE SKINSENSE STRL SZ7.5 (GLOVE) ×2 IMPLANT
GOWN STRL REIN XL XLG (GOWN DISPOSABLE) ×4 IMPLANT
IMPL TIGHTROP W/DRV K-LESS (Anchor) IMPLANT
IMPLANT TIGHTROPE W/DRV K-LESS (Anchor) ×2 IMPLANT
KIT BASIN OR (CUSTOM PROCEDURE TRAY) ×2 IMPLANT
KIT TURNOVER KIT B (KITS) ×2 IMPLANT
MANIFOLD NEPTUNE II (INSTRUMENTS) ×2 IMPLANT
NS IRRIG 1000ML POUR BTL (IV SOLUTION) ×2 IMPLANT
PACK SHOULDER (CUSTOM PROCEDURE TRAY) ×2 IMPLANT
PACK UNIVERSAL I (CUSTOM PROCEDURE TRAY) ×1 IMPLANT
PAD ARMBOARD 7.5X6 YLW CONV (MISCELLANEOUS) ×4 IMPLANT
SPONGE LAP 18X18 X RAY DECT (DISPOSABLE) ×1 IMPLANT
STRIP CLOSURE SKIN 1/2X4 (GAUZE/BANDAGES/DRESSINGS) ×2 IMPLANT
SUCTION FRAZIER HANDLE 10FR (MISCELLANEOUS) ×1
SUCTION TUBE FRAZIER 10FR DISP (MISCELLANEOUS) ×1 IMPLANT
SUT ETHILON 4 0 PS 2 18 (SUTURE) ×4 IMPLANT
SUT MNCRL AB 4-0 PS2 18 (SUTURE) ×2 IMPLANT
SUT VIC AB 2-0 CT1 27 (SUTURE) ×4
SUT VIC AB 2-0 CT1 36 (SUTURE) ×1 IMPLANT
SUT VIC AB 2-0 CT1 TAPERPNT 27 (SUTURE) ×1 IMPLANT
SYR CONTROL 10ML LL (SYRINGE) ×1 IMPLANT
UNDERPAD 30X36 HEAVY ABSORB (UNDERPADS AND DIAPERS) ×2 IMPLANT

## 2020-07-05 NOTE — H&P (Signed)
H&P update  The surgical history has been reviewed and remains accurate without interval change.  The patient was re-examined and patient's physiologic condition has not changed significantly in the last 30 days. The condition still exists that makes this procedure necessary. The treatment plan remains the same, without new options for care.  No new pharmacological allergies or types of therapy has been initiated that would change the plan or the appropriateness of the plan.  The patient and/or family understand the potential benefits and risks.  Kelly Reel, MD 07/05/2020 1:57 PM

## 2020-07-05 NOTE — H&P (Signed)
PREOPERATIVE H&P  Chief Complaint: right distal clavicle fracture  HPI: Kelly Walton is a 49 y.o. female who presents for surgical treatment of right distal clavicle fracture.  She denies any changes in medical history.  Past Medical History:  Diagnosis Date  . Acute right lower quadrant pain 06/10/2018  . Alcohol abuse   . Asthma   . Hypertension   . Insomnia   . Pain of upper abdomen 06/21/2017  . Palpitations    a. 05/2019 Echo: EF 60-65%, diast dysfxn; b. 05/2019 Zio: Sinus rhythm-sinus tach. Avg HR 105. 6 SVT episodes - longest 7 beats. Rare PVCs-->Diltiazem added.  Marland Kitchen Spongiotic dermatitis    Past Surgical History:  Procedure Laterality Date  . CESAREAN SECTION    . FRACTURE SURGERY    . ORIF CLAVICULAR FRACTURE Right 06/17/2020   Procedure: OPEN REDUCTION INTERNAL FIXATION (ORIF) RIGHT CLAVICLE FRACTURE, ACROMIOCLAVICULAR SEPARATION;  Surgeon: Tarry Kos, MD;  Location: [facility];  Service: Orthopedics;  Laterality: Right;  . ROBOTIC ASSISTED TOTAL HYSTERECTOMY N/A 03/05/2014   Procedure: ROBOTIC ASSISTED TOTAL HYSTERECTOMY/BILATERAL SALPINGECTOMY;  Surgeon: Serita Kyle, MD;  Location: [facility];  Service: Gynecology;  Laterality: N/A;  . TUBAL LIGATION     Social History   Socioeconomic History  . Marital status: Single    Spouse name: Not on file  . Number of children: Not on file  . Years of education: Not on file  . Highest education level: Not on file  Occupational History  . Not on file  Tobacco Use  . Smoking status: Never Smoker  . Smokeless tobacco: Never Used  Vaping Use  . Vaping Use: Never used  Substance and Sexual Activity  . Alcohol use: Yes    Comment: occasion  . Drug use: No  . Sexual activity: Not on file  Other Topics Concern  . Not on file  Social History Narrative   Single.   2 children, 1 step-son.   Manager at [employer].   Enjoys going to [employer], spending time with family.    Social Determinants of Health    Financial Resource Strain:   . Difficulty of Paying Living Expenses: Not on file  Food Insecurity:   . Worried About Programme researcher, broadcasting/film/video in the Last Year: Not on file  . Ran Out of Food in the Last Year: Not on file  Transportation Needs:   . Lack of Transportation (Medical): Not on file  . Lack of Transportation (Non-Medical): Not on file  Physical Activity:   . Days of Exercise per Week: Not on file  . Minutes of Exercise per Session: Not on file  Stress:   . Feeling of Stress : Not on file  Social Connections:   . Frequency of Communication with Friends and Family: Not on file  . Frequency of Social Gatherings with Friends and Family: Not on file  . Attends Religious Services: Not on file  . Active Member of Clubs or Organizations: Not on file  . Attends Banker Meetings: Not on file  . Marital Status: Not on file   Family History  Problem Relation Age of Onset  . Arthritis Mother   . Arthritis Father   . Lung cancer Father   . Heart attack Father   . Hyperlipidemia Maternal Grandmother   . Stroke Maternal Grandmother   . Hypertension Maternal Grandmother   . Heart attack Maternal Grandmother   . Alcohol abuse Paternal Grandmother   . Arthritis Paternal  Grandmother   . Mental illness Paternal Grandmother   . Breast cancer Maternal Aunt   . Epilepsy Brother    Allergies  Allergen Reactions  . Promethazine Hcl Hives and Rash  . Sulfa Antibiotics Hives and Rash    Pt states sent her to the hospital.  . Trazodone And Nefazodone     Hallucinations    Prior to Admission medications   Medication Sig Start Date End Date Taking? Authorizing Provider  albuterol (VENTOLIN HFA) 108 (90 Base) MCG/ACT inhaler Inhale 2 puffs into the lungs every 6 (six) hours as needed for wheezing or shortness of breath. 04/09/20  Yes Doreene Nest, NP  diclofenac Sodium (VOLTAREN) 1 % GEL Apply 4 g topically 4 (four) times daily as needed. Patient taking differently: Apply 4  g topically 4 (four) times daily as needed (pain).  04/10/20  Yes Hilts, Casimiro Needle, MD  HYDROcodone-acetaminophen (NORCO) 7.5-325 MG tablet Take 1-2 tablets by mouth 2 (two) times daily as needed for moderate pain. Patient taking differently: Take 2-4 tablets by mouth every 4 (four) hours as needed for moderate pain.  07/02/20  Yes Tarry Kos, MD  montelukast (SINGULAIR) 10 MG tablet TAKE 1 TABLET (10 MG TOTAL) BY MOUTH AT BEDTIME. FOR ALLERGIES/ASTHMA. 08/25/19  Yes Doreene Nest, NP  TIADYLT ER 180 MG 24 hr capsule Take 180 mg by mouth at bedtime. 06/21/20  Yes [provider]  tiZANidine (ZANAFLEX) 2 MG tablet TAKE 1-2 TABLETS (2-4 MG TOTAL) BY MOUTH EVERY 6 (SIX) HOURS AS NEEDED FOR MUSCLE SPASMS. Patient taking differently: Take 2 mg by mouth every 6 (six) hours as needed for muscle spasms.  05/02/20  Yes Hilts, Casimiro Needle, MD  calcium-vitamin D (OSCAL WITH D) 500-200 MG-UNIT tablet Take 1 tablet by mouth 3 (three) times daily. Patient not taking: Reported on 07/04/2020 06/17/20   Tarry Kos, MD  diltiazem (CARDIZEM CD) 180 MG 24 hr capsule Take 1 capsule (180 mg total) by mouth daily. Patient not taking: Reported on 07/04/2020 04/04/20 07/03/20  Marisue Ivan D, PA-C  hydrOXYzine (ATARAX/VISTARIL) 10 MG tablet Take 1 tablet (10 mg total) by mouth at bedtime as needed for anxiety. 07/04/20   Doreene Nest, NP  ketorolac (TORADOL) 10 MG tablet Take 1 tablet (10 mg total) by mouth 2 (two) times daily as needed. Patient not taking: Reported on 07/04/2020 06/18/20   Cristie Hem, PA-C  sertraline (ZOLOFT) 50 MG tablet Take 1 tablet (50 mg total) by mouth at bedtime. For anxiety. 07/04/20   Doreene Nest, NP  zinc sulfate 220 (50 Zn) MG capsule Take 1 capsule (220 mg total) by mouth daily. Patient not taking: Reported on 07/04/2020 06/17/20   Tarry Kos, MD  tiZANidine (ZANAFLEX) 2 MG tablet Take 1-2 tablets (2-4 mg total) by mouth every 6 (six) hours as needed for  muscle spasms. 12/06/19   Hilts, Casimiro Needle, MD  tiZANidine (ZANAFLEX) 2 MG tablet TAKE 1-2 TABLETS (2-4 MG TOTAL) BY MOUTH EVERY 6 (SIX) HOURS AS NEEDED FOR MUSCLE SPASMS. 01/18/20   Hilts, Casimiro Needle, MD     Positive ROS: All other systems have been reviewed and were otherwise negative with the exception of those mentioned in the HPI and as above.  Physical Exam: General: Alert, no acute distress Cardiovascular: No pedal edema Respiratory: No cyanosis, no use of accessory musculature GI: abdomen soft Skin: No lesions in the area of chief complaint Neurologic: Sensation intact distally Psychiatric: Patient is competent for consent with normal mood and  affect Lymphatic: no lymphedema  MUSCULOSKELETAL: exam stable  Assessment: right distal clavicle fracture  Plan: Plan for Procedure(s): REVISION OPEN REDUCTION INTERNAL FIXATION (ORIF) RIGHT CLAVICLE FRACTURE, ACROMIOCLAVICULAR SEPARATION  The risks benefits and alternatives were discussed with the patient including but not limited to the risks of nonoperative treatment, versus surgical intervention including infection, bleeding, nerve injury,  blood clots, cardiopulmonary complications, morbidity, mortality, among others, and they were willing to proceed.   Preoperative templating of the joint replacement has been completed, documented, and submitted to the Operating Room personnel in order to optimize intra-operative equipment management.   Glee Arvin, MD 07/05/2020 7:18 AM

## 2020-07-05 NOTE — Anesthesia Procedure Notes (Signed)
Procedure Name: Intubation Performed by: Giovanny Dugal, CRNA Pre-anesthesia Checklist: Patient identified, Patient being monitored, Timeout performed, Emergency Drugs available and Suction available Patient Re-evaluated:Patient Re-evaluated prior to induction Oxygen Delivery Method: Circle System Utilized Preoxygenation: Pre-oxygenation with 100% oxygen Induction Type: IV induction Ventilation: Mask ventilation without difficulty Laryngoscope Size: Miller and 2 Grade View: Grade I Tube type: Oral Tube size: 7.0 mm Number of attempts: 1 Airway Equipment and Method: Stylet Placement Confirmation: ETT inserted through vocal cords under direct vision,  positive ETCO2 and breath sounds checked- equal and bilateral Secured at: 21 cm Tube secured with: Tape Dental Injury: Teeth and Oropharynx as per pre-operative assessment        

## 2020-07-05 NOTE — Anesthesia Preprocedure Evaluation (Addendum)
Anesthesia Evaluation  Patient identified by MRN, date of birth, ID band Patient awake    Reviewed: Allergy & Precautions, NPO status , Patient's Chart, lab work & pertinent test results  History of Anesthesia Complications Negative for: history of anesthetic complications  Airway Mallampati: II  TM Distance: >3 FB Neck ROM: Full    Dental  (+) Teeth Intact, Dental Advisory Given   Pulmonary asthma ,  07/02/2020 SARS coronavirus neg   breath sounds clear to auscultation       Cardiovascular hypertension, Pt. on medications (-) angina Rhythm:Regular Rate:Normal  '20 ECHO: EF 60-65%, no significant valvular abnormalities   Neuro/Psych Anxiety negative neurological ROS     GI/Hepatic negative GI ROS, (+)     substance abuse  alcohol use, H/o Elevated LFTs   Endo/Other  negative endocrine ROS  Renal/GU negative Renal ROS     Musculoskeletal   Abdominal   Peds  Hematology negative hematology ROS (+)   Anesthesia Other Findings   Reproductive/Obstetrics                            Anesthesia Physical Anesthesia Plan  ASA: II  Anesthesia Plan: General   Post-op Pain Management: GA combined w/ Regional for post-op pain   Induction: Intravenous  PONV Risk Score and Plan: 3 and Ondansetron, Dexamethasone and Treatment may vary due to age or medical condition  Airway Management Planned: Oral ETT  Additional Equipment: None  Intra-op Plan:   Post-operative Plan: Extubation in OR  Informed Consent: I have reviewed the patients History and Physical, chart, labs and discussed the procedure including the risks, benefits and alternatives for the proposed anesthesia with the patient or authorized representative who has indicated his/her understanding and acceptance.     Dental advisory given  Plan Discussed with: CRNA and Surgeon  Anesthesia Plan Comments: (Plan routine monitors, GETA  with interscalene block for post op analgesia)       Anesthesia Quick Evaluation

## 2020-07-05 NOTE — Op Note (Signed)
   Date of Surgery: 07/05/2020  INDICATIONS: Kelly Walton is a 49 y.o.-year-old female who underwent ORIF right distal clavicle fracture with [facility] separation reconstruction.  Unfortunately she sustained another fall and x-rays in the office during her first postoperative visit showed a displaced AC separation.  The fixation appeared to be intact and therefore we felt that the tight rope device had failed.  Based on our discussion of treatment options I recommended revision tight rope fixation and evaluation of the hardware.  The patient did consent to the procedure after discussion of the risks and benefits.  PREOPERATIVE DIAGNOSIS: AC separation status post ORIF right distal clavicle fracture with failure of tight rope device  POSTOPERATIVE DIAGNOSIS: Same.  PROCEDURE: Revision repair of AC separation with tight rope device  SURGEON: N. Glee Arvin, M.D.  ASSIST: Starlyn Skeans [place], [place]; necessary for the timely completion of procedure and due to complexity of procedure.  ANESTHESIA:  general  IV FLUIDS AND URINE: See anesthesia.  ESTIMATED BLOOD LOSS: 150 mL.  IMPLANTS: Arthrex tight rope  DRAINS: None  COMPLICATIONS: see description of procedure.  DESCRIPTION OF PROCEDURE: The patient was brought to the operating room.  The patient had been signed prior to the procedure and this was documented. The patient had the anesthesia placed by the anesthesiologist.  A time-out was performed to confirm that this was the correct patient, site, side and location. The patient did receive antibiotics prior to the incision and was re-dosed during the procedure as needed at indicated intervals.   The patient had the operative extremity prepped and draped in the standard surgical fashion.    The previous surgical incision was incised.  Dissection was carried down through the scar tissue.  All suture knots were removed.  Full-thickness flaps were elevated off of the clavicle and hardware.  Fixation  across the clavicle fracture in the distal fragment was intact.  The [facility] joint was grossly unstable.  Evaluation of the tight rope showed that there was failure of the tight rope device.  There was slack in the sutures.  Dissection was carried down through the old scar tissue.  Coracoid was exposed.  Cc interval was reduced and confirmed under fluoroscopy.  A new tight rope was then drilled through an available hole through the plate down through the coracoid.  Tight rope was passed from superior to inferior and the button was flipped inferior to the coracoid.  The tight rope was then tightened down.  Couple of knots were placed for extra fixation.  Final x-rays were taken.   Surgical wound was thoroughly irrigated.  500 mg of vancomycin powder placed in the wound.  Layered closure was performed.  Sterile dressings applied.  Shoulder immobilizer placed.  Patient tolerated procedure well had no immediate complications.  POSTOPERATIVE PLAN: Nonweightbearing.  Sling at all times.  Follow-up in 2 weeks.  Kelly Reel, MD 3:36 PM

## 2020-07-05 NOTE — Anesthesia Procedure Notes (Signed)
Anesthesia Regional Block: Interscalene brachial plexus block   Pre-Anesthetic Checklist: ,, timeout performed, Correct Patient, Correct Site, Correct Laterality, Correct Procedure, Correct Position, site marked, Risks and benefits discussed,  Surgical consent,  Pre-op evaluation,  At surgeon's request and post-op pain management  Laterality: Right and Upper  Prep: chloraprep       Needles:  Injection technique: Single-shot  Needle Type: Echogenic Needle     Needle Length: 9cm  Needle Gauge: 21     Additional Needles:   Procedures:,,,, ultrasound used (permanent image in chart),,,,  Narrative:  Start time: 07/05/2020 12:38 PM End time: 07/05/2020 12:45 PM Injection made incrementally with aspirations every 5 mL.  Performed by: Personally  Anesthesiologist: Jairo Ben, MD  Additional Notes: Pt identified in Holding room.  Monitors applied. Working IV access confirmed. Sterile prep R neck.  #21ga ECHOgenic Arrow block needle to interscalene brachial plexus with US guidance.  10cc 0.5% Bupivacaine with 1:200k epi and Exparel injected incrementally after negative test dose.  Patient asymptomatic, VSS, no heme aspirated, tolerated well.  Sandford Craze, MD

## 2020-07-05 NOTE — Discharge Instructions (Signed)
   Postoperative instructions:  Weightbearing instructions: non weight bearing  Keep your dressing and/or splint clean and dry at all times.  You can remove your dressing on post-operative day #3 and change with a dry/sterile dressing or Band-Aids as needed thereafter.    Incision instructions:  Do not soak your incision for 3 weeks after surgery.  If the incision gets wet, pat dry and do not scrub the incision.  Pain control:  You have been given a prescription to be taken as directed for post-operative pain control.  In addition, elevate the operative extremity above the heart at all times to prevent swelling and throbbing pain.  Take over-the-counter Colace, 100mg by mouth twice a day while taking narcotic pain medications to help prevent constipation.  Follow up appointments: 1) 14 days for suture removal and wound check. 2) Dr. Graclynn Vanantwerp as scheduled.   -------------------------------------------------------------------------------------------------------------  After Surgery Pain Control:  After your surgery, post-surgical discomfort or pain is likely. This discomfort can last several days to a few weeks. At certain times of the day your discomfort may be more intense.  Did you receive a nerve block?  A nerve block can provide pain relief for one hour to two days after your surgery. As long as the nerve block is working, you will experience little or no sensation in the area the surgeon operated on.  As the nerve block wears off, you will begin to experience pain or discomfort. It is very important that you begin taking your prescribed pain medication before the nerve block fully wears off. Treating your pain at the first sign of the block wearing off will ensure your pain is better controlled and more tolerable when full-sensation returns. Do not wait until the pain is intolerable, as the medicine will be less effective. It is better to treat pain in advance than to try and catch up.    General Anesthesia:  If you did not receive a nerve block during your surgery, you will need to start taking your pain medication shortly after your surgery and should continue to do so as prescribed by your surgeon.  Pain Medication:  Most commonly we prescribe Vicodin and Percocet for post-operative pain. Both of these medications contain a combination of acetaminophen (Tylenol) and a narcotic to help control pain.   It takes between 30 and 45 minutes before pain medication starts to work. It is important to take your medication before your pain level gets too intense.   Nausea is a common side effect of many pain medications. You will want to eat something before taking your pain medicine to help prevent nausea.   If you are taking a prescription pain medication that contains acetaminophen, we recommend that you do not take additional over the counter acetaminophen (Tylenol).  Other pain relieving options:   Using a cold pack to ice the affected area a few times a day (15 to 20 minutes at a time) can help to relieve pain, reduce swelling and bruising.   Elevation of the affected area can also help to reduce pain and swelling.  

## 2020-07-05 NOTE — Transfer of Care (Signed)
Immediate Anesthesia Transfer of Care Note  Patient: Kelly Walton  Procedure(s) Performed: REVISION OPEN REDUCTION INTERNAL FIXATION (ORIF) RIGHT CLAVICLE FRACTURE, ACROMIOCLAVICULAR SEPARATION (Right Shoulder)  Patient Location: PACU  Anesthesia Type:General  Level of Consciousness: awake, alert , oriented and patient cooperative  Airway & Oxygen Therapy: Patient Spontanous Breathing  Post-op Assessment: Report given to RN and Post -op Vital signs reviewed and stable  Post vital signs: Reviewed and stable  Last Vitals:  Vitals Value Taken Time  BP 139/85 07/05/20 1600  Temp 36.1 C 07/05/20 1600  Pulse 93 07/05/20 1601  Resp 37 07/05/20 1601  SpO2 93 % 07/05/20 1601  Vitals shown include unvalidated device data.  Last Pain:  Vitals:   07/05/20 1250  TempSrc:   PainSc: 0-No pain      Patients Stated Pain Goal: 3 (07/05/20 1240)  Complications: No complications documented.

## 2020-07-05 NOTE — Progress Notes (Signed)
Wasted 0.25 mg dilaudid in stericycle. Witnessed by Natalia Leatherwood, RN.

## 2020-07-08 ENCOUNTER — Encounter (HOSPITAL_COMMUNITY): Payer: Self-pay | Admitting: Orthopaedic Surgery

## 2020-07-08 DIAGNOSIS — S43101A Unspecified dislocation of right acromioclavicular joint, initial encounter: Secondary | ICD-10-CM

## 2020-07-08 NOTE — Anesthesia Postprocedure Evaluation (Signed)
Anesthesia Post Note  Patient: Kelly Walton  Procedure(s) Performed: REVISION OPEN REDUCTION INTERNAL FIXATION (ORIF) RIGHT CLAVICLE FRACTURE, ACROMIOCLAVICULAR SEPARATION (Right Shoulder)     Patient location during evaluation: PACU Anesthesia Type: General Level of consciousness: awake and alert Pain management: pain level controlled Vital Signs Assessment: post-procedure vital signs reviewed and stable Respiratory status: spontaneous breathing, nonlabored ventilation, respiratory function stable and patient connected to nasal cannula oxygen Cardiovascular status: blood pressure returned to baseline and stable Postop Assessment: no apparent nausea or vomiting Anesthetic complications: no   No complications documented.  Last Vitals:  Vitals:   07/05/20 1645 07/05/20 1700  BP: 124/83 135/81  Pulse: 86 95  Resp: 19 20  Temp:  (!) 36.1 C  SpO2: 95% 93%    Last Pain:  Vitals:   07/05/20 1700  TempSrc:   PainSc: 8                  Garcia Dalzell S

## 2020-07-09 ENCOUNTER — Telehealth: Payer: Self-pay | Admitting: Orthopaedic Surgery

## 2020-07-09 NOTE — Telephone Encounter (Signed)
[employer] forms received. Sent to [employer] 

## 2020-07-10 ENCOUNTER — Telehealth: Payer: Self-pay | Admitting: Orthopaedic Surgery

## 2020-07-10 NOTE — Telephone Encounter (Signed)
Received cal from [place] with [employer] needing to confirm that patient had surgery 07/05/2020. The number to contact Riki Rusk is [phone]    Claim# [id number]

## 2020-07-10 NOTE — Telephone Encounter (Signed)
Called [employer] back. Advised them patient did have SU 07/05/2020.

## 2020-07-11 ENCOUNTER — Other Ambulatory Visit: Payer: Self-pay | Admitting: Physician Assistant

## 2020-07-11 ENCOUNTER — Telehealth: Payer: Self-pay | Admitting: *Deleted

## 2020-07-11 ENCOUNTER — Encounter: Payer: Self-pay | Admitting: Orthopaedic Surgery

## 2020-07-11 MED ORDER — HYDROCODONE-ACETAMINOPHEN 7.5-325 MG PO TABS: 1.0000 | ORAL_TABLET | Freq: Three times a day (TID) | ORAL | 0 refills | Status: DC | PRN

## 2020-07-11 NOTE — Telephone Encounter (Signed)
Mychart msg was sent

## 2020-07-11 NOTE — Telephone Encounter (Signed)
Pt has surgery on 10/29 R clavicle states she is having some severe pain in that area, no redness, sensitive around the bandage, had to change the bandage twice but no blood this time. Wants to make sure everything is ok, since this is her 2nd surgery on same thing. Please advise.   CB: [phone]

## 2020-07-11 NOTE — Telephone Encounter (Signed)
Everything sounds to be reassuring.  Does she need a refill on pain medications or muscle relaxers?

## 2020-07-11 NOTE — Telephone Encounter (Signed)
I sent in refill.  If no new, injury she does not need to come in for xray until po appointment

## 2020-07-15 ENCOUNTER — Other Ambulatory Visit: Payer: Self-pay | Admitting: Primary Care

## 2020-07-15 ENCOUNTER — Other Ambulatory Visit: Payer: Self-pay

## 2020-07-15 DIAGNOSIS — J4520 Mild intermittent asthma, uncomplicated: Secondary | ICD-10-CM

## 2020-07-19 ENCOUNTER — Ambulatory Visit (INDEPENDENT_AMBULATORY_CARE_PROVIDER_SITE_OTHER): Payer: No Typology Code available for payment source

## 2020-07-19 ENCOUNTER — Ambulatory Visit (INDEPENDENT_AMBULATORY_CARE_PROVIDER_SITE_OTHER): Payer: No Typology Code available for payment source | Admitting: Orthopaedic Surgery

## 2020-07-19 DIAGNOSIS — Z9889 Other specified postprocedural states: Secondary | ICD-10-CM

## 2020-07-19 DIAGNOSIS — Z8781 Personal history of (healed) traumatic fracture: Secondary | ICD-10-CM

## 2020-07-19 MED ORDER — HYDROCODONE-ACETAMINOPHEN 5-325 MG PO TABS: 1.0000 | ORAL_TABLET | Freq: Every day | ORAL | 0 refills | Status: DC | PRN

## 2020-07-19 NOTE — Progress Notes (Signed)
Post-Op Visit Note   Patient: Kelly Walton           Date of Birth: 1970/12/08           MRN: [id number] Visit Date: 07/19/2020 PCP: Doreene Nest, NP   Assessment & Plan:  Chief Complaint:  Chief Complaint  Patient presents with   Right Arm - Routine Post Op   Visit Diagnoses:  1. S/P ORIF (open reduction internal fixation) fracture     Plan: Quianna is 2 weeks status post revision distal clavicle fixation AC separation.  She is overall doing well and the pain is mild.  Surgical incision is healed no signs of infection.  Sutures removed and Steri-Strips applied.  Given the poor quality of bone I would like to take it slow with her and we will continue to have her wear the sling at all times for the next 4 weeks.  She can perform gentle range of motion by the side of her body.  Hydrocodone refilled today.  Follow-Up Instructions: Return in about 4 weeks (around 08/16/2020).   Orders:  Orders Placed This Encounter  Procedures   XR Clavicle Right   Meds ordered this encounter  Medications   HYDROcodone-acetaminophen (NORCO) 5-325 MG tablet    Sig: Take 1-2 tablets by mouth daily as needed.    Dispense:  30 tablet    Refill:  0    Imaging: XR Clavicle Right  Result Date: 07/19/2020 Stable tight rope fixation and clavicle fixation.  No complications.   PMFS History: Patient Active Problem List   Diagnosis Date Noted   AC separation, right, initial encounter    Traumatic closed fracture of distal clavicle with minimal displacement, right, initial encounter 06/11/2020   Dyspareunia, female 04/24/2020   Periumbilical abdominal pain 03/12/2020   Diarrhea 03/12/2020   Toe pain, chronic, left 02/28/2020   GAD (generalized anxiety disorder) 01/02/2020   Alcohol abuse 01/02/2020   Hand injury, right, initial encounter 09/26/2019   Palpitations 04/03/2019   Seasonal allergies 01/16/2019   Elevated LFTs 11/15/2018   Spongiotic dermatitis 07/08/2018     Hyperlipidemia 07/08/2018   Asthma 08/20/2017   Preventative health care 06/21/2017   Insomnia 04/16/2017   Essential hypertension 04/16/2017   Past Medical History:  Diagnosis Date   Acute right lower quadrant pain 06/10/2018   Alcohol abuse    Asthma    Hypertension    Insomnia    Pain of upper abdomen 06/21/2017   Palpitations    a. 05/2019 Echo: EF 60-65%, diast dysfxn; b. 05/2019 Zio: Sinus rhythm-sinus tach. Avg HR 105. 6 SVT episodes - longest 7 beats. Rare PVCs-->Diltiazem added.   Spongiotic dermatitis     Family History  Problem Relation Age of Onset   Arthritis Mother    Arthritis Father    Lung cancer Father    Heart attack Father    Hyperlipidemia Maternal Grandmother    Stroke Maternal Grandmother    Hypertension Maternal Grandmother    Heart attack Maternal Grandmother    Alcohol abuse Paternal Grandmother    Arthritis Paternal Grandmother    Mental illness Paternal Grandmother    Breast cancer Maternal Aunt    Epilepsy Brother     Past Surgical History:  Procedure Laterality Date   CESAREAN SECTION     FRACTURE SURGERY     ORIF CLAVICULAR FRACTURE Right 06/17/2020   Procedure: OPEN REDUCTION INTERNAL FIXATION (ORIF) RIGHT CLAVICLE FRACTURE, ACROMIOCLAVICULAR SEPARATION;  Surgeon: Tarry Kos, MD;  Location:  [facility];  Service: Orthopedics;  Laterality: Right;   ORIF CLAVICULAR FRACTURE Right 07/05/2020   Procedure: REVISION OPEN REDUCTION INTERNAL FIXATION (ORIF) RIGHT CLAVICLE FRACTURE, ACROMIOCLAVICULAR SEPARATION;  Surgeon: Tarry Kos, MD;  Location: [facility];  Service: Orthopedics;  Laterality: Right;   ROBOTIC ASSISTED TOTAL HYSTERECTOMY N/A 03/05/2014   Procedure: ROBOTIC ASSISTED TOTAL HYSTERECTOMY/BILATERAL SALPINGECTOMY;  Surgeon: Serita Kyle, MD;  Location: [facility];  Service: Gynecology;  Laterality: N/A;   TUBAL LIGATION     Social History   Occupational History   Not on file   Tobacco Use   Smoking status: Never Smoker   Smokeless tobacco: Never Used  Vaping Use   Vaping Use: Never used  Substance and Sexual Activity   Alcohol use: Yes    Comment: occasion   Drug use: No   Sexual activity: Not on file

## 2020-07-24 ENCOUNTER — Encounter: Payer: Self-pay | Admitting: Orthopaedic Surgery

## 2020-07-24 ENCOUNTER — Other Ambulatory Visit: Payer: Self-pay | Admitting: Orthopaedic Surgery

## 2020-07-24 MED ORDER — HYDROCODONE-ACETAMINOPHEN 5-325 MG PO TABS: 1.0000 | ORAL_TABLET | Freq: Every day | ORAL | 0 refills | Status: DC | PRN

## 2020-07-29 ENCOUNTER — Other Ambulatory Visit: Payer: Self-pay | Admitting: Orthopaedic Surgery

## 2020-07-29 MED ORDER — TRAMADOL HCL 50 MG PO TABS
50.0000 mg | ORAL_TABLET | Freq: Every day | ORAL | 0 refills | Status: DC | PRN
Start: 2020-07-29 — End: 2020-08-12

## 2020-08-03 ENCOUNTER — Other Ambulatory Visit: Payer: Self-pay | Admitting: Family Medicine

## 2020-08-07 ENCOUNTER — Other Ambulatory Visit: Payer: Self-pay | Admitting: Primary Care

## 2020-08-07 DIAGNOSIS — J4520 Mild intermittent asthma, uncomplicated: Secondary | ICD-10-CM

## 2020-08-07 NOTE — Telephone Encounter (Signed)
Called and left patient voicemail to return call to office to check on her wheezing since we got a prescription refill request on her inhaler that was just filled on 07/15/2020. Awaiting call back.

## 2020-08-09 NOTE — Telephone Encounter (Signed)
Called and left voicemail for patient to return call to office.  °

## 2020-08-12 ENCOUNTER — Encounter: Payer: Self-pay | Admitting: Primary Care

## 2020-08-12 ENCOUNTER — Telehealth: Payer: Self-pay | Admitting: Orthopaedic Surgery

## 2020-08-12 ENCOUNTER — Other Ambulatory Visit: Payer: Self-pay

## 2020-08-12 ENCOUNTER — Telehealth (INDEPENDENT_AMBULATORY_CARE_PROVIDER_SITE_OTHER): Payer: No Typology Code available for payment source | Admitting: Primary Care

## 2020-08-12 DIAGNOSIS — J4520 Mild intermittent asthma, uncomplicated: Secondary | ICD-10-CM | POA: Diagnosis not present

## 2020-08-12 MED ORDER — FLOVENT HFA 110 MCG/ACT IN AERO: 1.0000 | INHALATION_SPRAY | Freq: Two times a day (BID) | RESPIRATORY_TRACT | 2 refills | Status: DC

## 2020-08-12 NOTE — Addendum Note (Signed)
Addended by: Doreene Nest on: 08/12/2020 09:47 AM   Modules accepted: Orders

## 2020-08-12 NOTE — Telephone Encounter (Signed)
Called patient does not need refill at this time. Increased symptoms have made virtual today for follow up with Jae Dire.

## 2020-08-12 NOTE — Progress Notes (Signed)
Subjective:    Patient ID: Kelly Walton, female    DOB: 1970/11/28, 49 y.o.   MRN: [id number]  HPI  Virtual Visit via Video Note  I connected with Kelly Walton on 08/12/20 at  9:20 AM EST by a video enabled telemedicine application and verified that I am speaking with the correct person using two identifiers.  Location: Patient: Home Provider: Office Participants: patient and myself   I discussed the limitations of evaluation and management by telemedicine and the availability of in person appointments. The patient expressed understanding and agreed to proceed.  History of Present Illness:  Kelly Walton is a 49 year old female with a history of asthma who presents today with a chief complaint of wheezing.  She is currently managed on albuterol PRN and montelukast 10 mg daily for asthma/allergies.   She's been living in a RV for 2-3 months while her house is being built and started noticing symptoms of wheezing, cough (especially at night) and shortness of breath since. Her RV is on her 14 acre plot of land. there's a lot of dust and debris with moving soil and trees. She should be in her home within the next few months.   When she's at work she has no symptoms. She's been using her inhaler several times weekly with improvement sometimes. She is compliant to her montelukast nightly.   She's never been on a maintenance inhaler for asthma.    Observations/Objective:  Alert and oriented. Appears well, not sickly. No distress. Speaking in complete sentences. No cough during visit  Assessment and Plan:  Uncontrolled asthma requiring maintenance treatment.  Suspect this is all secondary to her living environment as her home is being constructed.   Rx for Flovent inhaler sent to pharmacy. Discussed to rinse mouth after each use. Continue albuterol PRN. Continue Singulair 10 mg.  She will update.   Follow Up Instructions:  Start fluticasone (Flovent) inhaler for asthma.  Inhale 1 puff into the lungs twice daily. Be sure to rinse mouth after each use.  Use the albuterol inhaler as needed. Continue Singulair 10 mg.   Please update me if no improvement.   It was a pleasure to see you today! Kelly Reel, NP-C    I discussed the assessment and treatment plan with the patient. The patient was provided an opportunity to ask questions and all were answered. The patient agreed with the plan and demonstrated an understanding of the instructions.   The patient was advised to call back or seek an in-person evaluation if the symptoms worsen or if the condition fails to improve as anticipated.    Kelly Nest, NP    Review of Systems  Constitutional: Negative for fever.  HENT: Negative for trouble swallowing.   Respiratory: Positive for cough, shortness of breath and wheezing.   Allergic/Immunologic: Positive for environmental allergies.       Past Medical History:  Diagnosis Date  . Acute right lower quadrant pain 06/10/2018  . Alcohol abuse   . Asthma   . Hypertension   . Insomnia   . Pain of upper abdomen 06/21/2017  . Palpitations    a. 05/2019 Echo: EF 60-65%, diast dysfxn; b. 05/2019 Zio: Sinus rhythm-sinus tach. Avg HR 105. 6 SVT episodes - longest 7 beats. Rare PVCs-->Diltiazem added.  Marland Kitchen Spongiotic dermatitis      Social History   Socioeconomic History  . Marital status: Single    Spouse name: Not on file  . Number of children:  Not on file  . Years of education: Not on file  . Highest education level: Not on file  Occupational History  . Not on file  Tobacco Use  . Smoking status: Never Smoker  . Smokeless tobacco: Never Used  Vaping Use  . Vaping Use: Never used  Substance and Sexual Activity  . Alcohol use: Yes    Comment: occasion  . Drug use: No  . Sexual activity: Not on file  Other Topics Concern  . Not on file  Social History Narrative   Single.   2 children, 1 step-son.   Manager at [employer].   Enjoys going to [employer],  spending time with family.    Social Determinants of Health   Financial Resource Strain:   . Difficulty of Paying Living Expenses: Not on file  Food Insecurity:   . Worried About Programme researcher, broadcasting/film/video in the Last Year: Not on file  . Ran Out of Food in the Last Year: Not on file  Transportation Needs:   . Lack of Transportation (Medical): Not on file  . Lack of Transportation (Non-Medical): Not on file  Physical Activity:   . Days of Exercise per Week: Not on file  . Minutes of Exercise per Session: Not on file  Stress:   . Feeling of Stress : Not on file  Social Connections:   . Frequency of Communication with Friends and Family: Not on file  . Frequency of Social Gatherings with Friends and Family: Not on file  . Attends Religious Services: Not on file  . Active Member of Clubs or Organizations: Not on file  . Attends Banker Meetings: Not on file  . Marital Status: Not on file  Intimate Partner Violence:   . Fear of Current or Ex-Partner: Not on file  . Emotionally Abused: Not on file  . Physically Abused: Not on file  . Sexually Abused: Not on file    Past Surgical History:  Procedure Laterality Date  . CESAREAN SECTION    . FRACTURE SURGERY    . ORIF CLAVICULAR FRACTURE Right 06/17/2020   Procedure: OPEN REDUCTION INTERNAL FIXATION (ORIF) RIGHT CLAVICLE FRACTURE, ACROMIOCLAVICULAR SEPARATION;  Surgeon: Tarry Kos, MD;  Location: [facility];  Service: Orthopedics;  Laterality: Right;  . ORIF CLAVICULAR FRACTURE Right 07/05/2020   Procedure: REVISION OPEN REDUCTION INTERNAL FIXATION (ORIF) RIGHT CLAVICLE FRACTURE, ACROMIOCLAVICULAR SEPARATION;  Surgeon: Tarry Kos, MD;  Location: [facility];  Service: Orthopedics;  Laterality: Right;  . ROBOTIC ASSISTED TOTAL HYSTERECTOMY N/A 03/05/2014   Procedure: ROBOTIC ASSISTED TOTAL HYSTERECTOMY/BILATERAL SALPINGECTOMY;  Surgeon: Serita Kyle, MD;  Location: [facility];  Service: Gynecology;  Laterality:  N/A;  . TUBAL LIGATION      Family History  Problem Relation Age of Onset  . Arthritis Mother   . Arthritis Father   . Lung cancer Father   . Heart attack Father   . Hyperlipidemia Maternal Grandmother   . Stroke Maternal Grandmother   . Hypertension Maternal Grandmother   . Heart attack Maternal Grandmother   . Alcohol abuse Paternal Grandmother   . Arthritis Paternal Grandmother   . Mental illness Paternal Grandmother   . Breast cancer Maternal Aunt   . Epilepsy Brother     Allergies  Allergen Reactions  . Promethazine Hcl Hives and Rash  . Sulfa Antibiotics Hives and Rash    Pt states sent her to the hospital.  . Trazodone And Nefazodone     Hallucinations  Current Outpatient Medications on File Prior to Visit  Medication Sig Dispense Refill  . albuterol (VENTOLIN HFA) 108 (90 Base) MCG/ACT inhaler TAKE 2 PUFFS BY MOUTH EVERY 6 HOURS AS NEEDED FOR WHEEZE OR SHORTNESS OF BREATH 6.7 each 0  . calcium-vitamin D (OSCAL WITH D) 500-200 MG-UNIT tablet Take 1 tablet by mouth 3 (three) times daily. 90 tablet 6  . HYDROcodone-acetaminophen (NORCO) 5-325 MG tablet Take 1-2 tablets by mouth daily as needed. 30 tablet 0  . hydrOXYzine (ATARAX/VISTARIL) 10 MG tablet Take 1 tablet (10 mg total) by mouth at bedtime as needed for anxiety. 90 tablet 0  . montelukast (SINGULAIR) 10 MG tablet TAKE 1 TABLET (10 MG TOTAL) BY MOUTH AT BEDTIME. FOR ALLERGIES/ASTHMA. 90 tablet 3  . TIADYLT ER 180 MG 24 hr capsule Take 180 mg by mouth at bedtime.    Marland Kitchen zinc sulfate 220 (50 Zn) MG capsule Take 1 capsule (220 mg total) by mouth daily. 42 capsule 0  . sertraline (ZOLOFT) 50 MG tablet Take 1 tablet (50 mg total) by mouth at bedtime. For anxiety. (Patient not taking: Reported on 08/12/2020) 90 tablet 1  . [DISCONTINUED] tiZANidine (ZANAFLEX) 2 MG tablet Take 1-2 tablets (2-4 mg total) by mouth every 6 (six) hours as needed for muscle spasms. 60 tablet 1  . [DISCONTINUED] tiZANidine (ZANAFLEX) 2 MG  tablet TAKE 1-2 TABLETS (2-4 MG TOTAL) BY MOUTH EVERY 6 (SIX) HOURS AS NEEDED FOR MUSCLE SPASMS. 60 tablet 1  . [DISCONTINUED] tiZANidine (ZANAFLEX) 2 MG tablet TAKE 1-2 TABLETS (2-4 MG TOTAL) BY MOUTH EVERY 6 (SIX) HOURS AS NEEDED FOR MUSCLE SPASMS. (Patient taking differently: Take 2 mg by mouth every 6 (six) hours as needed for muscle spasms. ) 360 tablet 1   No current facility-administered medications on file prior to visit.    Ht 5\' 4"  (1.626 m)   Wt 116 lb (52.6 kg)   LMP 03/05/2014 Comment: LMP x 98yrs ago   BMI 19.91 kg/m    Objective:   Physical Exam Constitutional:      General: She is not in acute distress.    Appearance: Normal appearance. She is not ill-appearing.  Pulmonary:     Effort: Pulmonary effort is normal.     Comments: No cough during visit. Neurological:     Mental Status: She is alert and oriented to person, place, and time.            Assessment & Plan:

## 2020-08-12 NOTE — Telephone Encounter (Signed)
Called pt 1X left vm to call and set appt with Dr. Roda Shutters for follow up. Will try back later

## 2020-08-12 NOTE — Patient Instructions (Signed)
Start fluticasone (Flovent) inhaler for asthma. Inhale 1 puff into the lungs twice daily. Be sure to rinse mouth after each use.  Use the albuterol inhaler as needed. Continue Singulair 10 mg.   Please update me if no improvement.   It was a pleasure to see you today! [name], NP-C

## 2020-08-12 NOTE — Assessment & Plan Note (Signed)
Uncontrolled asthma requiring maintenance treatment.  Suspect this is all secondary to her living environment as her home is being constructed.   Rx for Flovent inhaler sent to pharmacy. Discussed to rinse mouth after each use. Continue albuterol PRN. Continue Singulair 10 mg.

## 2020-08-14 ENCOUNTER — Encounter: Payer: Self-pay | Admitting: Orthopaedic Surgery

## 2020-08-14 ENCOUNTER — Other Ambulatory Visit: Payer: Self-pay | Admitting: Orthopaedic Surgery

## 2020-08-14 MED ORDER — TRAMADOL HCL 50 MG PO TABS: 50.0000 mg | ORAL_TABLET | Freq: Every day | ORAL | 0 refills | Status: DC | PRN

## 2020-08-20 ENCOUNTER — Encounter: Payer: Self-pay | Admitting: Orthopaedic Surgery

## 2020-08-20 ENCOUNTER — Ambulatory Visit (INDEPENDENT_AMBULATORY_CARE_PROVIDER_SITE_OTHER): Payer: No Typology Code available for payment source

## 2020-08-20 ENCOUNTER — Ambulatory Visit (INDEPENDENT_AMBULATORY_CARE_PROVIDER_SITE_OTHER): Payer: No Typology Code available for payment source | Admitting: Orthopaedic Surgery

## 2020-08-20 ENCOUNTER — Other Ambulatory Visit: Payer: Self-pay

## 2020-08-20 DIAGNOSIS — S42031A Displaced fracture of lateral end of right clavicle, initial encounter for closed fracture: Secondary | ICD-10-CM

## 2020-08-20 DIAGNOSIS — Z9889 Other specified postprocedural states: Secondary | ICD-10-CM

## 2020-08-20 DIAGNOSIS — Z8781 Personal history of (healed) traumatic fracture: Secondary | ICD-10-CM

## 2020-08-20 DIAGNOSIS — S43101A Unspecified dislocation of right acromioclavicular joint, initial encounter: Secondary | ICD-10-CM

## 2020-08-20 NOTE — Progress Notes (Signed)
Post-Op Visit Note   Patient: Kelly Walton           Date of Birth: 10-Sep-1970           MRN: [id number] Visit Date: 08/20/2020 PCP: Doreene Nest, NP   Assessment & Plan:  Chief Complaint:  Chief Complaint  Patient presents with  . Right Arm - Follow-up, Routine Post Op   Visit Diagnoses:  1. S/P ORIF (open reduction internal fixation) fracture   2. Traumatic closed fracture of distal clavicle with minimal displacement, right, initial encounter   3. AC separation, right, initial encounter     Plan:   Kelly Walton is approximately 6 weeks status post revision ORIF right clavicle fracture and CC ligament reconstruction. Overall she is improving. The skin around the surgical incision still very sensitive and she has trouble putting on her bra strap. Range of motion is improving. Pain medication requirement is also improving.  Surgical incision is healed. She has a couple of punctate areas of a dry scab. No signs of infection. She is very sensitive to touch around the surgical scar. Should she is able to elevate the arm up to the level of the shoulder. X-rays shows stable fixation alignment without any complications. Fracture appears to be consolidating and healing.  At this point she can wean the sling. We will place her in outpatient PT in our office. She has already returned back to work. Recheck in 6 weeks with two-view x-rays of the right clavicle.  Follow-Up Instructions: Return in about 6 weeks (around 10/01/2020).   Orders:  Orders Placed This Encounter  Procedures  . XR Clavicle Right  . Ambulatory referral to Physical Therapy   No orders of the defined types were placed in this encounter.   Imaging: XR Clavicle Right  Result Date: 08/20/2020 Stable fixation alignment of distal clavicle fracture. No hardware complications.   PMFS History: Patient Active Problem List   Diagnosis Date Noted  . AC separation, right, initial encounter   . Traumatic closed  fracture of distal clavicle with minimal displacement, right, initial encounter 06/11/2020  . Dyspareunia, female 04/24/2020  . Periumbilical abdominal pain 03/12/2020  . Diarrhea 03/12/2020  . Toe pain, chronic, left 02/28/2020  . GAD (generalized anxiety disorder) 01/02/2020  . Alcohol abuse 01/02/2020  . Hand injury, right, initial encounter 09/26/2019  . Palpitations 04/03/2019  . Seasonal allergies 01/16/2019  . Elevated LFTs 11/15/2018  . 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 Hyperlipidemia 07/08/2018  . Asthma 08/20/2017  . Preventative health care 06/21/2017  . Insomnia 04/16/2017  . Essential hypertension 04/16/2017   Past Medical History:  Diagnosis Date  . Acute right lower quadrant pain 06/10/2018  . Alcohol abuse   . Asthma   . Hypertension   . Insomnia   . Pain of upper abdomen 06/21/2017  . Palpitations    a. 05/2019 Echo: EF 60-65%, diast dysfxn; b. 05/2019 Zio: Sinus rhythm-sinus tach. Avg HR 105. 6 SVT episodes - longest 7 beats. Rare PVCs-->Diltiazem added.  Marland Kitchen Spongiotic dermatitis     Family History  Problem Relation Age of Onset  . Arthritis Mother   . Arthritis Father   . Lung cancer Father   . Heart attack Father   . Hyperlipidemia Maternal Grandmother   . Stroke Maternal Grandmother   . Hypertension Maternal Grandmother   . Heart attack Maternal Grandmother   . Alcohol abuse Paternal Grandmother   . Arthritis Paternal Grandmother   . Mental illness Paternal Grandmother   .  Breast cancer Maternal Aunt   . Epilepsy Brother     Past Surgical History:  Procedure Laterality Date  . CESAREAN SECTION    . FRACTURE SURGERY    . ORIF CLAVICULAR FRACTURE Right 06/17/2020   Procedure: OPEN REDUCTION INTERNAL FIXATION (ORIF) RIGHT CLAVICLE FRACTURE, ACROMIOCLAVICULAR SEPARATION;  Surgeon: Tarry Kos, MD;  Location: [facility];  Service: Orthopedics;  Laterality: Right;  . ORIF CLAVICULAR FRACTURE Right 07/05/2020   Procedure: REVISION OPEN  REDUCTION INTERNAL FIXATION (ORIF) RIGHT CLAVICLE FRACTURE, ACROMIOCLAVICULAR SEPARATION;  Surgeon: Tarry Kos, MD;  Location: [facility];  Service: Orthopedics;  Laterality: Right;  . ROBOTIC ASSISTED TOTAL HYSTERECTOMY N/A 03/05/2014   Procedure: ROBOTIC ASSISTED TOTAL HYSTERECTOMY/BILATERAL SALPINGECTOMY;  Surgeon: Serita Kyle, MD;  Location: [facility];  Service: Gynecology;  Laterality: N/A;  . TUBAL LIGATION     Social History   Occupational History  . Not on file  Tobacco Use  . Smoking status: Never Smoker  . Smokeless tobacco: Never Used  Vaping Use  . Vaping Use: Never used  Substance and Sexual Activity  . Alcohol use: Yes    Comment: occasion  . Drug use: No  . Sexual activity: Not on file

## 2020-08-28 ENCOUNTER — Other Ambulatory Visit: Payer: Self-pay | Admitting: Physician Assistant

## 2020-08-28 ENCOUNTER — Encounter: Payer: Self-pay | Admitting: Orthopaedic Surgery

## 2020-08-28 MED ORDER — HYDROCODONE-ACETAMINOPHEN 5-325 MG PO TABS
1.0000 | ORAL_TABLET | Freq: Every day | ORAL | 0 refills | Status: DC | PRN
Start: 2020-08-28 — End: 2020-09-17

## 2020-08-28 NOTE — Telephone Encounter (Signed)
Sent in.  Please use sparingly

## 2020-09-02 ENCOUNTER — Other Ambulatory Visit: Payer: Self-pay | Admitting: Primary Care

## 2020-09-02 DIAGNOSIS — J4520 Mild intermittent asthma, uncomplicated: Secondary | ICD-10-CM

## 2020-09-03 NOTE — Telephone Encounter (Signed)
LVM to schedule overdue fu °

## 2020-09-16 ENCOUNTER — Encounter: Payer: Self-pay | Admitting: Orthopaedic Surgery

## 2020-09-16 NOTE — Telephone Encounter (Signed)
Don't think I got it.  What is she requesting?

## 2020-09-17 ENCOUNTER — Other Ambulatory Visit: Payer: Self-pay | Admitting: Physician Assistant

## 2020-09-17 MED ORDER — HYDROCODONE-ACETAMINOPHEN 5-325 MG PO TABS
1.0000 | ORAL_TABLET | Freq: Every day | ORAL | 0 refills | Status: DC | PRN
Start: 2020-09-17 — End: 2020-09-27

## 2020-09-17 NOTE — Telephone Encounter (Signed)
Sent in.  Last narcotic we can refill

## 2020-09-23 ENCOUNTER — Telehealth: Payer: Self-pay | Admitting: Physician Assistant

## 2020-09-23 NOTE — Telephone Encounter (Signed)
  Patient Consent for Virtual Visit         Kelly Walton has provided verbal consent on 09/23/2020 for a virtual visit (video or telephone).   CONSENT FOR VIRTUAL VISIT FOR:  Kelly Walton  By participating in this virtual visit I agree to the following:  I hereby voluntarily request, consent and authorize [facility] and its employed or contracted physicians, physician assistants, nurse practitioners or other licensed health care professionals (the Practitioner), to provide me with telemedicine health care services (the "Services") as deemed necessary by the treating Practitioner. I acknowledge and consent to receive the Services by the Practitioner via telemedicine. I understand that the telemedicine visit will involve communicating with the Practitioner through live audiovisual communication technology and the disclosure of certain medical information by electronic transmission. I acknowledge that I have been given the opportunity to request an in-person assessment or other available alternative prior to the telemedicine visit and am voluntarily participating in the telemedicine visit.  I understand that I have the right to withhold or withdraw my consent to the use of telemedicine in the course of my care at any time, without affecting my right to future care or treatment, and that the Practitioner or I may terminate the telemedicine visit at any time. I understand that I have the right to inspect all information obtained and/or recorded in the course of the telemedicine visit and may receive copies of available information for a reasonable fee.  I understand that some of the potential risks of receiving the Services via telemedicine include:  Marland Kitchen Delay or interruption in medical evaluation due to technological equipment failure or disruption; . Information transmitted may not be sufficient (e.g. poor resolution of images) to allow for appropriate medical decision making by the Practitioner;  and/or  . In rare instances, security protocols could fail, causing a breach of personal health information.  Furthermore, I acknowledge that it is my responsibility to provide information about my medical history, conditions and care that is complete and accurate to the best of my ability. I acknowledge that Practitioner's advice, recommendations, and/or decision may be based on factors not within their control, such as incomplete or inaccurate data provided by me or distortions of diagnostic images or specimens that may result from electronic transmissions. I understand that the practice of medicine is not an exact science and that Practitioner makes no warranties or guarantees regarding treatment outcomes. I acknowledge that a copy of this consent can be made available to me via my patient portal [facility] MyChart), or I can request a printed copy by calling the office of [facility].    I understand that my insurance will be billed for this visit.   I have read or had this consent read to me. . I understand the contents of this consent, which adequately explains the benefits and risks of the Services being provided via telemedicine.  . I have been provided ample opportunity to ask questions regarding this consent and the Services and have had my questions answered to my satisfaction. . I give my informed consent for the services to be provided through the use of telemedicine in my medical care

## 2020-09-25 ENCOUNTER — Ambulatory Visit: Payer: No Typology Code available for payment source | Admitting: Physical Therapy

## 2020-09-27 ENCOUNTER — Encounter: Payer: Self-pay | Admitting: Physician Assistant

## 2020-09-27 ENCOUNTER — Telehealth (INDEPENDENT_AMBULATORY_CARE_PROVIDER_SITE_OTHER): Payer: No Typology Code available for payment source | Admitting: Physician Assistant

## 2020-09-27 ENCOUNTER — Other Ambulatory Visit: Payer: Self-pay

## 2020-09-27 VITALS — Ht 64.0 in | Wt 116.0 lb

## 2020-09-27 DIAGNOSIS — I10 Essential (primary) hypertension: Secondary | ICD-10-CM

## 2020-09-27 DIAGNOSIS — R002 Palpitations: Secondary | ICD-10-CM

## 2020-09-27 NOTE — Progress Notes (Signed)
Attempted to contact multiple times.  Unable to reach via virtual visit with video, due to poorly functioning technology.  Attempted to reach via telemedicine visit /phone only with messages left on voicemail.

## 2020-09-30 ENCOUNTER — Encounter: Payer: Self-pay | Admitting: Physician Assistant

## 2020-10-03 ENCOUNTER — Ambulatory Visit: Payer: No Typology Code available for payment source | Admitting: Rehabilitative and Restorative Service Providers"

## 2020-10-04 ENCOUNTER — Other Ambulatory Visit: Payer: Self-pay | Admitting: Physician Assistant

## 2020-10-04 ENCOUNTER — Encounter: Payer: Self-pay | Admitting: Orthopaedic Surgery

## 2020-10-04 MED ORDER — TRAMADOL HCL 50 MG PO TABS
50.0000 mg | ORAL_TABLET | Freq: Three times a day (TID) | ORAL | 0 refills | Status: DC | PRN
Start: 2020-10-04 — End: 2020-11-18

## 2020-10-04 NOTE — Telephone Encounter (Signed)
Per previous message, we cannot call in anymore narcotics.  I have sent in tramadol

## 2020-10-15 ENCOUNTER — Other Ambulatory Visit: Payer: Self-pay

## 2020-10-15 ENCOUNTER — Encounter: Payer: Self-pay | Admitting: Primary Care

## 2020-10-15 ENCOUNTER — Ambulatory Visit (INDEPENDENT_AMBULATORY_CARE_PROVIDER_SITE_OTHER): Payer: No Typology Code available for payment source | Admitting: Primary Care

## 2020-10-15 VITALS — BP 130/82 | HR 81 | Temp 97.6°F | Ht 64.0 in | Wt 125.0 lb

## 2020-10-15 DIAGNOSIS — H00015 Hordeolum externum left lower eyelid: Secondary | ICD-10-CM | POA: Diagnosis not present

## 2020-10-15 DIAGNOSIS — G44209 Tension-type headache, unspecified, not intractable: Secondary | ICD-10-CM

## 2020-10-15 MED ORDER — ERYTHROMYCIN 5 MG/GM OP OINT: 1.0000 "application " | TOPICAL_OINTMENT | Freq: Every day | OPHTHALMIC | 0 refills | Status: DC

## 2020-10-15 MED ORDER — KETOROLAC TROMETHAMINE 60 MG/2ML IM SOLN
60.0000 mg | Freq: Once | INTRAMUSCULAR | Status: AC
  Administered 2020-10-15: 60 mg via INTRAMUSCULAR

## 2020-10-15 NOTE — Addendum Note (Signed)
Addended by: Donnamarie Poag on: 10/15/2020 08:38 AM   Modules accepted: Orders

## 2020-10-15 NOTE — Progress Notes (Signed)
Subjective:    Patient ID: Kelly Walton, female    DOB: [birth year], 50 y.o.   MRN: [id number]  HPI   This visit occurred during the SARS-CoV-2 public health emergency.  Safety protocols were in place, including screening questions prior to the visit, additional usage of staff PPE, and extensive cleaning of exam room while observing appropriate contact time as indicated for disinfecting solutions.   [name] is a 50 year old female with a history of alcohol abuse, hypertension, clavicle fracture, Covid-19, GAD who presents today with a chief complaint of headaches and stye.  She sent a message via My Chart on 10/07/20 with reports of severe headaches since Covid-19 infection. She had tried OTC medications without improvement so she was asked to come in.  Today she endorses a headache to the bilateral temporal head for which she describes as a pressure. Her headache doesn't bother her with sleep or rest, but is noticeable with movement and activity. She's noticed sensitivity to light. She denies nausea.   She's tried Excedrin Migraine, Tension Headache with Aleve without improvement. She endorses a remote history of "migraines" during her late teenage years, never diagnosed or treated.   Her stye is located to the left lower eye lid which has been presents for 3 weeks. The actual stye has resolved but has left red irritation to the lower lid. She's been using "Stye" treatment OTC. Over the last week she's not noticed resolve. Her left eye will water, denies purulent drainage.    BP Readings from Last 3 Encounters:  10/15/20 130/82  07/05/20 135/81  06/17/20 (!) 147/92     Review of Systems  Constitutional: Negative for fever.  Eyes: Positive for photophobia.       Left lower lid irritation  Respiratory: Negative for cough.   Neurological: Positive for headaches.       Past Medical History:  Diagnosis Date  . Acute right lower quadrant pain 06/10/2018  . Alcohol abuse   .  Asthma   . Hypertension   . Insomnia   . Pain of upper abdomen 06/21/2017  . Palpitations    a. 05/2019 Echo: EF 60-65%, diast dysfxn; b. 05/2019 Zio: Sinus rhythm-sinus tach. Avg HR 105. 6 SVT episodes - longest 7 beats. Rare PVCs-->Diltiazem added.  Marland Kitchen Spongiotic dermatitis      Social History   Socioeconomic History  . Marital status: Single    Spouse name: Not on file  . Number of children: Not on file  . Years of education: Not on file  . Highest education level: Not on file  Occupational History  . Not on file  Tobacco Use  . Smoking status: Never Smoker  . Smokeless tobacco: Never Used  Vaping Use  . Vaping Use: Never used  Substance and Sexual Activity  . Alcohol use: Yes    Comment: occasion  . Drug use: No  . Sexual activity: Not on file  Other Topics Concern  . Not on file  Social History Narrative   Single.   2 children, 1 step-son.   Manager at [employer].   Enjoys going to [employer], spending time with family.    Social Determinants of Health   Financial Resource Strain: Not on file  Food Insecurity: Not on file  Transportation Needs: Not on file  Physical Activity: Not on file  Stress: Not on file  Social Connections: Not on file  Intimate Partner Violence: Not on file    Past Surgical History:  Procedure Laterality  Date  . CESAREAN SECTION    . FRACTURE SURGERY    . ORIF CLAVICULAR FRACTURE Right 06/17/2020   Procedure: OPEN REDUCTION INTERNAL FIXATION (ORIF) RIGHT CLAVICLE FRACTURE, ACROMIOCLAVICULAR SEPARATION;  Surgeon: Tarry Kos, MD;  Location: [facility];  Service: Orthopedics;  Laterality: Right;  . ORIF CLAVICULAR FRACTURE Right 07/05/2020   Procedure: REVISION OPEN REDUCTION INTERNAL FIXATION (ORIF) RIGHT CLAVICLE FRACTURE, ACROMIOCLAVICULAR SEPARATION;  Surgeon: Tarry Kos, MD;  Location: [facility];  Service: Orthopedics;  Laterality: Right;  . ROBOTIC ASSISTED TOTAL HYSTERECTOMY N/A 03/05/2014   Procedure: ROBOTIC ASSISTED TOTAL  HYSTERECTOMY/BILATERAL SALPINGECTOMY;  Surgeon: Serita Kyle, MD;  Location: [facility];  Service: Gynecology;  Laterality: N/A;  . TUBAL LIGATION      Family History  Problem Relation Age of Onset  . Arthritis Mother   . Arthritis Father   . Lung cancer Father   . Heart attack Father   . Hyperlipidemia Maternal Grandmother   . Stroke Maternal Grandmother   . Hypertension Maternal Grandmother   . Heart attack Maternal Grandmother   . Alcohol abuse Paternal Grandmother   . Arthritis Paternal Grandmother   . Mental illness Paternal Grandmother   . Breast cancer Maternal Aunt   . Epilepsy Brother     Allergies  Allergen Reactions  . Promethazine Hcl Hives and Rash  . Sulfa Antibiotics Hives and Rash    Pt states sent her to the hospital.  . Trazodone And Nefazodone     Hallucinations     Current Outpatient Medications on File Prior to Visit  Medication Sig Dispense Refill  . albuterol (VENTOLIN HFA) 108 (90 Base) MCG/ACT inhaler TAKE 2 PUFFS BY MOUTH EVERY 6 HOURS AS NEEDED FOR WHEEZE OR SHORTNESS OF BREATH 6.7 each 0  . calcium-vitamin D (OSCAL WITH D) 500-200 MG-UNIT tablet Take 1 tablet by mouth 3 (three) times daily. 90 tablet 6  . fluticasone (FLOVENT HFA) 110 MCG/ACT inhaler Inhale 1 puff into the lungs in the morning and at bedtime. Rinse mouth after each use. 1 each 2  . hydrOXYzine (ATARAX/VISTARIL) 10 MG tablet Take 1 tablet (10 mg total) by mouth at bedtime as needed for anxiety. 90 tablet 0  . montelukast (SINGULAIR) 10 MG tablet TAKE 1 TABLET (10 MG TOTAL) BY MOUTH AT BEDTIME. FOR ALLERGIES/ASTHMA. 90 tablet 0  . sertraline (ZOLOFT) 50 MG tablet Take 1 tablet (50 mg total) by mouth at bedtime. For anxiety. 90 tablet 1  . TIADYLT ER 180 MG 24 hr capsule Take 180 mg by mouth at bedtime.    . traMADol (ULTRAM) 50 MG tablet Take 1 tablet (50 mg total) by mouth 3 (three) times daily as needed. 30 tablet 0   No current facility-administered medications on file prior  to visit.    BP 130/82   Pulse 81   Temp 97.6 F (36.4 C) (Temporal)   Ht 5\' 4"  (1.626 m)   Wt 125 lb (56.7 kg)   LMP 03/05/2014 Comment: LMP x 68yrs ago   SpO2 98%   BMI 21.46 kg/m    Objective:   Physical Exam Constitutional:      Appearance: She is well-nourished.  Eyes:     General:        Left eye: No discharge.      Comments: Erythema noted to place of prior style location. Mild swelling. No drainage.   Cardiovascular:     Rate and Rhythm: Normal rate and regular rhythm.  Pulmonary:  Effort: Pulmonary effort is normal.     Breath sounds: Normal breath sounds.  Musculoskeletal:     Cervical back: Neck supple.  Skin:    General: Skin is warm and dry.  Psychiatric:        Mood and Affect: Mood and affect normal.            Assessment & Plan:

## 2020-10-15 NOTE — Patient Instructions (Signed)
Apply the erythromycin ointment at bedtime for the stye.  Please notify me if your headache does not improve and/or returns.   It was a pleasure to see you today!

## 2020-10-15 NOTE — Assessment & Plan Note (Signed)
Acute and intermittent since Covid-19 x 3 weeks.  No improvement with OTC treatment.  IM Toradol 60 mg provided today. She will update if symptoms do not improve/resolve. In that case we will send in Imitrex 50 mg.

## 2020-10-16 ENCOUNTER — Encounter: Payer: Self-pay | Admitting: Rehabilitative and Restorative Service Providers"

## 2020-10-16 ENCOUNTER — Ambulatory Visit (INDEPENDENT_AMBULATORY_CARE_PROVIDER_SITE_OTHER): Payer: No Typology Code available for payment source | Admitting: Rehabilitative and Restorative Service Providers"

## 2020-10-16 ENCOUNTER — Telehealth: Payer: Self-pay

## 2020-10-16 DIAGNOSIS — R293 Abnormal posture: Secondary | ICD-10-CM

## 2020-10-16 DIAGNOSIS — G44209 Tension-type headache, unspecified, not intractable: Secondary | ICD-10-CM

## 2020-10-16 DIAGNOSIS — M25511 Pain in right shoulder: Secondary | ICD-10-CM

## 2020-10-16 DIAGNOSIS — G8929 Other chronic pain: Secondary | ICD-10-CM | POA: Diagnosis not present

## 2020-10-16 DIAGNOSIS — M25611 Stiffness of right shoulder, not elsewhere classified: Secondary | ICD-10-CM

## 2020-10-16 MED ORDER — SUMATRIPTAN SUCCINATE 50 MG PO TABS: ORAL_TABLET | ORAL | 0 refills | Status: DC

## 2020-10-16 NOTE — Patient Instructions (Signed)
Access Code: [email or web address] URL: [email or web address] Date: 10/16/2020 Prepared by: Pauletta Browns  Exercises Supine Scapular Protraction in Flexion with Dumbbells - 2-3 x daily - 7 x weekly - 1 sets - 20 reps - 3 seconds hold Supine Shoulder Internal Rotation Stretch - 2-3 x daily - 7 x weekly - 1 sets - 10-20 reps - 10 secondsinutes hold Supine Shoulder External Rotation Stretch - 2-3 x daily - 7 x weekly - 1 sets - 10-20 reps - 10 seconds hold Supine Shoulder Flexion AAROM with Hands Clasped - 2-3 x daily - 7 x weekly - 1 sets - 10-20 reps - 10 seconds hold Standing Shoulder Posterior Capsule Stretch - 2-3 x daily - 7 x weekly - 1 sets - 10 reps - 10 seconds hold Standing Shoulder Internal Rotation Stretch with Hands Behind Back - 2-3 x daily - 7 x weekly - 1 sets - 10 reps - 10 seconds hold

## 2020-10-16 NOTE — Telephone Encounter (Signed)
Pt left v/m that she was seen on 10/15/20 and was advised to mychart Kelly Reel NP if injection(toradol) given helped h/a. Pt said she cannot my chart due to not having phone. Pt said the injection helped on 10/15/20 but the H/A is back today and wants med(per office note Imitrex) sent to [facility] [address] that was discussed on 10/15/20.

## 2020-10-16 NOTE — Telephone Encounter (Signed)
Please notify patient that I'm sending in a prescription for sumatriptan 50 mg. Take 1 tablet by mouth at migraine onset. May repeat with second tablet 2 hours later if no resolve. Please have her update me.

## 2020-10-16 NOTE — Therapy (Signed)
[facility] Physical Therapy [address] [place], [place], [address] Phone: [phone]   Fax:  [phone]  Physical Therapy Evaluation  Patient Details  Name: Kelly Walton MRN: [id number] Date of Birth: 05-Jan-1971 Referring Provider (PT): Tarry Kos MD   Encounter Date: 10/16/2020   PT End of Session - 10/16/20 1357    Visit Number 1    Number of Visits 16    PT Start Time 1301    PT Stop Time 1340    PT Time Calculation (min) 39 min    Activity Tolerance Patient limited by pain;Patient tolerated treatment well    Behavior During Therapy [facility] for tasks assessed/performed           Past Medical History:  Diagnosis Date  . Acute right lower quadrant pain 06/10/2018  . Alcohol abuse   . Asthma   . Hypertension   . Insomnia   . Pain of upper abdomen 06/21/2017  . Palpitations    a. 05/2019 Echo: EF 60-65%, diast dysfxn; b. 05/2019 Zio: Sinus rhythm-sinus tach. Avg HR 105. 6 SVT episodes - longest 7 beats. Rare PVCs-->Diltiazem added.  Kelly Walton Kitchen Spongiotic dermatitis     Past Surgical History:  Procedure Laterality Date  . CESAREAN SECTION    . FRACTURE SURGERY    . ORIF CLAVICULAR FRACTURE Right 06/17/2020   Procedure: OPEN REDUCTION INTERNAL FIXATION (ORIF) RIGHT CLAVICLE FRACTURE, ACROMIOCLAVICULAR SEPARATION;  Surgeon: Tarry Kos, MD;  Location: [facility];  Service: Orthopedics;  Laterality: Right;  . ORIF CLAVICULAR FRACTURE Right 07/05/2020   Procedure: REVISION OPEN REDUCTION INTERNAL FIXATION (ORIF) RIGHT CLAVICLE FRACTURE, ACROMIOCLAVICULAR SEPARATION;  Surgeon: Tarry Kos, MD;  Location: [facility];  Service: Orthopedics;  Laterality: Right;  . ROBOTIC ASSISTED TOTAL HYSTERECTOMY N/A 03/05/2014   Procedure: ROBOTIC ASSISTED TOTAL HYSTERECTOMY/BILATERAL SALPINGECTOMY;  Surgeon: Serita Kyle, MD;  Location: [facility];  Service: Gynecology;  Laterality: N/A;  . TUBAL LIGATION      There were no vitals filed for this visit.     Subjective Assessment - 10/16/20 1348    Subjective Kelly Walton tripped over her dog in October and broke her R clavicle. Her initial surgery was 06/17/2020 with revision 07/05/2020.  Due to getting Covid, her rehabilitation has been delayed.    Pertinent History HTN    Limitations Other (comment)   Reaching and overhead function   Patient Stated Goals Get normal shoulder AROM back for return to normal ADLs    Currently in Pain? Yes    Pain Score 3     Pain Location Shoulder    Pain Orientation Right    Pain Descriptors / Indicators Tightness    Pain Type Chronic pain;Surgical pain    Pain Onset More than a month ago    Pain Frequency Intermittent    Aggravating Factors  R UE use    Pain Relieving Factors Tramedol and ice    Effect of Pain on Daily Activities Limited AROM of R shoulder    Multiple Pain Sites No              [facility] PT Assessment - 10/16/20 0001      Assessment   Medical Diagnosis s/p ORIF R clavicle    Referring Provider (PT) Coralyn Mark Donnelly Stager MD    Onset Date/Surgical Date 07/05/20   06/17/2020 Revision   Hand Dominance Right      Balance Screen   Has the patient fallen in the past 6 months Yes  How many times? 1    Has the patient had a decrease in activity level because of a fear of falling?  No    Is the patient reluctant to leave their home because of a fear of falling?  No      Prior Function   Level of Independence Independent      Cognition   Overall Cognitive Status Within Functional Limits for tasks assessed      Observation/Other Assessments   Focus on Therapeutic Outcomes (FOTO)  50 (Goal 64)      ROM / Strength   AROM / PROM / Strength AROM      AROM   Overall AROM  Deficits    AROM Assessment Site Shoulder    Right/Left Shoulder Left;Right    Right Shoulder Flexion 105 Degrees    Right Shoulder Internal Rotation 20 Degrees    Right Shoulder External Rotation 40 Degrees    Right Shoulder Horizontal  ADduction 20 Degrees    Left Shoulder  Flexion 170 Degrees    Left Shoulder Internal Rotation 65 Degrees    Left Shoulder External Rotation 95 Degrees    Left Shoulder Horizontal ADduction 40 Degrees                      Objective measurements completed on examination: See above findings.       [facility] Adult PT Treatment/Exercise - 10/16/20 0001      Exercises   Exercises Shoulder      Shoulder Exercises: Supine   Protraction Strengthening;Right;20 reps    External Rotation AROM;Right;10 reps;Other (comment)    External Rotation Limitations 10 seconds    Internal Rotation AROM;Right;10 reps;Other (comment)    Internal Rotation Limitations 10 seconds    Flexion AROM;Right;10 reps;Other (comment)    Flexion Limitations 10 seconds      Shoulder Exercises: Standing   Internal Rotation AAROM;Right;10 reps;Other (comment)    Internal Rotation Limitations 10 seconds thumb up the back stretch    Other Standing Exercises Posterior capsule stretch (horizontal adduction) 10X 10 seconds                  PT Education - 10/16/20 1352    Education Details Reviewed exam findings and starter HEP.  Reinforced importance of 2-3X/Day HEP compliance given her nearly 3 months post surgery status.    Person(s) Educated Patient    Methods Explanation;Demonstration;Tactile cues;Verbal cues;Handout    Comprehension Verbal cues required;Need further instruction;Returned demonstration;Verbalized understanding;Tactile cues required               PT Long Term Goals - 10/16/20 1402      PT LONG TERM GOAL #1   Title Improve FOTO score to 64.    Baseline 50    Time 8    Period Weeks    Status New    Target Date 12/20/20      PT LONG TERM GOAL #2   Title Decrease R shoulder pain to 0-2/10 on the Numeric Pain Rating Scale.    Baseline Can be 5-7/10 at end range.    Time 8    Period Weeks    Status New    Target Date 12/20/20      PT LONG TERM GOAL #3   Title Improve R shoulder AROM to 90% or better as  compared to the uninvolved L.    Baseline 50%    Time 8    Period Weeks    Status New  Target Date 12/20/20      PT LONG TERM GOAL #4   Title Kelly Walton will be independent with her long-term HEP at DC.    Time 8    Period Weeks    Status New    Target Date 12/20/20                  Plan - 10/16/20 1335    Clinical Impression Statement Kelly Walton has a very stiff R shoulder post ORIF 2X in October 2021 (original surgery and revision).  She is getting a late  start with her physical therapy due to getting Covid earlier this year.  Capsular stretching and flexibility will be the emphasis with heavy emphasis on HEP due to her work schedule.    Examination-Activity Limitations Bathing;Dressing;Carry;Reach Overhead    Examination-Participation Restrictions Occupation;Community Activity;Driving    Stability/Clinical Decision Making Stable/Uncomplicated    Clinical Decision Making Low    Rehab Potential Good    PT Frequency 2x / week    PT Duration 8 weeks    PT Treatment/Interventions ADLs/Self Care Home Management;Moist Heat;Cryotherapy;Therapeutic activities;Therapeutic exercise;Neuromuscular re-education;Patient/family education;Manual techniques;Passive range of motion;Vasopneumatic Device;Joint Manipulations    PT Next Visit Plan Capsular stretching for AROM progress    PT Home Exercise Plan Access Code: [email or web address]  URL: [email or web address]  Date: 10/16/2020  Prepared by: Pauletta Browns    Exercises  Supine Scapular Protraction in Flexion with Dumbbells - 2-3 x daily - 7 x weekly - 1 sets - 20 reps - 3 seconds hold  Supine Shoulder Internal Rotation Stretch - 2-3 x daily - 7 x weekly - 1 sets - 10-20 reps - 10 secondsinutes hold  Supine Shoulder External Rotation Stretch - 2-3 x daily - 7 x weekly - 1 sets - 10-20 reps - 10 seconds hold  Supine Shoulder Flexion AAROM with Hands Clasped - 2-3 x daily - 7 x weekly - 1 sets - 10-20 reps - 10 seconds hold  Standing Shoulder  Posterior Capsule Stretch - 2-3 x daily - 7 x weekly - 1 sets - 10 reps - 10 seconds hold  Standing Shoulder Internal Rotation Stretch with Hands Behind Back - 2-3 x daily - 7 x weekly - 1 sets - 10 reps - 10 seconds hold    Consulted and Agree with Plan of Care Patient           Patient will benefit from skilled therapeutic intervention in order to improve the following deficits and impairments:  Decreased range of motion,Decreased mobility,Hypomobility,Impaired flexibility,Impaired UE functional use,Pain,Increased edema  Visit Diagnosis: Abnormal posture  Stiffness of right shoulder, not elsewhere classified  Chronic right shoulder pain     Problem List Patient Active Problem List   Diagnosis Date Noted  . Acute tension headache 10/15/2020  . AC separation, right, initial encounter   . Traumatic closed fracture of distal clavicle with minimal displacement, right, initial encounter 06/11/2020  . Dyspareunia, female 04/24/2020  . Periumbilical abdominal pain 03/12/2020  . Diarrhea 03/12/2020  . Toe pain, chronic, left 02/28/2020  . GAD (generalized anxiety disorder) 01/02/2020  . Alcohol abuse 01/02/2020  . Hand injury, right, initial encounter 09/26/2019  . Palpitations 04/03/2019  . Seasonal allergies 01/16/2019  . Elevated LFTs 11/15/2018  . 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 Hyperlipidemia 07/08/2018  . Asthma 08/20/2017  . Preventative health care 06/21/2017  . Insomnia 04/16/2017  . Essential hypertension 04/16/2017    Cherlyn Cushing PT, MPT 10/16/2020, 2:06 PM  [facility] Physical Therapy [phone]  [address] [place], [place], [address] Phone: [phone]   Fax:  [phone]  Name: Kelly Walton MRN: [id number] Date of Birth: [birth year]

## 2020-10-17 NOTE — Telephone Encounter (Signed)
Left message to return call to our office.  

## 2020-10-18 NOTE — Telephone Encounter (Signed)
Left message to return call to our office.  

## 2020-10-22 ENCOUNTER — Other Ambulatory Visit: Payer: Self-pay

## 2020-10-22 ENCOUNTER — Ambulatory Visit (INDEPENDENT_AMBULATORY_CARE_PROVIDER_SITE_OTHER): Payer: No Typology Code available for payment source | Admitting: Physical Therapy

## 2020-10-22 DIAGNOSIS — M25511 Pain in right shoulder: Secondary | ICD-10-CM

## 2020-10-22 DIAGNOSIS — M25611 Stiffness of right shoulder, not elsewhere classified: Secondary | ICD-10-CM

## 2020-10-22 DIAGNOSIS — R293 Abnormal posture: Secondary | ICD-10-CM

## 2020-10-22 DIAGNOSIS — G8929 Other chronic pain: Secondary | ICD-10-CM | POA: Diagnosis not present

## 2020-10-22 NOTE — Therapy (Signed)
[facility] Physical Therapy [address] [place], [place], [address] Phone: [phone]   Fax:  [phone]  Physical Therapy Treatment  Patient Details  Name: Kelly Walton MRN: [id number] Date of Birth: [birth year] Referring Provider (PT): Tarry Kos MD   Encounter Date: 10/22/2020   PT End of Session - 10/22/20 1155    Visit Number 2    Number of Visits 16    Date for PT Re-Evaluation 12/20/20    PT Start Time 1148    PT Stop Time 1232    PT Time Calculation (min) 44 min    Activity Tolerance Patient limited by pain;Patient tolerated treatment well    Behavior During Therapy [facility] for tasks assessed/performed           Past Medical History:  Diagnosis Date  . Acute right lower quadrant pain 06/10/2018  . Alcohol abuse   . Asthma   . Hypertension   . Insomnia   . Pain of upper abdomen 06/21/2017  . Palpitations    a. 05/2019 Echo: EF 60-65%, diast dysfxn; b. 05/2019 Zio: Sinus rhythm-sinus tach. Avg HR 105. 6 SVT episodes - longest 7 beats. Rare PVCs-->Diltiazem added.  Marland Kitchen Spongiotic dermatitis     Past Surgical History:  Procedure Laterality Date  . CESAREAN SECTION    . FRACTURE SURGERY    . ORIF CLAVICULAR FRACTURE Right 06/17/2020   Procedure: OPEN REDUCTION INTERNAL FIXATION (ORIF) RIGHT CLAVICLE FRACTURE, ACROMIOCLAVICULAR SEPARATION;  Surgeon: Tarry Kos, MD;  Location: [facility];  Service: Orthopedics;  Laterality: Right;  . ORIF CLAVICULAR FRACTURE Right 07/05/2020   Procedure: REVISION OPEN REDUCTION INTERNAL FIXATION (ORIF) RIGHT CLAVICLE FRACTURE, ACROMIOCLAVICULAR SEPARATION;  Surgeon: Tarry Kos, MD;  Location: [facility];  Service: Orthopedics;  Laterality: Right;  . ROBOTIC ASSISTED TOTAL HYSTERECTOMY N/A 03/05/2014   Procedure: ROBOTIC ASSISTED TOTAL HYSTERECTOMY/BILATERAL SALPINGECTOMY;  Surgeon: Serita Kyle, MD;  Location: [facility];  Service: Gynecology;  Laterality: N/A;  . TUBAL LIGATION      There  were no vitals filed for this visit.   Subjective Assessment - 10/22/20 1152    Subjective Pt arriving today reporting 4-5/10 pain in R shoulder. Pt reporting she is still not able to sleep on her right side.    Pertinent History Her initial surgery was 06/17/2020 with revision 07/05/2020.    Limitations Other (comment)    Patient Stated Goals Get normal shoulder AROM back for return to normal ADLs    Currently in Pain? Yes    Pain Score 5     Pain Location Shoulder    Pain Orientation Right    Pain Descriptors / Indicators Tightness;Sore    Pain Type Chronic pain    Pain Onset More than a month ago    Pain Frequency Intermittent                             [facility] Adult PT Treatment/Exercise - 10/22/20 0001      Exercises   Exercises Shoulder      Shoulder Exercises: Supine   Protraction Strengthening;Right;20 reps    External Rotation AROM;Right;10 reps;Other (comment)    Flexion AROM;Right;Other (comment);15 reps    Flexion Limitations 5 seconds    Other Supine Exercises shoulder serratus punches x 15 using 2# bar      Shoulder Exercises: Seated   Row Strengthening;Both;15 reps;Theraband    Theraband Level (Shoulder Row) Level 2 (Red)  External Rotation Strengthening;Right;15 reps;Theraband    Theraband Level (Shoulder External Rotation) Level 1 (Yellow)      Shoulder Exercises: Standing   External Rotation Strengthening;15 reps;Theraband    Theraband Level (Shoulder External Rotation) Level 1 (Yellow)    Row AROM;Strengthening;15 reps    Theraband Level (Shoulder Row) Level 2 (Red)      Shoulder Exercises: Stretch   Corner Stretch 3 reps;20 seconds    Corner Stretch Limitations elbow at 60 degrees, and elbows at 90 degrees each x 2    Other Shoulder Stretches wall circles both directions x 30 reps    Other Shoulder Stretches shoulder adduction across chest stretch x 3 holding 20 seconds      Modalities   Modalities Cryotherapy      Cryotherapy    Number Minutes Cryotherapy 5 Minutes    Cryotherapy Location Shoulder    Type of Cryotherapy Ice pack      Manual Therapy   Manual therapy comments scapular mobs, Grade 2-3 joint mobs                       PT Long Term Goals - 10/22/20 1210      PT LONG TERM GOAL #1   Title Improve FOTO score to 64.    Status On-going      PT LONG TERM GOAL #2   Title Decrease R shoulder pain to 0-2/10 on the Numeric Pain Rating Scale.    Status On-going      PT LONG TERM GOAL #3   Title Improve R shoulder AROM to 90% or better as compared to the uninvolved L.    Status On-going      PT LONG TERM GOAL #4   Title Kymari will be independent with her long-term HEP at DC.    Status On-going                 Plan - 10/22/20 1200    Clinical Impression Statement Pt arirving to therapy reporting 4-5/10 pain in her R shoulder. Pt tolerating exercises well with shoulder ROM and strengthening. Pt reporting compliance with her HEP. Continue skilled PT.    Comorbidities ORIF clavical repair x 2 in October 2021 and Novemeber 2021    Examination-Activity Limitations Bathing;Dressing;Carry;Reach Overhead    Examination-Participation Restrictions Occupation;Community Activity;Driving    Stability/Clinical Decision Making Stable/Uncomplicated    Rehab Potential Good    PT Frequency 2x / week    PT Duration 8 weeks    PT Treatment/Interventions ADLs/Self Care Home Management;Moist Heat;Cryotherapy;Therapeutic activities;Therapeutic exercise;Neuromuscular re-education;Patient/family education;Manual techniques;Passive range of motion;Vasopneumatic Device;Joint Manipulations    PT Next Visit Plan Capsular stretching for AROM progress, strengthening,    PT Home Exercise Plan Access Code: [email or web address]  URL: [email or web address]  Date: 10/16/2020  Prepared by: Pauletta Browns    Exercises  Supine Scapular Protraction in Flexion with Dumbbells - 2-3 x daily - 7 x weekly - 1 sets - 20  reps - 3 seconds hold  Supine Shoulder Internal Rotation Stretch - 2-3 x daily - 7 x weekly - 1 sets - 10-20 reps - 10 secondsinutes hold  Supine Shoulder External Rotation Stretch - 2-3 x daily - 7 x weekly - 1 sets - 10-20 reps - 10 seconds hold  Supine Shoulder Flexion AAROM with Hands Clasped - 2-3 x daily - 7 x weekly - 1 sets - 10-20 reps - 10 seconds hold  Standing Shoulder Posterior Capsule Stretch - 2-3 x daily - 7 x  weekly - 1 sets - 10 reps - 10 seconds hold  Standing Shoulder Internal Rotation Stretch with Hands Behind Back - 2-3 x daily - 7 x weekly - 1 sets - 10 reps - 10 seconds hold    Consulted and Agree with Plan of Care Patient           Patient will benefit from skilled therapeutic intervention in order to improve the following deficits and impairments:  Decreased range of motion,Decreased mobility,Hypomobility,Impaired flexibility,Impaired UE functional use,Pain,Increased edema  Visit Diagnosis: Abnormal posture  Stiffness of right shoulder, not elsewhere classified  Chronic right shoulder pain     Problem List Patient Active Problem List   Diagnosis Date Noted  . Acute tension headache 10/15/2020  . AC separation, right, initial encounter   . Traumatic closed fracture of distal clavicle with minimal displacement, right, initial encounter 06/11/2020  . Dyspareunia, female 04/24/2020  . Periumbilical abdominal pain 03/12/2020  . Diarrhea 03/12/2020  . Toe pain, chronic, left 02/28/2020  . GAD (generalized anxiety disorder) 01/02/2020  . Alcohol abuse 01/02/2020  . Hand injury, right, initial encounter 09/26/2019  . Palpitations 04/03/2019  . Seasonal allergies 01/16/2019  . Elevated LFTs 11/15/2018  . 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 Hyperlipidemia 07/08/2018  . Asthma 08/20/2017  . Preventative health care 06/21/2017  . Insomnia 04/16/2017  . Essential hypertension 04/16/2017    Sharmon Leyden, PT, MPT 10/22/2020, 1:08 PM  [facility]  Physical Therapy [address] [place], [place], [address] Phone: [phone]   Fax:  [phone]  Name: MAGEN SURIANO MRN: [id number] Date of Birth: [birth year]

## 2020-10-22 NOTE — Telephone Encounter (Signed)
Have called x 3 l/m to call office. Have not been able to reach. Please advise.

## 2020-10-22 NOTE — Telephone Encounter (Signed)
Please use My Chart.

## 2020-10-23 NOTE — Telephone Encounter (Signed)
Have sent my chart message to patient with information.

## 2020-10-26 ENCOUNTER — Other Ambulatory Visit: Payer: Self-pay | Admitting: Primary Care

## 2020-10-26 DIAGNOSIS — F411 Generalized anxiety disorder: Secondary | ICD-10-CM

## 2020-10-29 ENCOUNTER — Encounter: Payer: No Typology Code available for payment source | Admitting: Rehabilitative and Restorative Service Providers"

## 2020-10-29 ENCOUNTER — Other Ambulatory Visit: Payer: Self-pay

## 2020-11-06 ENCOUNTER — Ambulatory Visit (INDEPENDENT_AMBULATORY_CARE_PROVIDER_SITE_OTHER): Payer: No Typology Code available for payment source | Admitting: Rehabilitative and Restorative Service Providers"

## 2020-11-06 ENCOUNTER — Encounter: Payer: Self-pay | Admitting: Rehabilitative and Restorative Service Providers"

## 2020-11-06 ENCOUNTER — Other Ambulatory Visit: Payer: Self-pay

## 2020-11-06 DIAGNOSIS — M25611 Stiffness of right shoulder, not elsewhere classified: Secondary | ICD-10-CM

## 2020-11-06 DIAGNOSIS — R293 Abnormal posture: Secondary | ICD-10-CM | POA: Diagnosis not present

## 2020-11-06 DIAGNOSIS — M25511 Pain in right shoulder: Secondary | ICD-10-CM | POA: Diagnosis not present

## 2020-11-06 DIAGNOSIS — G8929 Other chronic pain: Secondary | ICD-10-CM

## 2020-11-06 NOTE — Therapy (Signed)
[facility] Physical Therapy [address] [place], [place], [address] Phone: [phone]   Fax:  [phone]  Physical Therapy Treatment/Discharge  Patient Details  Name: BORA BOST MRN: [id number] Date of Birth: 08-31-1971 Referring Provider (PT): Leandrew Koyanagi MD  PHYSICAL THERAPY DISCHARGE SUMMARY  Visits from Start of Care: 3  Current functional level related to goals / functional outcomes: See note   Remaining deficits: Minimal strength deficits   Education / Equipment: Updated HEP  Plan: Patient agrees to discharge.  Patient goals were met. Patient is being discharged due to being pleased with the current functional level.  ?????     Encounter Date: 11/06/2020   PT End of Session - 11/06/20 1736    Visit Number 3    Number of Visits 16    Date for PT Re-Evaluation 12/20/20    PT Start Time 4944    PT Stop Time 1430    PT Time Calculation (min) 42 min    Activity Tolerance Patient tolerated treatment well;No increased pain    Behavior During Therapy WFL for tasks assessed/performed           Past Medical History:  Diagnosis Date  . Acute right lower quadrant pain 06/10/2018  . Alcohol abuse   . Asthma   . Hypertension   . Insomnia   . Pain of upper abdomen 06/21/2017  . Palpitations    a. 05/2019 Echo: EF 60-65%, diast dysfxn; b. 05/2019 Zio: Sinus rhythm-sinus tach. Avg HR 105. 6 SVT episodes - longest 7 beats. Rare PVCs-->Diltiazem added.  Marland Kitchen Spongiotic dermatitis     Past Surgical History:  Procedure Laterality Date  . CESAREAN SECTION    . FRACTURE SURGERY    . ORIF CLAVICULAR FRACTURE Right 06/17/2020   Procedure: OPEN REDUCTION INTERNAL FIXATION (ORIF) RIGHT CLAVICLE FRACTURE, ACROMIOCLAVICULAR SEPARATION;  Surgeon: Leandrew Koyanagi, MD;  Location: [place];  Service: Orthopedics;  Laterality: Right;  . ORIF CLAVICULAR FRACTURE Right 07/05/2020   Procedure: REVISION OPEN REDUCTION INTERNAL FIXATION (ORIF) RIGHT  CLAVICLE FRACTURE, ACROMIOCLAVICULAR SEPARATION;  Surgeon: Leandrew Koyanagi, MD;  Location: [place];  Service: Orthopedics;  Laterality: Right;  . ROBOTIC ASSISTED TOTAL HYSTERECTOMY N/A 03/05/2014   Procedure: ROBOTIC ASSISTED TOTAL HYSTERECTOMY/BILATERAL SALPINGECTOMY;  Surgeon: Marvene Staff, MD;  Location: [facility];  Service: Gynecology;  Laterality: N/A;  . TUBAL LIGATION      There were no vitals filed for this visit.   Subjective Assessment - 11/06/20 1734    Subjective Tiffeny reports everything is improving.  AROM is not limited and strength is making progress.  She has some discomfort sleeping on her R side, although this is also much better than at evaluation.    Pertinent History Her initial surgery was 06/17/2020 with revision 07/05/2020.    Limitations Other (comment)    Patient Stated Goals Get normal shoulder AROM back for return to normal ADLs    Currently in Pain? No/denies    Pain Onset More than a month ago              [facility] PT Assessment - 11/06/20 0001      AROM   AROM Assessment Site Shoulder    Right/Left Shoulder Right    Right Shoulder Flexion 170 Degrees    Right Shoulder Internal Rotation 90 Degrees    Right Shoulder External Rotation 65 Degrees    Right Shoulder Horizontal  ADduction 45 Degrees  [facility] Adult PT Treatment/Exercise - 11/06/20 0001      Exercises   Exercises Shoulder      Shoulder Exercises: Supine   Protraction Strengthening;Right;20 reps    External Rotation AROM;Right;10 reps;Other (comment)    External Rotation Limitations 10 seconds    Internal Rotation AROM;Right;10 reps;Other (comment)    Internal Rotation Limitations 10 seconds    Flexion AROM;Right;10 reps;Other (comment)    Flexion Limitations 10 seconds      Shoulder Exercises: Seated   Row Strengthening;Both;10 reps;Theraband    Theraband Level (Shoulder Row) Level 4 (Blue)    External Rotation Strengthening;Right;10  reps;Theraband;Other (comment)   2 sets   Theraband Level (Shoulder External Rotation) Level 2 (Red)    Internal Rotation Strengthening;Right;10 reps;Theraband    Theraband Level (Shoulder Internal Rotation) Level 2 (Red)      Shoulder Exercises: Standing   Internal Rotation AAROM;Right;10 reps;Other (comment)    Internal Rotation Limitations 10 seconds thumb up the back stretch    Other Standing Exercises Posterior capsule stretch (horizontal adduction) 10X 10 seconds                  PT Education - 11/06/20 1735    Education Details Reviewed exam findings and the importance of continued 3X/week home exercise compliance with emphasis on strength.    Person(s) Educated Patient    Methods Explanation;Demonstration;Verbal cues;Handout    Comprehension Returned demonstration;Verbal cues required;Verbalized understanding               PT Long Term Goals - 11/06/20 1733      PT LONG TERM GOAL #1   Title Improve FOTO score to 64.    Baseline 50 (85 today)    Status Achieved      PT LONG TERM GOAL #2   Title Decrease R shoulder pain to 0-2/10 on the Numeric Pain Rating Scale.    Baseline 0/10 unless overuse then can be 3/10    Status Partially Met      PT LONG TERM GOAL #3   Title Improve R shoulder AROM to 90% or better as compared to the uninvolved L.    Baseline 100% (was 50%)    Status Achieved      PT LONG TERM GOAL #4   Title Willine will be independent with her long-term HEP at [place].    Status Achieved                 Plan - 11/06/20 1737    Clinical Impression Statement [place] reports good HEP compliance and significant progress since starting PT.  She still has some discomfort sleeping on the R side and some 3/10 pain with overuse (she is building a house and working full-time).  Most activities are not painful and she expects pain with overuse.  Keandrea reports she is happy with her AROM progress (100%, was 50% at evaluation) and that strength is  progressing.  She would like to transition into more independent rehabilitation given her success and the fact that her home building takes most of her free time.  We reviewed and updated her HEP with a strength emphasis.  She should do great long-term with continued HEP compliance.    Comorbidities ORIF clavical repair x 2 in October 2021 and Novemeber 2021    Examination-Activity Limitations Bathing;Dressing;Carry;Reach Overhead    Examination-Participation Restrictions Occupation;Community Activity;Driving    Stability/Clinical Decision Making Stable/Uncomplicated    Rehab Potential Good    PT Frequency 2x / week  PT Duration 8 weeks    PT Treatment/Interventions ADLs/Self Care Home Management;Moist Heat;Cryotherapy;Therapeutic activities;Therapeutic exercise;Neuromuscular re-education;Patient/family education;Manual techniques;Passive range of motion;Vasopneumatic Device;Joint Manipulations    PT Next Visit Plan Capsular stretching for AROM progress, strengthening,    PT Home Exercise Plan Access Code: [email or web address]  URL: [email or web address]  Date: 10/16/2020  Prepared by: Vista Mink    Exercises  Supine Scapular Protraction in Flexion with Dumbbells - 2-3 x daily - 7 x weekly - 1 sets - 20 reps - 3 seconds hold  Supine Shoulder Internal Rotation Stretch - 2-3 x daily - 7 x weekly - 1 sets - 10-20 reps - 10 secondsinutes hold  Supine Shoulder External Rotation Stretch - 2-3 x daily - 7 x weekly - 1 sets - 10-20 reps - 10 seconds hold  Supine Shoulder Flexion AAROM with Hands Clasped - 2-3 x daily - 7 x weekly - 1 sets - 10-20 reps - 10 seconds hold  Standing Shoulder Posterior Capsule Stretch - 2-3 x daily - 7 x weekly - 1 sets - 10 reps - 10 seconds hold  Standing Shoulder Internal Rotation Stretch with Hands Behind Back - 2-3 x daily - 7 x weekly - 1 sets - 10 reps - 10 seconds hold    Consulted and Agree with Plan of Care Patient           Patient will benefit from skilled  therapeutic intervention in order to improve the following deficits and impairments:  Decreased range of motion,Decreased mobility,Hypomobility,Impaired flexibility,Impaired UE functional use,Pain,Increased edema  Visit Diagnosis: Abnormal posture  Chronic right shoulder pain  Stiffness of right shoulder, not elsewhere classified     Problem List Patient Active Problem List   Diagnosis Date Noted  . Acute tension headache 10/15/2020  . AC separation, right, initial encounter   . Traumatic closed fracture of distal clavicle with minimal displacement, right, initial encounter 06/11/2020  . Dyspareunia, female 04/24/2020  . Periumbilical abdominal pain 03/12/2020  . Diarrhea 03/12/2020  . Toe pain, chronic, left 02/28/2020  . GAD (generalized anxiety disorder) 01/02/2020  . Alcohol abuse 01/02/2020  . Hand injury, right, initial encounter 09/26/2019  . Palpitations 04/03/2019  . Seasonal allergies 01/16/2019  . Elevated LFTs 11/15/2018  . 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 Hyperlipidemia 07/08/2018  . Asthma 08/20/2017  . Preventative health care 06/21/2017  . Insomnia 04/16/2017  . Essential hypertension 04/16/2017    Farley Ly PT, MPT 11/06/2020, 5:42 PM  [facility] Physical Therapy [address] [place], [place], [address] Phone: [phone]   Fax:  [phone]  Name: AAYANA REINERTSEN MRN: [id number] Date of Birth: Jan 11, 1971

## 2020-11-08 ENCOUNTER — Other Ambulatory Visit: Payer: Self-pay | Admitting: Primary Care

## 2020-11-08 DIAGNOSIS — G44209 Tension-type headache, unspecified, not intractable: Secondary | ICD-10-CM

## 2020-11-15 ENCOUNTER — Encounter: Payer: Self-pay | Admitting: Orthopaedic Surgery

## 2020-11-18 ENCOUNTER — Other Ambulatory Visit: Payer: Self-pay | Admitting: Physician Assistant

## 2020-11-18 MED ORDER — TRAMADOL HCL 50 MG PO TABS
50.0000 mg | ORAL_TABLET | Freq: Two times a day (BID) | ORAL | 0 refills | Status: DC
Start: 2020-11-18 — End: 2021-01-23

## 2020-11-18 NOTE — Telephone Encounter (Signed)
Sent in

## 2020-12-03 ENCOUNTER — Other Ambulatory Visit: Payer: Self-pay | Admitting: Primary Care

## 2020-12-03 DIAGNOSIS — J4520 Mild intermittent asthma, uncomplicated: Secondary | ICD-10-CM

## 2020-12-03 NOTE — Telephone Encounter (Signed)
Called patient and lvm to call back and schedule cpe.  

## 2020-12-03 NOTE — Telephone Encounter (Signed)
Patient is due for CPE/follow up with me, please have her scheduled for April 2022. Thanks.

## 2020-12-04 NOTE — Telephone Encounter (Signed)
Called patient to schedule cpe. LVM to call back. Letter sent.  

## 2020-12-19 ENCOUNTER — Other Ambulatory Visit: Payer: Self-pay | Admitting: Physician Assistant

## 2020-12-27 ENCOUNTER — Encounter: Payer: Self-pay | Admitting: Family Medicine

## 2020-12-27 ENCOUNTER — Telehealth: Payer: Self-pay

## 2020-12-27 ENCOUNTER — Ambulatory Visit (INDEPENDENT_AMBULATORY_CARE_PROVIDER_SITE_OTHER): Payer: No Typology Code available for payment source | Admitting: Family Medicine

## 2020-12-27 ENCOUNTER — Other Ambulatory Visit: Payer: Self-pay | Admitting: Family Medicine

## 2020-12-27 DIAGNOSIS — H1032 Unspecified acute conjunctivitis, left eye: Secondary | ICD-10-CM | POA: Diagnosis not present

## 2020-12-27 HISTORY — DX: Unspecified acute conjunctivitis, left eye: H10.32

## 2020-12-27 MED ORDER — NEOMYCIN-POLYMYXIN-HC 3.5-10000-1 OP SUSP: 3.0000 [drp] | Freq: Four times a day (QID) | OPHTHALMIC | 0 refills | Status: DC

## 2020-12-27 MED ORDER — NEOMYCIN-POLYMYXIN-DEXAMETH 3.5-10000-0.1 OP SUSP: 2.0000 [drp] | Freq: Four times a day (QID) | OPHTHALMIC | 0 refills | Status: DC

## 2020-12-27 MED ORDER — TOBRAMYCIN-DEXAMETHASONE 0.3-0.1 % OP SUSP: 1.0000 [drp] | Freq: Four times a day (QID) | OPHTHALMIC | 0 refills | Status: DC

## 2020-12-27 NOTE — Telephone Encounter (Signed)
Pharmacy comment: Product Backordered/Unavailable:PRESCRIBED PRODUCT NOT IN STOCK. PLEASE CONSIDER THE COST-EFFECTIVE POTENTIAL ALTERNATIVE(S) LISTED AND EVALUATE IF APPROPRIATE FOR YOUR PATIENT'S INDICATION AND TREATMENT GOALS.  All Pharmacy Suggested Alternatives:   0 dexamethasone (DECADRON) 0.1 % ophthalmic solution 0 gentamicin (GENTAK) 0.3 % ophthalmic ointment 0 neomycin-polymyxin b-dexamethasone (MAXITROL) 3.5-10000-0.1 SUSP  To prescribe one of the alternatives listed above, open the encounter and click Replace. Open

## 2020-12-27 NOTE — Telephone Encounter (Signed)
Which eye drop is related to sulfa.. the first one ( cortisporin) or the second they requested me to change to [facility]) given they were out of the prescribed?

## 2020-12-27 NOTE — Assessment & Plan Note (Signed)
Viral vs allergic most likely. Reviewed symptomatic care and avoiding spread. Recommended saline drops, but she has requested  prescription drops to cover for bacterial infeciton. as she works at a pharmacy.  Will include steroid in eye drops to assist with symptoms care.  If not improving pt will need in person exam to rule out foreign body.

## 2020-12-27 NOTE — Telephone Encounter (Signed)
Pt is allergic to Sulfa. The pharmacy advised her the eye drops that were prescribed could be related to sulfa and cause an issue. Please advise if it is ok for her to take it or send in something else. Contact [facility] [address] and pt. Pt said send MyChart message if she can take it.

## 2020-12-27 NOTE — Addendum Note (Signed)
Addended by: Kerby Nora E on: 12/27/2020 05:38 PM   Modules accepted: Orders

## 2020-12-27 NOTE — Telephone Encounter (Signed)
[facility] at [facility].  He states both eye drops because of the polymyxin-b.   Spoke with Dr. Ermalene Searing who states she will send in different eye drop.  I advised patient to cancel both eye drop prescriptions and we would be sending in a different eye drop.  Brunette notified of this via telephone.

## 2020-12-27 NOTE — Progress Notes (Signed)
VIRTUAL VISIT Due to national recommendations of social distancing due to COVID 19, a virtual visit is felt to be most appropriate for this patient at this time.   I connected with the patient on 12/27/20 at  2:20 PM EDT by virtual telehealth platform and verified that I am speaking with the correct person using two identifiers.   I discussed the limitations, risks, security and privacy concerns of performing an evaluation and management service by  virtual telehealth platform and the availability of in person appointments. I also discussed with the patient that there may be a patient responsible charge related to this service. The patient expressed understanding and agreed to proceed.  Patient location: Home Provider Location: [facility] Participants: Kerby Nora and Maurie Boettcher   Chief Complaint  Patient presents with  . Itchy Eye    Left    History of Present Illness: Conjunctivitis  The current episode started 2 days ago (left eye). The onset was gradual. The problem has been gradually worsening. The symptoms are relieved by one or more OTC medications. Nothing aggravates the symptoms. Associated symptoms include eye itching, congestion, eye discharge and eye redness. Pertinent negatives include no fever, no decreased vision, no double vision, no photophobia, no nausea, no cough, no rash and no eye pain. Associated symptoms comments: Watery discharge. There were no sick contacts. She has received no recent medical care.   Treated for stye... not recently  wearing glasses, no contacts.  COVID 19 screen No recent travel or known exposure to COVID19 The patient denies respiratory symptoms of COVID 19 at this time.  The importance of social distancing was discussed today.   Review of Systems  Constitutional: Negative for fever.  HENT: Positive for congestion.   Eyes: Positive for discharge, redness and itching. Negative for double vision, photophobia and pain.   Respiratory: Negative for cough.   Gastrointestinal: Negative for nausea.  Skin: Negative for rash.      Past Medical History:  Diagnosis Date  . Acute right lower quadrant pain 06/10/2018  . Alcohol abuse   . Asthma   . Hypertension   . Insomnia   . Pain of upper abdomen 06/21/2017  . Palpitations    a. 05/2019 Echo: EF 60-65%, diast dysfxn; b. 05/2019 Zio: Sinus rhythm-sinus tach. Avg HR 105. 6 SVT episodes - longest 7 beats. Rare PVCs-->Diltiazem added.  Marland Kitchen Spongiotic dermatitis     reports that she has never smoked. She has never used smokeless tobacco. She reports current alcohol use. She reports that she does not use drugs.   Current Outpatient Medications:  .  albuterol (VENTOLIN HFA) 108 (90 Base) MCG/ACT inhaler, TAKE 2 PUFFS BY MOUTH EVERY 6 HOURS AS NEEDED FOR WHEEZE OR SHORTNESS OF BREATH, Disp: 6.7 each, Rfl: 0 .  calcium-vitamin D (OSCAL WITH D) 500-200 MG-UNIT tablet, Take 1 tablet by mouth 3 (three) times daily., Disp: 90 tablet, Rfl: 6 .  fluticasone (FLOVENT HFA) 110 MCG/ACT inhaler, Inhale 1 puff into the lungs in the morning and at bedtime. Rinse mouth after each use., Disp: 1 each, Rfl: 2 .  hydrOXYzine (ATARAX/VISTARIL) 10 MG tablet, TAKE 1 TABLET (10 MG TOTAL) BY MOUTH AT BEDTIME AS NEEDED FOR ANXIETY., Disp: 90 tablet, Rfl: 0 .  montelukast (SINGULAIR) 10 MG tablet, TAKE 1 TABLET (10 MG TOTAL) BY MOUTH AT BEDTIME. FOR ALLERGIES/ASTHMA., Disp: 90 tablet, Rfl: 0 .  sertraline (ZOLOFT) 50 MG tablet, Take 1 tablet (50 mg total) by mouth at bedtime. For anxiety.,  Disp: 90 tablet, Rfl: 1 .  SUMAtriptan (IMITREX) 50 MG tablet, Take 1 tablet by mouth at migraine onset. May repeat in 2 hours if headache persists or recurs., Disp: 10 tablet, Rfl: 0 .  TIADYLT ER 180 MG 24 hr capsule, Take 180 mg by mouth at bedtime., Disp: , Rfl:  .  tiZANidine (ZANAFLEX) 2 MG tablet, Take 2-4 mg by mouth every 6 (six) hours as needed., Disp: , Rfl:  .  traMADol (ULTRAM) 50 MG tablet, Take 1  tablet (50 mg total) by mouth 2 (two) times daily., Disp: 30 tablet, Rfl: 0   Observations/Objective: Last menstrual period 03/05/2014.  Physical Exam  Physical Exam Constitutional:      General: The patient is not in acute distress. Slightly red conjunctiva in left eye. Pulmonary:     Effort: Pulmonary effort is normal. No respiratory distress.  Neurological:     Mental Status: The patient is alert and oriented to person, place, and time.  Psychiatric:        Mood and Affect: Mood normal.        Behavior: Behavior normal.   Assessment and Plan Problem List Items Addressed This Visit    Acute conjunctivitis of left eye - Primary    Viral vs allergic most likely. Reviewed symptomatic care and avoiding spread. Recommended saline drops, but she has requested  prescription drops to cover for bacterial infeciton. as she works at a pharmacy.  Will include steroid in eye drops to assist with symptoms care.  If not improving pt will need in person exam to rule out foreign body.           I discussed the assessment and treatment plan with the patient. The patient was provided an opportunity to ask questions and all were answered. The patient agreed with the plan and demonstrated an understanding of the instructions.   The patient was advised to call back or seek an in-person evaluation if the symptoms worsen or if the condition fails to improve as anticipated.     Kerby Nora, MD

## 2020-12-29 ENCOUNTER — Other Ambulatory Visit: Payer: Self-pay | Admitting: Cardiovascular Disease

## 2020-12-30 NOTE — Telephone Encounter (Signed)
Please advise if ok to refill Historical Provider medication. 

## 2021-01-15 ENCOUNTER — Ambulatory Visit (INDEPENDENT_AMBULATORY_CARE_PROVIDER_SITE_OTHER): Payer: No Typology Code available for payment source

## 2021-01-15 ENCOUNTER — Ambulatory Visit (INDEPENDENT_AMBULATORY_CARE_PROVIDER_SITE_OTHER): Payer: No Typology Code available for payment source | Admitting: Orthopaedic Surgery

## 2021-01-15 ENCOUNTER — Other Ambulatory Visit: Payer: Self-pay

## 2021-01-15 DIAGNOSIS — M898X1 Other specified disorders of bone, shoulder: Secondary | ICD-10-CM

## 2021-01-15 NOTE — Progress Notes (Signed)
Post-Op Visit Note   Patient: Kelly Walton           Date of Birth: 21-Nov-1970           MRN: [id number] Visit Date: 01/15/2021 PCP: Doreene Nest, NP   Assessment & Plan:  Chief Complaint:  Chief Complaint  Patient presents with  . Right Shoulder - Pain    Hx revision right ORIF clavicle fx AC seperation   Visit Diagnoses:  1. Pain of right clavicle     Plan: Patient is a pleasant 50 year old female who comes in today little over 6 months out revision ORIF right clavicle fracture and CC ligament reconstruction.  She has been doing okay.  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  She has had increased pain alongside this.  She still notes sensitivity with wearing a bra strap.  She has finished physical therapy.  No new injury.   Examination of the right clavicle reveals increased sensitivity along the incision.  She has near full range of motion of the shoulder.  She is neurovascular intact distally.  Strength is fairly normal with slight limitation due to pain.  At this point, her fracture has healed and impression is that her shoulder is hurting due to overuse.  She will continue to advance activity as tolerated.  Follow-up with [place] as needed.  Follow-Up Instructions: Return if symptoms worsen or fail to improve.   Orders:  Orders Placed This Encounter  Procedures  . XR Clavicle Right   No orders of the defined types were placed in this encounter.   Imaging: XR Clavicle Right  Result Date: 01/15/2021 Healed fracture without hardware complication.     PMFS History: Patient Active Problem List   Diagnosis Date Noted  . Acute conjunctivitis of left eye 12/27/2020  . Acute tension headache 10/15/2020  . AC separation, right, initial encounter   . Traumatic closed fracture of distal clavicle with minimal displacement, right, initial encounter 06/11/2020  . Dyspareunia, female 04/24/2020  .  Periumbilical abdominal pain 03/12/2020  . Diarrhea 03/12/2020  . Toe pain, chronic, left 02/28/2020  . GAD (generalized anxiety disorder) 01/02/2020  . Alcohol abuse 01/02/2020  . Hand injury, right, initial encounter 09/26/2019  . Palpitations 04/03/2019  . Seasonal allergies 01/16/2019  . Elevated LFTs 11/15/2018  . 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 Hyperlipidemia 07/08/2018  . Asthma 08/20/2017  . Preventative health care 06/21/2017  . Insomnia 04/16/2017  . Essential hypertension 04/16/2017   Past Medical History:  Diagnosis Date  . Acute right lower quadrant pain 06/10/2018  . Alcohol abuse   . Asthma   . Hypertension   . Insomnia   . Pain of upper abdomen 06/21/2017  . Palpitations    a. 05/2019 Echo: EF 60-65%, diast dysfxn; b. 05/2019 Zio: Sinus rhythm-sinus tach. Avg HR 105. 6 SVT episodes - longest 7 beats. Rare PVCs-->Diltiazem added.  Marland Kitchen Spongiotic dermatitis     Family History  Problem Relation Age of Onset  . Arthritis Mother   . Arthritis Father   . Lung cancer Father   . Heart attack Father   . Hyperlipidemia Maternal Grandmother   . Stroke Maternal Grandmother   . Hypertension Maternal Grandmother   . Heart attack Maternal Grandmother   . Alcohol abuse Paternal Grandmother   . Arthritis Paternal Grandmother   . Mental illness Paternal Grandmother   . Breast cancer Maternal Aunt   . Epilepsy  Brother     Past Surgical History:  Procedure Laterality Date  . CESAREAN SECTION    . FRACTURE SURGERY    . ORIF CLAVICULAR FRACTURE Right 06/17/2020   Procedure: OPEN REDUCTION INTERNAL FIXATION (ORIF) RIGHT CLAVICLE FRACTURE, ACROMIOCLAVICULAR SEPARATION;  Surgeon: Tarry Kos, MD;  Location: [facility];  Service: Orthopedics;  Laterality: Right;  . ORIF CLAVICULAR FRACTURE Right 07/05/2020   Procedure: REVISION OPEN REDUCTION INTERNAL FIXATION (ORIF) RIGHT CLAVICLE FRACTURE, ACROMIOCLAVICULAR SEPARATION;  Surgeon: Tarry Kos, MD;  Location:  [facility];  Service: Orthopedics;  Laterality: Right;  . ROBOTIC ASSISTED TOTAL HYSTERECTOMY N/A 03/05/2014   Procedure: ROBOTIC ASSISTED TOTAL HYSTERECTOMY/BILATERAL SALPINGECTOMY;  Surgeon: Serita Kyle, MD;  Location: [facility];  Service: Gynecology;  Laterality: N/A;  . TUBAL LIGATION     Social History   Occupational History  . Not on file  Tobacco Use  . Smoking status: Never Smoker  . Smokeless tobacco: Never Used  Vaping Use  . Vaping Use: Never used  Substance and Sexual Activity  . Alcohol use: Yes    Comment: occasion  . Drug use: No  . Sexual activity: Not on file

## 2021-01-16 ENCOUNTER — Other Ambulatory Visit: Payer: Self-pay | Admitting: Primary Care

## 2021-01-16 DIAGNOSIS — F411 Generalized anxiety disorder: Secondary | ICD-10-CM

## 2021-01-16 NOTE — Telephone Encounter (Signed)
Patient is due for CPE/follow up, this will be required prior to any further refills.  Please schedule.   

## 2021-01-20 NOTE — Telephone Encounter (Signed)
Left message to return call to our office. Will need CPE app when calls back

## 2021-01-22 ENCOUNTER — Encounter: Payer: Self-pay | Admitting: Orthopaedic Surgery

## 2021-01-23 ENCOUNTER — Other Ambulatory Visit: Payer: Self-pay | Admitting: Physician Assistant

## 2021-01-23 MED ORDER — TRAMADOL HCL 50 MG PO TABS: 50.0000 mg | ORAL_TABLET | Freq: Every day | ORAL | 3 refills | Status: DC | PRN

## 2021-01-23 NOTE — Telephone Encounter (Signed)
Sent in

## 2021-01-24 NOTE — Telephone Encounter (Signed)
App has been made for 5/31. No further action needed at this time.

## 2021-02-04 ENCOUNTER — Encounter: Payer: Self-pay | Admitting: Primary Care

## 2021-02-04 ENCOUNTER — Other Ambulatory Visit: Payer: Self-pay

## 2021-02-04 ENCOUNTER — Ambulatory Visit (INDEPENDENT_AMBULATORY_CARE_PROVIDER_SITE_OTHER): Payer: No Typology Code available for payment source | Admitting: Primary Care

## 2021-02-04 VITALS — BP 160/98 | HR 98 | Temp 97.9°F | Ht 64.0 in | Wt 121.0 lb

## 2021-02-04 DIAGNOSIS — F411 Generalized anxiety disorder: Secondary | ICD-10-CM

## 2021-02-04 DIAGNOSIS — F1010 Alcohol abuse, uncomplicated: Secondary | ICD-10-CM | POA: Diagnosis not present

## 2021-02-04 DIAGNOSIS — Z8781 Personal history of (healed) traumatic fracture: Secondary | ICD-10-CM

## 2021-02-04 DIAGNOSIS — Z0000 Encounter for general adult medical examination without abnormal findings: Secondary | ICD-10-CM | POA: Diagnosis not present

## 2021-02-04 DIAGNOSIS — Z1231 Encounter for screening mammogram for malignant neoplasm of breast: Secondary | ICD-10-CM

## 2021-02-04 DIAGNOSIS — G4700 Insomnia, unspecified: Secondary | ICD-10-CM

## 2021-02-04 DIAGNOSIS — I10 Essential (primary) hypertension: Secondary | ICD-10-CM

## 2021-02-04 DIAGNOSIS — R002 Palpitations: Secondary | ICD-10-CM

## 2021-02-04 DIAGNOSIS — E785 Hyperlipidemia, unspecified: Secondary | ICD-10-CM

## 2021-02-04 DIAGNOSIS — J302 Other seasonal allergic rhinitis: Secondary | ICD-10-CM

## 2021-02-04 DIAGNOSIS — J4520 Mild intermittent asthma, uncomplicated: Secondary | ICD-10-CM

## 2021-02-04 DIAGNOSIS — Z114 Encounter for screening for human immunodeficiency virus [HIV]: Secondary | ICD-10-CM

## 2021-02-04 DIAGNOSIS — Z1211 Encounter for screening for malignant neoplasm of colon: Secondary | ICD-10-CM

## 2021-02-04 NOTE — Progress Notes (Signed)
Subjective:    Patient ID: Kelly Walton, female    DOB: September 02, 1971, 50 y.o.   MRN: [id number]  HPI  Kelly Walton is a very pleasant 50 y.o. female who presents today for complete physical.  Immunizations: -Tetanus: 2016 -Influenza: Did not complete this season  -Covid-19: 2 vaccines   Diet: Fair diet.  Exercise: No regular exercise.  Eye exam: Completes annually  Dental exam: Completes semi-annually   Pap Smear: Partial hysterectomy  Mammogram: No recent mammogram  Colonoscopy: never completed  BP Readings from Last 3 Encounters:  02/04/21 (!) 158/76  10/15/20 130/82  07/05/20 135/81       Review of Systems  Constitutional: Negative for unexpected weight change.  HENT: Negative for rhinorrhea.   Eyes: Negative for visual disturbance.  Respiratory: Negative for shortness of breath.   Cardiovascular: Negative for chest pain.  Gastrointestinal: Negative for constipation and diarrhea.  Genitourinary: Negative for difficulty urinating.  Musculoskeletal: Positive for arthralgias.  Skin: Negative for rash.  Allergic/Immunologic: Positive for environmental allergies.  Neurological: Negative for dizziness and headaches.  Psychiatric/Behavioral: The patient is not nervous/anxious.          Past Medical History:  Diagnosis Date  . Acute right lower quadrant pain 06/10/2018  . Alcohol abuse   . Asthma   . Hypertension   . Insomnia   . Pain of upper abdomen 06/21/2017  . Palpitations    a. 05/2019 Echo: EF 60-65%, diast dysfxn; b. 05/2019 Zio: Sinus rhythm-sinus tach. Avg HR 105. 6 SVT episodes - longest 7 beats. Rare PVCs-->Diltiazem added.  Marland Kitchen Spongiotic dermatitis     Social History   Socioeconomic History  . Marital status: Single    Spouse name: Not on file  . Number of children: Not on file  . Years of education: Not on file  . Highest education level: Not on file  Occupational History  . Not on file  Tobacco Use  . Smoking status: Never Smoker   . Smokeless tobacco: Never Used  Vaping Use  . Vaping Use: Never used  Substance and Sexual Activity  . Alcohol use: Yes    Comment: occasion  . Drug use: No  . Sexual activity: Not on file  Other Topics Concern  . Not on file  Social History Narrative   Single.   2 children, 1 step-son.   Manager at [employer].   Enjoys going to [employer], spending time with family.    Social Determinants of Health   Financial Resource Strain: Not on file  Food Insecurity: Not on file  Transportation Needs: Not on file  Physical Activity: Not on file  Stress: Not on file  Social Connections: Not on file  Intimate Partner Violence: Not on file    Past Surgical History:  Procedure Laterality Date  . CESAREAN SECTION    . FRACTURE SURGERY    . ORIF CLAVICULAR FRACTURE Right 06/17/2020   Procedure: OPEN REDUCTION INTERNAL FIXATION (ORIF) RIGHT CLAVICLE FRACTURE, ACROMIOCLAVICULAR SEPARATION;  Surgeon: Tarry Kos, MD;  Location: [facility];  Service: Orthopedics;  Laterality: Right;  . ORIF CLAVICULAR FRACTURE Right 07/05/2020   Procedure: REVISION OPEN REDUCTION INTERNAL FIXATION (ORIF) RIGHT CLAVICLE FRACTURE, ACROMIOCLAVICULAR SEPARATION;  Surgeon: Tarry Kos, MD;  Location: [facility];  Service: Orthopedics;  Laterality: Right;  . ROBOTIC ASSISTED TOTAL HYSTERECTOMY N/A 03/05/2014   Procedure: ROBOTIC ASSISTED TOTAL HYSTERECTOMY/BILATERAL SALPINGECTOMY;  Surgeon: Serita Kyle, MD;  Location: [facility];  Service: Gynecology;  Laterality: N/A;  .  TUBAL LIGATION      Family History  Problem Relation Age of Onset  . Arthritis Mother   . Arthritis Father   . Lung cancer Father   . Heart attack Father   . Hyperlipidemia Maternal Grandmother   . Stroke Maternal Grandmother   . Hypertension Maternal Grandmother   . Heart attack Maternal Grandmother   . Alcohol abuse Paternal Grandmother   . Arthritis Paternal Grandmother   . Mental illness Paternal Grandmother   . Breast cancer  Maternal Aunt   . Epilepsy Brother     Allergies  Allergen Reactions  . Promethazine Hcl Hives and Rash  . Sulfa Antibiotics Hives and Rash    Pt states sent her to the hospital.  . Trazodone And Nefazodone     Hallucinations     Current Outpatient Medications on File Prior to Visit  Medication Sig Dispense Refill  . albuterol (VENTOLIN HFA) 108 (90 Base) MCG/ACT inhaler TAKE 2 PUFFS BY MOUTH EVERY 6 HOURS AS NEEDED FOR WHEEZE OR SHORTNESS OF BREATH 6.7 each 0  . fluticasone (FLOVENT HFA) 110 MCG/ACT inhaler Inhale 1 puff into the lungs in the morning and at bedtime. Rinse mouth after each use. 1 each 2  . hydrOXYzine (ATARAX/VISTARIL) 10 MG tablet TAKE 1 TABLET (10 MG TOTAL) BY MOUTH AT BEDTIME AS NEEDED FOR ANXIETY. 30 tablet 0  . montelukast (SINGULAIR) 10 MG tablet TAKE 1 TABLET (10 MG TOTAL) BY MOUTH AT BEDTIME. FOR ALLERGIES/ASTHMA. 90 tablet 0  . sertraline (ZOLOFT) 50 MG tablet Take 1 tablet (50 mg total) by mouth at bedtime. For anxiety. 90 tablet 1  . SUMAtriptan (IMITREX) 50 MG tablet Take 1 tablet by mouth at migraine onset. May repeat in 2 hours if headache persists or recurs. 10 tablet 0  . tiZANidine (ZANAFLEX) 2 MG tablet Take 2-4 mg by mouth every 6 (six) hours as needed.    . traMADol (ULTRAM) 50 MG tablet Take 1 tablet (50 mg total) by mouth daily as needed. 30 tablet 3  . TIADYLT ER 180 MG 24 hr capsule TAKE 1 CAPSULE BY MOUTH EVERY DAY (Patient not taking: Reported on 02/04/2021) 90 capsule 3   No current facility-administered medications on file prior to visit.    BP (!) 158/76   Pulse 98   Temp 97.9 F (36.6 C) (Temporal)   Ht 5\' 4"  (1.626 m)   Wt 121 lb (54.9 kg)   LMP 03/05/2014 Comment: LMP x 45yrs ago   SpO2 97%   BMI 20.77 kg/m  Objective:   Physical Exam HENT:     Right Ear: Tympanic membrane and ear canal normal.     Left Ear: Tympanic membrane and ear canal normal.     Nose: Nose normal.  Eyes:     Conjunctiva/sclera: Conjunctivae normal.      Pupils: Pupils are equal, round, and reactive to light.  Neck:     Thyroid: No thyromegaly.  Cardiovascular:     Rate and Rhythm: Normal rate and regular rhythm.     Heart sounds: No murmur heard.   Pulmonary:     Effort: Pulmonary effort is normal.     Breath sounds: Normal breath sounds. No rales.  Abdominal:     General: Bowel sounds are normal.     Palpations: Abdomen is soft.     Tenderness: There is no abdominal tenderness.  Musculoskeletal:        General: Normal range of motion.     Cervical back: Neck supple.  Lymphadenopathy:     Cervical: No cervical adenopathy.  Skin:    General: Skin is warm and dry.     Findings: No rash.  Neurological:     Mental Status: She is alert and oriented to person, place, and time.     Cranial Nerves: No cranial nerve deficit.     Deep Tendon Reflexes: Reflexes are normal and symmetric.           Assessment & Plan:      This visit occurred during the SARS-CoV-2 public health emergency.  Safety protocols were in place, including screening questions prior to the visit, additional usage of staff PPE, and extensive cleaning of exam room while observing appropriate contact time as indicated for disinfecting solutions.

## 2021-02-04 NOTE — Assessment & Plan Note (Signed)
Immunizations UTD. Colonoscopy due, referral placed. Mammogram due, orders placed.  Discussed the importance of a healthy diet and regular exercise in order for weight loss, and to reduce the risk of any potential medical problems.  Exam today stable. Labs pending.

## 2021-02-04 NOTE — Assessment & Plan Note (Addendum)
Above goal in the office today, higher on recheck. Previously on amlodipine and Tiadylt at different times, no longer on either.  Will resume treatment, await labs.  Likely going to start either olmesartan 20 mg or HCTZ 12.5 mg.

## 2021-02-04 NOTE — Assessment & Plan Note (Signed)
Doing well on Zoloft 50 mg, continue same.  

## 2021-02-04 NOTE — Assessment & Plan Note (Signed)
Discussed the importance of a healthy diet and regular exercise in order for weight loss, and to reduce the risk of any potential medical problems.  Repeat lipid panel pending. 

## 2021-02-04 NOTE — Patient Instructions (Signed)
Stop by the lab prior to leaving today. I will notify you of your results once received.   Monitor your blood pressure over the next week, notify me of your readings in one week.  It was a pleasure to see you today!   Preventive Care 63-48 Years Old, Female Preventive care refers to lifestyle choices and visits with your health care provider that can promote health and wellness. This includes:  A yearly physical exam. This is also called an annual wellness visit.  Regular dental and eye exams.  Immunizations.  Screening for certain conditions.  Healthy lifestyle choices, such as: ? Eating a healthy diet. ? Getting regular exercise. ? Not using drugs or products that contain nicotine and tobacco. ? Limiting alcohol use. What can I expect for my preventive care visit? Physical exam Your health care provider will check your:  Height and weight. These may be used to calculate your BMI (body mass index). BMI is a measurement that tells if you are at a healthy weight.  Heart rate and blood pressure.  Body temperature.  Skin for abnormal spots. Counseling Your health care provider may ask you questions about your:  Past medical problems.  Family's medical history.  Alcohol, tobacco, and drug use.  Emotional well-being.  Home life and relationship well-being.  Sexual activity.  Diet, exercise, and sleep habits.  Work and work Statistician.  Access to firearms.  Method of birth control.  Menstrual cycle.  Pregnancy history. What immunizations do I need? Vaccines are usually given at various ages, according to a schedule. Your health care provider will recommend vaccines for you based on your age, medical history, and lifestyle or other factors, such as travel or where you work.   What tests do I need? Blood tests  Lipid and cholesterol levels. These may be checked every 5 years, or more often if you are over 63 years old.  Hepatitis C test.  Hepatitis B  test. Screening  Lung cancer screening. You may have this screening every year starting at age 39 if you have a 30-pack-year history of smoking and currently smoke or have quit within the past 15 years.  Colorectal cancer screening. ? All adults should have this screening starting at age 8 and continuing until age 60. ? Your health care provider may recommend screening at age 32 if you are at increased risk. ? You will have tests every 1-10 years, depending on your results and the type of screening test.  Diabetes screening. ? This is done by checking your blood sugar (glucose) after you have not eaten for a while (fasting). ? You may have this done every 1-3 years.  Mammogram. ? This may be done every 1-2 years. ? Talk with your health care provider about when you should start having regular mammograms. This may depend on whether you have a family history of breast cancer.  BRCA-related cancer screening. This may be done if you have a family history of breast, ovarian, tubal, or peritoneal cancers.  Pelvic exam and Pap test. ? This may be done every 3 years starting at age 89. ? Starting at age 76, this may be done every 5 years if you have a Pap test in combination with an HPV test. Other tests  STD (sexually transmitted disease) testing, if you are at risk.  Bone density scan. This is done to screen for osteoporosis. You may have this scan if you are at high risk for osteoporosis. Talk with your health care provider  about your test results, treatment options, and if necessary, the need for more tests. Follow these instructions at home: Eating and drinking  Eat a diet that includes fresh fruits and vegetables, whole grains, lean protein, and low-fat dairy products.  Take vitamin and mineral supplements as recommended by your health care provider.  Do not drink alcohol if: ? Your health care provider tells you not to drink. ? You are pregnant, may be pregnant, or are planning  to become pregnant.  If you drink alcohol: ? Limit how much you have to 0-1 drink a day. ? Be aware of how much alcohol is in your drink. In the [place] one drink equals one 12 oz bottle of beer (355 mL), one 5 oz glass of wine (148 mL), or one 1 oz glass of hard liquor (44 mL).   Lifestyle  Take daily care of your teeth and gums. Brush your teeth every morning and night with fluoride toothpaste. Floss one time each day.  Stay active. Exercise for at least 30 minutes 5 or more days each week.  Do not use any products that contain nicotine or tobacco, such as cigarettes, e-cigarettes, and chewing tobacco. If you need help quitting, ask your health care provider.  Do not use drugs.  If you are sexually active, practice safe sex. Use a condom or other form of protection to prevent STIs (sexually transmitted infections).  If you do not wish to become pregnant, use a form of birth control. If you plan to become pregnant, see your health care provider for a prepregnancy visit.  If told by your health care provider, take low-dose aspirin daily starting at age 67.  Find healthy ways to cope with stress, such as: ? Meditation, yoga, or listening to music. ? Journaling. ? Talking to a trusted person. ? Spending time with friends and family. Safety  Always wear your seat belt while driving or riding in a vehicle.  Do not drive: ? If you have been drinking alcohol. Do not ride with someone who has been drinking. ? When you are tired or distracted. ? While texting.  Wear a helmet and other protective equipment during sports activities.  If you have firearms in your house, make sure you follow all gun safety procedures. What's next?  Visit your health care provider once a year for an annual wellness visit.  Ask your health care provider how often you should have your eyes and teeth checked.  Stay up to date on all vaccines. This information is not intended to replace advice given to you  by your health care provider. Make sure you discuss any questions you have with your health care provider. Document Revised: 05/28/2020 Document Reviewed: 05/05/2018 Elsevier Patient Education  2021 [employer].

## 2021-02-04 NOTE — Assessment & Plan Note (Signed)
S/P surgical treatment x 2, follows with orthopedics. Infrequent use of Tramadol and Tizanidine.

## 2021-02-04 NOTE — Assessment & Plan Note (Signed)
Doing well on Singulair 10 mg, continue same.

## 2021-02-04 NOTE — Assessment & Plan Note (Signed)
Improved, endorses drinking 1-2 shots twice weekly. Continue to monitor.

## 2021-02-04 NOTE — Assessment & Plan Note (Signed)
Well controlled on PRN Flovent and albuterol, and Singulair 10 mg daily. Continue to monitor.

## 2021-02-04 NOTE — Assessment & Plan Note (Signed)
Continued, no improvement with Tiadylt ER. Recommended she follow up with cardiology if symptoms persisted.

## 2021-02-05 LAB — COMPREHENSIVE METABOLIC PANEL
ALT: 17 U/L (ref 0–35)
AST: 25 U/L (ref 0–37)
Albumin: 4.7 g/dL (ref 3.5–5.2)
Alkaline Phosphatase: 80 U/L (ref 39–117)
BUN: 14 mg/dL (ref 6–23)
CO2: 26 mEq/L (ref 19–32)
Calcium: 10 mg/dL (ref 8.4–10.5)
Chloride: 101 mEq/L (ref 96–112)
Creatinine, Ser: 1 mg/dL (ref 0.40–1.20)
GFR: 66.42 mL/min (ref >60.00)
Glucose, Bld: 96 mg/dL (ref 70–99)
Potassium: 4.6 mEq/L (ref 3.5–5.1)
Sodium: 139 mEq/L (ref 135–145)
Total Bilirubin: 0.5 mg/dL (ref 0.2–1.2)
Total Protein: 7.5 g/dL (ref 6.0–8.3)

## 2021-02-05 LAB — LIPID PANEL
Cholesterol: 318 mg/dL — ABNORMAL HIGH (ref 0–200)
HDL: 121.1 mg/dL (ref >39.00)
NonHDL: 196.75
Total CHOL/HDL Ratio: 3
Triglycerides: 251 mg/dL — ABNORMAL HIGH (ref 0.0–149.0)
VLDL: 50.2 mg/dL — ABNORMAL HIGH (ref 0.0–40.0)

## 2021-02-05 LAB — CBC
HCT: 43.5 % (ref 36.0–46.0)
Hemoglobin: 14.4 g/dL (ref 12.0–15.0)
MCHC: 33 g/dL (ref 30.0–36.0)
MCV: 91.8 fl (ref 78.0–100.0)
Platelets: 210 10*3/uL (ref 150.0–400.0)
RBC: 4.74 Mil/uL (ref 3.87–5.11)
RDW: 14.3 % (ref 11.5–15.5)
WBC: 7.9 10*3/uL (ref 4.0–10.5)

## 2021-02-05 LAB — LDL CHOLESTEROL, DIRECT: Direct LDL: 168 mg/dL

## 2021-02-05 LAB — HIV ANTIBODY (ROUTINE TESTING W REFLEX): HIV 1&2 Ab, 4th Generation: NONREACTIVE

## 2021-02-06 ENCOUNTER — Other Ambulatory Visit: Payer: Self-pay | Admitting: Primary Care

## 2021-02-06 DIAGNOSIS — I10 Essential (primary) hypertension: Secondary | ICD-10-CM

## 2021-02-06 MED ORDER — OLMESARTAN MEDOXOMIL 20 MG PO TABS: 20.0000 mg | ORAL_TABLET | Freq: Every day | ORAL | 0 refills | Status: DC

## 2021-02-08 ENCOUNTER — Other Ambulatory Visit: Payer: Self-pay | Admitting: Primary Care

## 2021-02-08 DIAGNOSIS — F411 Generalized anxiety disorder: Secondary | ICD-10-CM

## 2021-02-11 ENCOUNTER — Encounter: Payer: Self-pay | Admitting: Orthopaedic Surgery

## 2021-02-13 ENCOUNTER — Other Ambulatory Visit: Payer: Self-pay | Admitting: Primary Care

## 2021-02-13 DIAGNOSIS — G44209 Tension-type headache, unspecified, not intractable: Secondary | ICD-10-CM

## 2021-02-13 NOTE — Telephone Encounter (Signed)
Does she actually need a refill? It looks like she has pended the sumatriptan for removal. I also don't see a migraine history or remembering her having this.

## 2021-02-17 NOTE — Telephone Encounter (Signed)
Left message to return call to our office.  

## 2021-02-20 NOTE — Telephone Encounter (Signed)
Left message to return call to our office.  

## 2021-02-24 ENCOUNTER — Telehealth: Payer: Self-pay

## 2021-02-24 NOTE — Telephone Encounter (Signed)
Spoke to patient by telephone and was advised that she is currently at the [facility] ED waiting to be seen. Patient stated that she decided to go to the ER because her head started hurting real bad and she feels that she may have broken some ribs. Advised patient to update [name] NP after her treatment there and she verbalized understanding.

## 2021-02-24 NOTE — Telephone Encounter (Signed)
PLEASE NOTE: All timestamps contained within this report are represented as [place] Standard Time. CONFIDENTIALTY NOTICE: This fax transmission is intended only for the addressee. It contains information that is legally privileged, confidential or otherwise protected from use or disclosure. If you are not the intended recipient, you are strictly prohibited from reviewing, disclosing, copying using or disseminating any of this information or taking any action in reliance on or regarding this information. If you have received this fax in error, please notify us immediately by telephone so that we can arrange for its return to [place]. Phone: [phone], Toll-Free: [phone], Fax: [phone] Page: 1 of 2 Call Id: [id number] [facility] Day - Client TELEPHONE ADVICE RECORD AccessNurse Patient Name: Kelly Walton [facility] Gender: Female DOB: 1971/08/06 Age: 50 Y 11 M 23 D Return Phone Number: [phone] (Secondary) Address: City/ State/ Zip: [place]  [address] Client [facility] Day - Client Client Site [facility] - Day Physician Vernona Rieger - NP Contact Type Call Who Is Calling Patient / Member / Family / Caregiver Call Type Triage / Clinical Caller Name Kelly Walton Relationship To Patient Partner Return Phone Number [phone] (Secondary) Chief Complaint FAINTING or PASSING OUT Reason for Call Symptomatic / Request for Health Information Initial Comment Caller states his fianc fainted and fell. There is no appointments at the office for today. GOTO Facility Not Listed [facility] Translation No Nurse Assessment Nurse: Kelly Blacksmith, Kelly Walton, Kelly Walton Date/Time (Eastern Time): 02/24/2021 9:05:18 AM Confirm and document reason for call. If symptomatic, describe symptoms. ---Caller states his fianc fainted and fell on her face approx. 1 hour ago these have been occurring X 3 months. Denies any other current  symptoms Does the patient have any new or worsening symptoms? ---Yes Will a triage be completed? ---Yes Related visit to physician within the last 2 weeks? ---No Does the PT have any chronic conditions? (i.e. diabetes, asthma, this includes High risk factors for pregnancy, etc.) ---Yes List chronic conditions. ---HTN Is the patient pregnant or possibly pregnant? (Ask all females between the ages of 41-55) ---No Is this a behavioral health or substance abuse call? ---No Guidelines Guideline Title Affirmed Question Affirmed Notes Nurse Date/Time Kelly Walton Time) Fainting Any head or face injury Emch, Kelly Walton, Kelly Walton 02/24/2021 9:07:19 AM Disp. Time Kelly Walton Time) Disposition Final User 02/24/2021 9:03:42 AM Send to Urgent Queue Kelly Walton PLEASE NOTE: All timestamps contained within this report are represented as [place] Standard Time. CONFIDENTIALTY NOTICE: This fax transmission is intended only for the addressee. It contains information that is legally privileged, confidential or otherwise protected from use or disclosure. If you are not the intended recipient, you are strictly prohibited from reviewing, disclosing, copying using or disseminating any of this information or taking any action in reliance on or regarding this information. If you have received this fax in error, please notify us immediately by telephone so that we can arrange for its return to [place]. Phone: [phone], Toll-Free: [phone], Fax: [phone] Page: 2 of 2 Call Id: [id number] 02/24/2021 9:11:20 AM Go to ED Now Yes Emch, Kelly Walton, Kelly Walton Disagree/Comply Disagree Caller Understands Yes PreDisposition Did not know what to do Care Advice Given Per Guideline GO TO ED NOW: * Leave now. Drive carefully. ANOTHER ADULT SHOULD DRIVE: * It is better and safer if another adult drives instead of you. * Bring a list of your current medicines when you go to the Emergency Department (ER). BRING MEDICINES: * Difficulty  breathing or confusion occurs  CALL EMS 911 IF: * Faints again * Too dizzy or weak to stand * You become worse CARE ADVICE given per Fainting (Adult) guideline. Referrals GO TO FACILITY OTHER - SPECIFY

## 2021-02-24 NOTE — Telephone Encounter (Signed)
Called patient about unrelated issue. States that she had fall today.  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 She denies any nausea or vomiting, weakness, changes in vision or memory before or after. Did have just a little bit of dizziness when she turned to grab tote. She hit right side of face and side. Has jaw, rib and elbow pain. She states that she spoke to a nurse in our office and was told to go to Ed. Patient refuses to go to ed at this time. States she is not going to sit in a germ infested waiting room for 8 hours for nothing. Advised patient the reason for going to Ed with any head injury. Also reviewed all red words with her for reasons to call 911. She has agreed to call a walk in near her and see if she can be seen. Please call patient to triage and see what next steps are

## 2021-02-25 NOTE — Telephone Encounter (Signed)
Can we get ED notes from [facility]?

## 2021-02-27 ENCOUNTER — Other Ambulatory Visit: Payer: Self-pay | Admitting: Primary Care

## 2021-02-27 DIAGNOSIS — J4520 Mild intermittent asthma, uncomplicated: Secondary | ICD-10-CM

## 2021-03-03 ENCOUNTER — Telehealth: Payer: Self-pay | Admitting: Cardiovascular Disease

## 2021-03-03 NOTE — Telephone Encounter (Signed)
     Pt is requesting to switch from Dr. Kirke Corin to Dr. Servando Salina, she recently move and she is closer to [facility] office.

## 2021-03-03 NOTE — Telephone Encounter (Signed)
Fine with me

## 2021-03-06 NOTE — Telephone Encounter (Signed)
Left message to return call to our office.  Need to get name of ED she went to

## 2021-03-07 NOTE — Telephone Encounter (Signed)
    Spoke with pt and decided to Switch to Dr. Dulce Sellar instead, she said she can't come to [place] office, she prefers [place] because it is a lot closer to her

## 2021-03-12 NOTE — Telephone Encounter (Signed)
Left message to return call to our office.  

## 2021-03-17 ENCOUNTER — Other Ambulatory Visit: Payer: Self-pay | Admitting: Primary Care

## 2021-03-17 DIAGNOSIS — F411 Generalized anxiety disorder: Secondary | ICD-10-CM

## 2021-03-21 NOTE — Telephone Encounter (Signed)
Have been trying to reach patient to get name of where she was seen. Have not been able to reach. Have called x 3 no call back.

## 2021-03-24 NOTE — Telephone Encounter (Signed)
Mychart message to pt requesting she call the clinic.

## 2021-03-28 ENCOUNTER — Encounter: Payer: Self-pay | Admitting: Family Medicine

## 2021-04-02 ENCOUNTER — Telehealth: Payer: Self-pay

## 2021-04-02 NOTE — Telephone Encounter (Signed)
PA HAS BEEN APPROVED   Kelly Walton KeyDeri Fuelling - PA Case ID: [id number] - Rx #: [id number] Need help? Call us at [phone] Outcome Approvedtoday Your PA request has been approved. Additional information will be provided in the approval communication. (Message 1145)

## 2021-04-02 NOTE — Telephone Encounter (Signed)
PA done and approved.

## 2021-04-13 ENCOUNTER — Other Ambulatory Visit: Payer: Self-pay | Admitting: Family Medicine

## 2021-04-18 ENCOUNTER — Other Ambulatory Visit: Payer: Self-pay

## 2021-04-18 ENCOUNTER — Ambulatory Visit (INDEPENDENT_AMBULATORY_CARE_PROVIDER_SITE_OTHER): Payer: No Typology Code available for payment source | Admitting: Nurse Practitioner

## 2021-04-18 ENCOUNTER — Encounter: Payer: Self-pay | Admitting: Nurse Practitioner

## 2021-04-18 VITALS — BP 162/90 | HR 111 | Temp 98.5°F | Resp 12 | Ht 64.0 in | Wt 118.2 lb

## 2021-04-18 DIAGNOSIS — R000 Tachycardia, unspecified: Secondary | ICD-10-CM | POA: Insufficient documentation

## 2021-04-18 DIAGNOSIS — R42 Dizziness and giddiness: Secondary | ICD-10-CM | POA: Diagnosis not present

## 2021-04-18 DIAGNOSIS — I10 Essential (primary) hypertension: Secondary | ICD-10-CM

## 2021-04-18 LAB — BASIC METABOLIC PANEL
BUN: 9 mg/dL (ref 6–23)
CO2: 29 mEq/L (ref 19–32)
Calcium: 9 mg/dL (ref 8.4–10.5)
Chloride: 101 mEq/L (ref 96–112)
Creatinine, Ser: 0.87 mg/dL (ref 0.40–1.20)
GFR: 78.4 mL/min (ref >60.00)
Glucose, Bld: 105 mg/dL — ABNORMAL HIGH (ref 70–99)
Potassium: 3.5 mEq/L (ref 3.5–5.1)
Sodium: 141 mEq/L (ref 135–145)

## 2021-04-18 LAB — CBC
HCT: 36.1 % (ref 36.0–46.0)
Hemoglobin: 12.1 g/dL (ref 12.0–15.0)
MCHC: 33.5 g/dL (ref 30.0–36.0)
MCV: 91.5 fl (ref 78.0–100.0)
Platelets: 189 10*3/uL (ref 150.0–400.0)
RBC: 3.94 Mil/uL (ref 3.87–5.11)
RDW: 14.6 % (ref 11.5–15.5)
WBC: 7.1 10*3/uL (ref 4.0–10.5)

## 2021-04-18 LAB — TSH: TSH: 0.67 u[IU]/mL (ref 0.35–5.50)

## 2021-04-18 MED ORDER — MECLIZINE HCL 12.5 MG PO TABS: 12.5000 mg | ORAL_TABLET | Freq: Three times a day (TID) | ORAL | 0 refills | Status: DC | PRN

## 2021-04-18 NOTE — Assessment & Plan Note (Signed)
Mild tachycardia during exam today.  Patient does have history of palpitations but states she has not had any as of late.  She has been established with cardiology.  Did ask to make sure she was not taking any type of decongestants, caffeine, cocaine, methamphetamine, amphetamine.  Patient denied using any of the aforementioned.  We will check patient's TSH today in office.  Pending results

## 2021-04-18 NOTE — Assessment & Plan Note (Signed)
Patient does not check her blood pressure at home.  Patient states she may have a blood pressure cuff but not sure where it sat.  Encouraged patient strongly to pick up a blood pressure cuff today that when she can monitor blood pressures at home she was instructed to check her blood pressure once a day and if she has her episodes of dizziness.  She supposed to record these and send them to me via MyChart after a weeks worth of readings.  Blood pressure elevated in office today I did recheck with slight improvement.  She is to continue on the losartan 20 mg as prescribed.  Pending blood pressure log may change regimen.

## 2021-04-18 NOTE — Assessment & Plan Note (Signed)
States has been going on for months.  But worse over the last 1 to 2 weeks.  States her spouse were looking at appliances as they are building a house and she had 4 episodes within an hour or 1 causing her to stumble and fall.  No syncopal episode this time.  Patient states approximately 3 months ago she did have a syncopal episode at work and was taken to the [facility] emergency department.  Patient's neuro exam is intact given longstanding symptoms and correlation of notes in chart for like is related to her hypertension.  Discussed this with the patient.  Will write meclizine to see if this is of any benefit to the patient.  Pending at home blood pressure log for further management.

## 2021-04-18 NOTE — Patient Instructions (Signed)
I think the dizziness is related to your blood pressure.  Continue taking the blood pressure medications Check BP ONCE daily and when we get dizzy. Send me a log on My Chart

## 2021-04-18 NOTE — Progress Notes (Signed)
Acute Office Visit  Subjective:    Patient ID: Kelly Walton, female    DOB: 11/17/1970, 50 y.o.   MRN: [id number]  Chief Complaint  Patient presents with   Dizziness    Getting worse. Feels like she is moving and environment is moving when these episodes happen. Gets shooting pain in both temple areas before vertigo happens, lasts for a few seconds. X few months off and on but the last 1 to 2 weeks has gotten worse. She fell at work on the steps with an episode like this and at that time her b/p dropped.     Patient is in today for  Dizziness: States that it has been happening for a couple months. Over the past 2 weeks has increased. Went Monday to look at appliances and got dizzy 4 times with in the hour. Headache , no n/v. Has tried dramimmine, unsure if it helps or not.  Htn: Take medicine at night and does not check her blood pressure at home. No palptations lately, no syncope minus the episode back in may.  Right rib pain: No shortness of breath. Deep inspiration makes pain worse and touching it does too. States that she has tried tylenol with some relief. Offered to get xray today patient would like to hold off. Past Medical History:  Diagnosis Date   AC separation, right, initial encounter    Acute conjunctivitis of left eye 12/27/2020   Acute right lower quadrant pain 06/10/2018   Alcohol abuse    Asthma    Hypertension    Insomnia    Pain of upper abdomen 06/21/2017   Palpitations    a. 05/2019 Echo: EF 60-65%, diast dysfxn; b. 05/2019 Zio: Sinus rhythm-sinus tach. Avg HR 105. 6 SVT episodes - longest 7 beats. Rare PVCs-->Diltiazem added.   Periumbilical abdominal pain 03/12/2020   Spongiotic dermatitis     Past Surgical History:  Procedure Laterality Date   CESAREAN SECTION     FRACTURE SURGERY     ORIF CLAVICULAR FRACTURE Right 06/17/2020   Procedure: OPEN REDUCTION INTERNAL FIXATION (ORIF) RIGHT CLAVICLE FRACTURE, ACROMIOCLAVICULAR SEPARATION;  Surgeon: Tarry KosXu, Naiping  M, MD;  Location: [facility];  Service: Orthopedics;  Laterality: Right;   ORIF CLAVICULAR FRACTURE Right 07/05/2020   Procedure: REVISION OPEN REDUCTION INTERNAL FIXATION (ORIF) RIGHT CLAVICLE FRACTURE, ACROMIOCLAVICULAR SEPARATION;  Surgeon: Tarry KosXu, Naiping M, MD;  Location: [facility];  Service: Orthopedics;  Laterality: Right;   ROBOTIC ASSISTED TOTAL HYSTERECTOMY N/A 03/05/2014   Procedure: ROBOTIC ASSISTED TOTAL HYSTERECTOMY/BILATERAL SALPINGECTOMY;  Surgeon: Serita KyleSheronette A Cousins, MD;  Location: [facility];  Service: Gynecology;  Laterality: N/A;   TUBAL LIGATION      Family History  Problem Relation Age of Onset   Arthritis Mother    Arthritis Father    Lung cancer Father    Heart attack Father    Hyperlipidemia Maternal Grandmother    Stroke Maternal Grandmother    Hypertension Maternal Grandmother    Heart attack Maternal Grandmother    Alcohol abuse Paternal Grandmother    Arthritis Paternal Grandmother    Mental illness Paternal Grandmother    Breast cancer Maternal Aunt    Epilepsy Brother     Social History   Socioeconomic History   Marital status: Single    Spouse name: Not on file   Number of children: Not on file   Years of education: Not on file   Highest education level: Not on file  Occupational History   Not on  file  Tobacco Use   Smoking status: Never   Smokeless tobacco: Never  Vaping Use   Vaping Use: Never used  Substance and Sexual Activity   Alcohol use: Yes    Comment: occasion   Drug use: No   Sexual activity: Not on file  Other Topics Concern   Not on file  Social History Narrative   Single.   2 children, 1 step-son.   Manager at [employer].   Enjoys going to [employer], spending time with family.    Social Determinants of Health   Financial Resource Strain: Not on file  Food Insecurity: Not on file  Transportation Needs: Not on file  Physical Activity: Not on file  Stress: Not on file  Social Connections: Not on file  Intimate Partner  Violence: Not on file    Outpatient Medications Prior to Visit  Medication Sig Dispense Refill   albuterol (VENTOLIN HFA) 108 (90 Base) MCG/ACT inhaler TAKE 2 PUFFS BY MOUTH EVERY 6 HOURS AS NEEDED FOR WHEEZE OR SHORTNESS OF BREATH 6.7 each 0   fluticasone (FLOVENT HFA) 110 MCG/ACT inhaler Inhale 1 puff into the lungs in the morning and at bedtime. Rinse mouth after each use. 1 each 2   hydrOXYzine (ATARAX/VISTARIL) 10 MG tablet TAKE 1 TABLET (10 MG TOTAL) BY MOUTH AT BEDTIME AS NEEDED FOR ANXIETY. 90 tablet 0   montelukast (SINGULAIR) 10 MG tablet TAKE 1 TABLET (10 MG TOTAL) BY MOUTH AT BEDTIME. FOR ALLERGIES/ASTHMA. 90 tablet 3   olmesartan (BENICAR) 20 MG tablet Take 1 tablet (20 mg total) by mouth daily. For blood pressure. 90 tablet 0   sertraline (ZOLOFT) 50 MG tablet TAKE 1 TABLET (50 MG TOTAL) BY MOUTH AT BEDTIME. FOR ANXIETY. 90 tablet 3   tiZANidine (ZANAFLEX) 2 MG tablet TAKE 1-2 TABLETS (2-4 MG TOTAL) BY MOUTH EVERY 6 (SIX) HOURS AS NEEDED FOR MUSCLE SPASMS. 720 tablet 2   traMADol (ULTRAM) 50 MG tablet Take 1 tablet (50 mg total) by mouth daily as needed. 30 tablet 3   SUMAtriptan (IMITREX) 50 MG tablet Take 1 tablet by mouth at migraine onset. May repeat in 2 hours if headache persists or recurs. 10 tablet 0   No facility-administered medications prior to visit.    Allergies  Allergen Reactions   Promethazine Hcl Hives and Rash   Sulfa Antibiotics Hives and Rash    Pt states sent her to the hospital.   Trazodone And Nefazodone     Hallucinations     Review of Systems  Constitutional:  Negative for chills, fatigue and fever.  HENT:  Positive for ear pain.   Respiratory:  Negative for shortness of breath and wheezing.   Cardiovascular:  Positive for chest pain.  Neurological:  Positive for dizziness, light-headedness and headaches. Negative for syncope.      Objective:    Physical Exam Vitals and nursing note reviewed.  Constitutional:      Appearance: Normal  appearance. She is normal weight.  HENT:     Right Ear: Tympanic membrane, ear canal and external ear normal. There is no impacted cerumen.     Left Ear: Tympanic membrane, ear canal and external ear normal. There is no impacted cerumen.     Mouth/Throat:     Mouth: Mucous membranes are moist.     Pharynx: Oropharynx is clear. No posterior oropharyngeal erythema.     Comments: Cobblestoning  Eyes:     Extraocular Movements: Extraocular movements intact.     Pupils: Pupils are equal,  round, and reactive to light.  Neck:     Vascular: No carotid bruit.  Cardiovascular:     Rate and Rhythm: Regular rhythm. Tachycardia present.     Pulses: Normal pulses.     Heart sounds: No murmur heard. Pulmonary:     Effort: Pulmonary effort is normal.     Breath sounds: Normal breath sounds.  Abdominal:     General: Bowel sounds are normal.  Musculoskeletal:     Right lower leg: No edema.     Left lower leg: No edema.  Lymphadenopathy:     Cervical: No cervical adenopathy.  Skin:    General: Skin is warm.  Neurological:     General: No focal deficit present.     Mental Status: She is alert and oriented to person, place, and time.     Coordination: Coordination normal. Finger-Nose-Finger Test normal.     Gait: Gait normal.     Deep Tendon Reflexes: Reflexes normal.     Reflex Scores:      Bicep reflexes are 2+ on the right side and 2+ on the left side.      Patellar reflexes are 3+ on the right side and 3+ on the left side. Psychiatric:        Mood and Affect: Mood normal.        Behavior: Behavior normal.        Thought Content: Thought content normal.        Judgment: Judgment normal.    BP (!) 178/106   Pulse (!) 111   Temp 98.5 F (36.9 C)   Resp 12   Ht 5\' 4"  (1.626 m)   Wt 118 lb 4 oz (53.6 kg)   LMP 03/05/2014 Comment: LMP x 7yrs ago   SpO2 99%   BMI 20.30 kg/m  Wt Readings from Last 3 Encounters:  04/18/21 118 lb 4 oz (53.6 kg)  02/04/21 121 lb (54.9 kg)  10/15/20  125 lb (56.7 kg)    Health Maintenance Due  Topic Date Due   Pneumococcal Vaccine 50-76 Years old (2 - PCV) 05/15/2016   COLONOSCOPY (Pts 45-63yrs Insurance coverage will need to be confirmed)  Never done   COVID-19 Vaccine (3 - Booster for Pfizer series) 05/21/2020   INFLUENZA VACCINE  04/07/2021    There are no preventive care reminders to display for this patient.   Lab Results  Component Value Date   TSH 0.637 05/11/2019   Lab Results  Component Value Date   WBC 7.9 02/04/2021   HGB 14.4 02/04/2021   HCT 43.5 02/04/2021   MCV 91.8 02/04/2021   PLT 210.0 02/04/2021   Lab Results  Component Value Date   NA 139 02/04/2021   K 4.6 02/04/2021   CO2 26 02/04/2021   GLUCOSE 96 02/04/2021   BUN 14 02/04/2021   CREATININE 1.00 02/04/2021   BILITOT 0.5 02/04/2021   ALKPHOS 80 02/04/2021   AST 25 02/04/2021   ALT 17 02/04/2021   PROT 7.5 02/04/2021   ALBUMIN 4.7 02/04/2021   CALCIUM 10.0 02/04/2021   ANIONGAP 11 07/05/2020   GFR 66.42 02/04/2021   Lab Results  Component Value Date   CHOL 318 (H) 02/04/2021   Lab Results  Component Value Date   HDL 121.10 02/04/2021   Lab Results  Component Value Date   LDLCALC 116 (H) 06/21/2017   Lab Results  Component Value Date   TRIG 251.0 (H) 02/04/2021   Lab Results  Component Value Date  CHOLHDL 3 02/04/2021   Lab Results  Component Value Date   HGBA1C 5.2 07/01/2018       Assessment & Plan:   Problem List Items Addressed This Visit       Cardiovascular and Mediastinum   Essential hypertension    Patient does not check her blood pressure at home.  Patient states she may have a blood pressure cuff but not sure where it sat.  Encouraged patient strongly to pick up a blood pressure cuff today that when she can monitor blood pressures at home she was instructed to check her blood pressure once a day and if she has her episodes of dizziness.  She supposed to record these and send them to me via MyChart after a  weeks worth of readings.  Blood pressure elevated in office today I did recheck with slight improvement.  She is to continue on the losartan 20 mg as prescribed.  Pending blood pressure log may change regimen.        Other   Dizziness - Primary    States has been going on for months.  But worse over the last 1 to 2 weeks.  States her spouse were looking at appliances as they are building a house and she had 4 episodes within an hour or 1 causing her to stumble and fall.  No syncopal episode this time.  Patient states approximately 3 months ago she did have a syncopal episode at work and was taken to the [facility] emergency department.  Patient's neuro exam is intact given longstanding symptoms and correlation of notes in chart for like is related to her hypertension.  Discussed this with the patient.  Will write meclizine to see if this is of any benefit to the patient.  Pending at home blood pressure log for further management.      Relevant Medications   meclizine (ANTIVERT) 12.5 MG tablet   Other Relevant Orders   CBC   TSH   Basic metabolic panel   Tachycardia    Mild tachycardia during exam today.  Patient does have history of palpitations but states she has not had any as of late.  She has been established with cardiology.  Did ask to make sure she was not taking any type of decongestants, caffeine, cocaine, methamphetamine, amphetamine.  Patient denied using any of the aforementioned.  We will check patient's TSH today in office.  Pending results      Relevant Orders   TSH     No orders of the defined types were placed in this encounter.  This visit occurred during the SARS-CoV-2 public health emergency.  Safety protocols were in place, including screening questions prior to the visit, additional usage of staff PPE, and extensive cleaning of exam room while observing appropriate contact time as indicated for disinfecting solutions.   Audria Nine, NP

## 2021-04-25 ENCOUNTER — Other Ambulatory Visit: Payer: Self-pay | Admitting: Primary Care

## 2021-04-25 DIAGNOSIS — F411 Generalized anxiety disorder: Secondary | ICD-10-CM

## 2021-04-25 NOTE — Telephone Encounter (Signed)
Up to date with appointments refill sent in

## 2021-04-29 ENCOUNTER — Other Ambulatory Visit: Payer: Self-pay | Admitting: Primary Care

## 2021-04-29 DIAGNOSIS — I10 Essential (primary) hypertension: Secondary | ICD-10-CM

## 2021-05-01 ENCOUNTER — Telehealth: Payer: Self-pay | Admitting: Primary Care

## 2021-05-01 ENCOUNTER — Other Ambulatory Visit: Payer: Self-pay

## 2021-05-01 ENCOUNTER — Telehealth: Payer: Self-pay

## 2021-05-01 ENCOUNTER — Ambulatory Visit
Admission: EM | Admit: 2021-05-01 | Discharge: 2021-05-01 | Disposition: A | Discharge status: Home / Self Care | Payer: No Typology Code available for payment source | Attending: Family Medicine | Admitting: Family Medicine

## 2021-05-01 DIAGNOSIS — R0781 Pleurodynia: Secondary | ICD-10-CM

## 2021-05-01 MED ORDER — CYCLOBENZAPRINE HCL 10 MG PO TABS: 5.0000 mg | ORAL_TABLET | Freq: Three times a day (TID) | ORAL | 0 refills | Status: DC | PRN

## 2021-05-01 MED ORDER — DEXAMETHASONE SODIUM PHOSPHATE 10 MG/ML IJ SOLN
10.0000 mg | Freq: Once | INTRAMUSCULAR | Status: AC
  Administered 2021-05-01: 10 mg via INTRAMUSCULAR

## 2021-05-01 NOTE — Telephone Encounter (Signed)
I spoke with pt and on 04/29/21 pt started with dull constant left sided CP. Pain level now is 10. Pt said she could not leave work until 3 PM when someone comes to relieve pt. The CP per pt does not radiate to neck,shoulder, arm or back. Pt has SOB on and off but no cough and no fever. Pt having body chills, H/A, watery diarrhea since 04/29/21 and last diarrhea was this morning; last vomited on 04/29/21. Pt also has runny nose. Pt lives in [place] and works on [employer] in [place].pt said when she leaves work she will go to [employer] on [address]. UC & ED precautions given and pt voiced understanding.sending note to Allayne Gitelman NP and Park Meo CMA and Teams to [place] also.

## 2021-05-01 NOTE — Telephone Encounter (Signed)
She called [facility] and they told her that she would need a referral for the mammogram due to the pain.

## 2021-05-01 NOTE — ED Triage Notes (Signed)
Pt c/o pain under lt breast x2 days. States pain on any movement. Denies known injury. States taking OTC meds with no relief.

## 2021-05-01 NOTE — Telephone Encounter (Signed)
Noted, it looks like she's at urgent care with rib pain. Notes reviewed, diagnosed with muscle strain and treated.

## 2021-05-01 NOTE — ED Provider Notes (Signed)
[facility]    CSN: [id number] Arrival date & time: 05/01/21  1528      History   Chief Complaint Chief Complaint  Patient presents with   Breast Pain    HPI Kelly Walton is a 50 y.o. female.   Presenting today with 2 day hx of left upper anterior rib pain. States movement, deep breaths, pressure to the area, lifting heavy objects all make the pain significantly worse. Does not recall any specific injuries and no CP, SOB, palpitations, rashes, fever, chills, cough. Taking OTC pain relievers without much relief. No past similar issues.    Past Medical History:  Diagnosis Date   AC separation, right, initial encounter    Acute conjunctivitis of left eye 12/27/2020   Acute right lower quadrant pain 06/10/2018   Alcohol abuse    Asthma    Hypertension    Insomnia    Pain of upper abdomen 06/21/2017   Palpitations    a. 05/2019 Echo: EF 60-65%, diast dysfxn; b. 05/2019 Zio: Sinus rhythm-sinus tach. Avg HR 105. 6 SVT episodes - longest 7 beats. Rare PVCs-->Diltiazem added.   Periumbilical abdominal pain 03/12/2020   Spongiotic dermatitis     Patient Active Problem List   Diagnosis Date Noted   Dizziness 04/18/2021   Tachycardia 04/18/2021   Acute tension headache 10/15/2020   History of fracture of clavicle 06/11/2020   Dyspareunia, female 04/24/2020   Diarrhea 03/12/2020   Toe pain, chronic, left 02/28/2020   GAD (generalized anxiety disorder) 01/02/2020   Alcohol abuse 01/02/2020   Hand injury, right, initial encounter 09/26/2019   Palpitations 04/03/2019   Seasonal allergies 01/16/2019   Elevated LFTs 11/15/2018   Spongiotic dermatitis 07/08/2018   Hyperlipidemia 07/08/2018   Asthma 08/20/2017   Preventative health care 06/21/2017   Insomnia 04/16/2017   Essential hypertension 04/16/2017    Past Surgical History:  Procedure Laterality Date   CESAREAN SECTION     FRACTURE SURGERY     ORIF CLAVICULAR FRACTURE Right 06/17/2020   Procedure: OPEN  REDUCTION INTERNAL FIXATION (ORIF) RIGHT CLAVICLE FRACTURE, ACROMIOCLAVICULAR SEPARATION;  Surgeon: Tarry Kos, MD;  Location: [facility];  Service: Orthopedics;  Laterality: Right;   ORIF CLAVICULAR FRACTURE Right 07/05/2020   Procedure: REVISION OPEN REDUCTION INTERNAL FIXATION (ORIF) RIGHT CLAVICLE FRACTURE, ACROMIOCLAVICULAR SEPARATION;  Surgeon: Tarry Kos, MD;  Location: [facility];  Service: Orthopedics;  Laterality: Right;   ROBOTIC ASSISTED TOTAL HYSTERECTOMY N/A 03/05/2014   Procedure: ROBOTIC ASSISTED TOTAL HYSTERECTOMY/BILATERAL SALPINGECTOMY;  Surgeon: Serita Kyle, MD;  Location: [facility];  Service: Gynecology;  Laterality: N/A;   TUBAL LIGATION      OB History   No obstetric history on file.      Home Medications    Prior to Admission medications   Medication Sig Start Date End Date Taking? Authorizing Provider  cyclobenzaprine (FLEXERIL) 10 MG tablet Take 0.5-1 tablets (5-10 mg total) by mouth 3 (three) times daily as needed for muscle spasms. Do not drink alcohol or drive while taking this medication, may cause drowsiness 05/01/21  Yes Particia Nearing, PA-C  albuterol (VENTOLIN HFA) 108 (90 Base) MCG/ACT inhaler TAKE 2 PUFFS BY MOUTH EVERY 6 HOURS AS NEEDED FOR WHEEZE OR SHORTNESS OF BREATH 07/15/20   Doreene Nest, NP  fluticasone (FLOVENT HFA) 110 MCG/ACT inhaler Inhale 1 puff into the lungs in the morning and at bedtime. Rinse mouth after each use. 08/12/20   Doreene Nest, NP  hydrOXYzine (ATARAX/VISTARIL) 10 MG  tablet TAKE 1 TABLET (10 MG TOTAL) BY MOUTH AT BEDTIME AS NEEDED FOR ANXIETY. 04/25/21   Doreene Nest, NP  meclizine (ANTIVERT) 12.5 MG tablet Take 1 tablet (12.5 mg total) by mouth 3 (three) times daily as needed for dizziness. 04/18/21   Eden Emms, NP  montelukast (SINGULAIR) 10 MG tablet TAKE 1 TABLET (10 MG TOTAL) BY MOUTH AT BEDTIME. FOR ALLERGIES/ASTHMA. 02/28/21   Doreene Nest, NP  olmesartan (BENICAR) 20 MG  tablet TAKE 1 TABLET (20 MG TOTAL) BY MOUTH DAILY. FOR BLOOD PRESSURE. 04/29/21   Doreene Nest, NP  sertraline (ZOLOFT) 50 MG tablet TAKE 1 TABLET (50 MG TOTAL) BY MOUTH AT BEDTIME. FOR ANXIETY. 03/18/21   Doreene Nest, NP  tiZANidine (ZANAFLEX) 2 MG tablet TAKE 1-2 TABLETS (2-4 MG TOTAL) BY MOUTH EVERY 6 (SIX) HOURS AS NEEDED FOR MUSCLE SPASMS. 04/14/21   Hilts, Casimiro Needle, MD  traMADol (ULTRAM) 50 MG tablet Take 1 tablet (50 mg total) by mouth daily as needed. 01/23/21   Cristie Hem, PA-C    Family History Family History  Problem Relation Age of Onset   Arthritis Mother    Arthritis Father    Lung cancer Father    Heart attack Father    Hyperlipidemia Maternal Grandmother    Stroke Maternal Grandmother    Hypertension Maternal Grandmother    Heart attack Maternal Grandmother    Alcohol abuse Paternal Grandmother    Arthritis Paternal Grandmother    Mental illness Paternal Grandmother    Breast cancer Maternal Aunt    Epilepsy Brother     Social History Social History   Tobacco Use   Smoking status: Never   Smokeless tobacco: Never  Vaping Use   Vaping Use: Never used  Substance Use Topics   Alcohol use: Yes    Comment: occasion   Drug use: No     Allergies   Promethazine hcl, Sulfa antibiotics, and Trazodone and nefazodone   Review of Systems Review of Systems PER HPI   Physical Exam Triage Vital Signs ED Triage Vitals [05/01/21 1537]  Enc Vitals Group     BP (!) 163/104     Pulse Rate (!) 105     Resp 18     Temp 98.4 F (36.9 C)     Temp Source Oral     SpO2 96 %     Weight      Height      Head Circumference      Peak Flow      Pain Score 10     Pain Loc      Pain Edu?      Excl. in GC?    No data found.  Updated Vital Signs BP (!) 163/104 (BP Location: Left Arm)   Pulse (!) 105   Temp 98.4 F (36.9 C) (Oral)   Resp 18   LMP 03/05/2014 Comment: LMP x 9yrs ago   SpO2 96%   Visual Acuity Right Eye Distance:   Left Eye  Distance:   Bilateral Distance:    Right Eye Near:   Left Eye Near:    Bilateral Near:     Physical Exam Vitals and nursing note reviewed.  Constitutional:      Appearance: Normal appearance. She is not ill-appearing.  HENT:     Head: Atraumatic.  Eyes:     Extraocular Movements: Extraocular movements intact.     Conjunctiva/sclera: Conjunctivae normal.  Cardiovascular:     Rate and Rhythm:  Normal rate and regular rhythm.     Heart sounds: Normal heart sounds.  Pulmonary:     Effort: Pulmonary effort is normal. No respiratory distress.     Breath sounds: Normal breath sounds. No wheezing or rales.  Abdominal:     General: Bowel sounds are normal. There is no distension.     Palpations: Abdomen is soft.     Tenderness: There is no abdominal tenderness.  Musculoskeletal:        General: Tenderness present. No swelling or signs of injury. Normal range of motion.     Cervical back: Normal range of motion and neck supple.     Comments: Significantly ttp left anterior upper ribs just below breast tissue. No bony deformity palpable, no edema or skin changes  Skin:    General: Skin is warm and dry.     Findings: No bruising, erythema or rash.  Neurological:     Mental Status: She is alert and oriented to person, place, and time.  Psychiatric:        Mood and Affect: Mood normal.        Thought Content: Thought content normal.        Judgment: Judgment normal.     UC Treatments / Results  Labs (all labs ordered are listed, but only abnormal results are displayed) Labs Reviewed - No data to display  EKG   Radiology No results found.  Procedures Procedures (including critical care time)  Medications Ordered in UC Medications  dexamethasone (DECADRON) injection 10 mg (has no administration in time range)    Initial Impression / Assessment and Plan / UC Course  I have reviewed the triage vital signs and the nursing notes.  Pertinent labs & imaging results that were  available during my care of the patient were reviewed by me and considered in my medical decision making (see chart for details).     Suspect rib muscle strain, no traumatic injury or CP, SOB so will defer imaging, EKG with shared decision making. Tx with IM decadron, flexeril and rest. Heat and other supportive home care reviewed. Return for acutely worsening sxs.   Final Clinical Impressions(s) / UC Diagnoses   Final diagnoses:  Rib pain on left side   Discharge Instructions   None    ED Prescriptions     Medication Sig Dispense Auth. Provider   cyclobenzaprine (FLEXERIL) 10 MG tablet Take 0.5-1 tablets (5-10 mg total) by mouth 3 (three) times daily as needed for muscle spasms. Do not drink alcohol or drive while taking this medication, may cause drowsiness 30 tablet Particia Nearing, [place]      PDMP not reviewed this encounter.   Particia Nearing, [place] 05/01/21 [phone]

## 2021-05-01 NOTE — Telephone Encounter (Signed)
Kelly Walton called in wanted to know about getting a referral for a mammogram due to she is having pain in the left breast and the pain started yesterday. And when she called the  mammogram facility they told her to get a referral due to the pain

## 2021-05-01 NOTE — Telephone Encounter (Signed)
[facility] Day - Client TELEPHONE ADVICE RECORD [facility] Patient Name: Kelly Walton Gender: Female DOB: 1971/08/11 Age: 50 Y 1 M 28 D Return Phone Number: [phone] (Primary), [phone] (Secondary) Address: City/ State/ Zip: [place] [address] Client [facility] Day - Client Client Site [facility] - Day Physician Vernona Rieger - NP Contact Type Call Who Is Calling Patient / Member / Family / Caregiver Call Type Triage / Clinical Relationship To Patient Self Return Phone Number [phone] (Secondary) Chief Complaint Breast Symptoms Reason for Call Symptomatic / Request for Health Information Initial Comment Caller states that she has left breast pain. Translation No Nurse Assessment Nurse: Self, RN, Herbert Seta Date/Time (Eastern Time): 05/01/2021 10:30:16 AM Confirm and document reason for call. If symptomatic, describe symptoms. ---Caller has severe Breast pain , no fever. Not breastfeeding. Does the patient have any new or worsening symptoms? ---Yes Will a triage be completed? ---Yes Related visit to physician within the last 2 weeks? ---No Does the PT have any chronic conditions? (i.e. diabetes, asthma, this includes High risk factors for pregnancy, etc.) ---Yes List chronic conditions. ---HTN Is the patient pregnant or possibly pregnant? (Ask all females between the ages of 33-55) ---No Is this a behavioral health or substance abuse call? ---No Guidelines Guideline Title Affirmed Question Affirmed Notes Nurse Date/Time (Eastern Time) Breast Symptoms [1] Breast looks infected (spreading redness, feels hot or painful to touch) AND [2] no fever Self, RN, Herbert Seta 05/01/2021 10:31:11 AM Disp. Time Lamount Cohen Time) Disposition Final User 05/01/2021 10:15:58 AM Attempt made - message left Self, RN, Herbert Seta 05/01/2021 10:32:34 AM See PCP within 24 Hours Yes Self, RN,  Herbert Seta PLEASE NOTE: All timestamps contained within this report are represented as [place] Standard Time. CONFIDENTIALTY NOTICE: This fax transmission is intended only for the addressee. It contains information that is legally privileged, confidential or otherwise protected from use or disclosure. If you are not the intended recipient, you are strictly prohibited from reviewing, disclosing, copying using or disseminating any of this information or taking any action in reliance on or regarding this information. If you have received this fax in error, please notify us immediately by telephone so that we can arrange for its return to [place]. Phone: [phone], Toll-Free: [phone], Fax: [phone] Page: 2 of 2 Call Id: [id number] Caller Disagree/Comply Comply Caller Understands Yes PreDisposition Call Doctor Care Advice Given Per Guideline SEE PCP WITHIN 24 HOURS: * IF OFFICE WILL BE OPEN: You need to be examined within the next 24 hours. Call your doctor (or NP/PA) when the office opens and make an appointment. LOCAL HEAT: * Apply a warm washcloth for 10 minutes three times a day. PAIN MEDICINES: * For pain relief, you can take either acetaminophen, ibuprofen, or naproxen. CALL BACK IF: * Fever occurs * You become worse CARE ADVICE given per Breast Symptoms (Adult) guideline. Referrals REFERRED TO PCP OFFICE

## 2021-05-05 ENCOUNTER — Encounter: Payer: Self-pay | Admitting: Family Medicine

## 2021-05-05 ENCOUNTER — Ambulatory Visit: Payer: No Typology Code available for payment source | Admitting: Family Medicine

## 2021-06-30 ENCOUNTER — Other Ambulatory Visit: Payer: Self-pay | Admitting: Primary Care

## 2021-06-30 DIAGNOSIS — J4520 Mild intermittent asthma, uncomplicated: Secondary | ICD-10-CM

## 2021-07-06 ENCOUNTER — Other Ambulatory Visit: Payer: Self-pay | Admitting: Primary Care

## 2021-07-06 DIAGNOSIS — F411 Generalized anxiety disorder: Secondary | ICD-10-CM

## 2021-07-15 ENCOUNTER — Ambulatory Visit (INDEPENDENT_AMBULATORY_CARE_PROVIDER_SITE_OTHER): Payer: No Typology Code available for payment source

## 2021-07-15 ENCOUNTER — Other Ambulatory Visit: Payer: Self-pay

## 2021-07-15 ENCOUNTER — Encounter: Payer: Self-pay | Admitting: Orthopaedic Surgery

## 2021-07-15 ENCOUNTER — Other Ambulatory Visit: Payer: Self-pay | Admitting: Physician Assistant

## 2021-07-15 ENCOUNTER — Ambulatory Visit (INDEPENDENT_AMBULATORY_CARE_PROVIDER_SITE_OTHER): Payer: No Typology Code available for payment source | Admitting: Orthopaedic Surgery

## 2021-07-15 DIAGNOSIS — M898X1 Other specified disorders of bone, shoulder: Secondary | ICD-10-CM

## 2021-07-15 DIAGNOSIS — R0781 Pleurodynia: Secondary | ICD-10-CM

## 2021-07-15 MED ORDER — HYDROCODONE-ACETAMINOPHEN 5-325 MG PO TABS: 1.0000 | ORAL_TABLET | Freq: Two times a day (BID) | ORAL | 0 refills | Status: DC | PRN

## 2021-07-15 NOTE — Progress Notes (Signed)
Office Visit Note   Patient: Kelly Walton           Date of Birth: [birth year]           MRN: [id number] Visit Date: 07/15/2021              Requested by: Doreene Nest, NP [address] [place],  [place] [address] PCP: Doreene Nest, NP   Assessment & Plan: Visit Diagnoses:  1. Pain of right clavicle   2. Rib pain on left side     Plan: Impression is left rib and right clavicle contusions following recent injury.  She is fairly asymptomatic in regards to the clavicle.  In regards to the left sided ribs specifically around T6, we will treat this symptomatically.  She does not have any shortness of breath to indicate pneumothorax.  I have recommended NSAIDs, rest, ice and heat.  She would like a prescription of something stronger than tramadol, have agreed to call in 1 small prescription of Norco.  She will follow-up with [place] as needed.  Follow-Up Instructions: Return if symptoms worsen or fail to improve.   Orders:  Orders Placed This Encounter  Procedures   XR Ribs Unilateral Left   XR Clavicle Right   No orders of the defined types were placed in this encounter.     Procedures: No procedures performed   Clinical Data: No additional findings.   Subjective: Chief Complaint  Patient presents with   Right Shoulder - Pain    CLAVICLE    RIB PAIN    HPI patient is a 50 year old female who comes in today with left-sided rib pain and right clavicle pain.  Approximately 4 days ago, she fell backwards going down a set of stairs landing on her back and right side.  She has had pain to the left ribs as well as the right clavicle since.  The pain on the left is worse with coughing.  She has been taking NSAIDs or Tylenol without relief of symptoms.  She denies any shortness of breath or chest pain.  Of note, she is status post ORIF right clavicle fracture 06/17/2020 with subsequent revision 07/05/2020.  Doing well until this recent event.  Review of Systems as  detailed in HPI.  All others reviewed and are negative.   Objective: Vital Signs: LMP 03/05/2014 Comment: LMP x 32yrs ago   Physical Exam well-developed well-nourished female in no acute distress.  Alert and oriented x3.  Ortho Exam right clavicle exam reveals very mild tenderness to the distal aspect.  Full and painless range of motion of the right shoulder.  Examination of her left back reveals ecchymosis around the T6 level with surrounding tenderness.  No spinous tenderness.  She is neurovascular intact distally.  Specialty Comments:  No specialty comments available.  Imaging: XR Clavicle Right  Result Date: 07/15/2021 Stable alignment of the hardware without acute fracture  XR Ribs Unilateral Left  Result Date: 07/15/2021 No acute or structural abnormalities    PMFS History: Patient Active Problem List   Diagnosis Date Noted   Dizziness 04/18/2021   Tachycardia 04/18/2021   Acute tension headache 10/15/2020   History of fracture of clavicle 06/11/2020   Dyspareunia, female 04/24/2020   Diarrhea 03/12/2020   Toe pain, chronic, left 02/28/2020   GAD (generalized anxiety disorder) 01/02/2020   Alcohol abuse 01/02/2020   Hand injury, right, initial encounter 09/26/2019   Palpitations 04/03/2019   Seasonal allergies 01/16/2019   Elevated LFTs  11/15/2018   Spongiotic dermatitis 07/08/2018   Hyperlipidemia 07/08/2018   Asthma 08/20/2017   Preventative health care 06/21/2017   Insomnia 04/16/2017   Essential hypertension 04/16/2017   Past Medical History:  Diagnosis Date   AC separation, right, initial encounter    Acute conjunctivitis of left eye 12/27/2020   Acute right lower quadrant pain 06/10/2018   Alcohol abuse    Asthma    Hypertension    Insomnia    Pain of upper abdomen 06/21/2017   Palpitations    a. 05/2019 Echo: EF 60-65%, diast dysfxn; b. 05/2019 Zio: Sinus rhythm-sinus tach. Avg HR 105. 6 SVT episodes - longest 7 beats. Rare PVCs-->Diltiazem added.    Periumbilical abdominal pain 03/12/2020   Spongiotic dermatitis     Family History  Problem Relation Age of Onset   Arthritis Mother    Arthritis Father    Lung cancer Father    Heart attack Father    Hyperlipidemia Maternal Grandmother    Stroke Maternal Grandmother    Hypertension Maternal Grandmother    Heart attack Maternal Grandmother    Alcohol abuse Paternal Grandmother    Arthritis Paternal Grandmother    Mental illness Paternal Grandmother    Breast cancer Maternal Aunt    Epilepsy Brother     Past Surgical History:  Procedure Laterality Date   CESAREAN SECTION     FRACTURE SURGERY     ORIF CLAVICULAR FRACTURE Right 06/17/2020   Procedure: OPEN REDUCTION INTERNAL FIXATION (ORIF) RIGHT CLAVICLE FRACTURE, ACROMIOCLAVICULAR SEPARATION;  Surgeon: Tarry Kos, MD;  Location: [facility];  Service: Orthopedics;  Laterality: Right;   ORIF CLAVICULAR FRACTURE Right 07/05/2020   Procedure: REVISION OPEN REDUCTION INTERNAL FIXATION (ORIF) RIGHT CLAVICLE FRACTURE, ACROMIOCLAVICULAR SEPARATION;  Surgeon: Tarry Kos, MD;  Location: [facility];  Service: Orthopedics;  Laterality: Right;   ROBOTIC ASSISTED TOTAL HYSTERECTOMY N/A 03/05/2014   Procedure: ROBOTIC ASSISTED TOTAL HYSTERECTOMY/BILATERAL SALPINGECTOMY;  Surgeon: Serita Kyle, MD;  Location: [facility];  Service: Gynecology;  Laterality: N/A;   TUBAL LIGATION     Social History   Occupational History   Not on file  Tobacco Use   Smoking status: Never   Smokeless tobacco: Never  Vaping Use   Vaping Use: Never used  Substance and Sexual Activity   Alcohol use: Yes    Comment: occasion   Drug use: No   Sexual activity: Not on file

## 2021-07-24 ENCOUNTER — Other Ambulatory Visit: Payer: Self-pay | Admitting: Primary Care

## 2021-07-24 DIAGNOSIS — F411 Generalized anxiety disorder: Secondary | ICD-10-CM

## 2021-07-29 ENCOUNTER — Other Ambulatory Visit: Payer: Self-pay | Admitting: Primary Care

## 2021-07-29 DIAGNOSIS — J4520 Mild intermittent asthma, uncomplicated: Secondary | ICD-10-CM

## 2021-08-02 ENCOUNTER — Other Ambulatory Visit: Payer: Self-pay | Admitting: Primary Care

## 2021-08-02 DIAGNOSIS — J4520 Mild intermittent asthma, uncomplicated: Secondary | ICD-10-CM

## 2021-08-12 ENCOUNTER — Other Ambulatory Visit: Payer: Self-pay | Admitting: Primary Care

## 2021-08-12 DIAGNOSIS — J4520 Mild intermittent asthma, uncomplicated: Secondary | ICD-10-CM

## 2021-08-18 ENCOUNTER — Other Ambulatory Visit: Payer: Self-pay | Admitting: Orthopaedic Surgery

## 2021-08-18 ENCOUNTER — Encounter: Payer: Self-pay | Admitting: Orthopaedic Surgery

## 2021-08-18 MED ORDER — HYDROCODONE-ACETAMINOPHEN 5-325 MG PO TABS: 1.0000 | ORAL_TABLET | Freq: Two times a day (BID) | ORAL | 0 refills | Status: DC | PRN

## 2021-08-18 NOTE — Telephone Encounter (Signed)
I sent norco

## 2021-09-10 ENCOUNTER — Other Ambulatory Visit: Payer: Self-pay | Admitting: Primary Care

## 2021-09-10 DIAGNOSIS — J4520 Mild intermittent asthma, uncomplicated: Secondary | ICD-10-CM

## 2021-09-25 ENCOUNTER — Other Ambulatory Visit: Payer: Self-pay | Admitting: Primary Care

## 2021-09-25 DIAGNOSIS — J4520 Mild intermittent asthma, uncomplicated: Secondary | ICD-10-CM

## 2021-09-26 NOTE — Telephone Encounter (Signed)
I spoke with pt; pt has had dizziness on and off for 5 months; last 2 wks pt having more episodes of dizziness, night sweats and pt has fallen x 2 in last 2 wks due to dizziness. Pt said the room does spin when has dizziness and there is no warning or pattern prior to dizzy episodes. Pt said also has some tingling for few seconds while dizzy. The dizzy episodes do not last but few seconds. Pt has not taken BP in couple of months.pt does not think having fast heart beat at this time. Pt said she cannot take meclizine during the day due to causes pt to be drowsy. Pt has been taking olmesartan 20 mg daily at night. Pt has H/A with pain level 2 - 3 on and off with last H/A being 09/20/21. No CP. Pt has some SOB with her asthma but after using inhaler SOB goes away. Pt said no unusual SOB. Pt last saw Audria Nine NP 04/18/21 with dizziness at that visit BP 162/90 P 111. Pt said she cannot ck her schedule at this time due to driving. I advised pt since had dizziness while driving (see  pt message ) that she should not drive until evaluated. Pt said she has to go to work; I advised to have someone else drive pt. No response from pt. I offered pt multiple appt that were not taken due to pts schedule. Pt only wants to see [name] NP. I advised pt I could schedule her an appt at [facility] after she gets off work. Pt said she will go to an UC when gets off work today and appt was scheduled with Allayne Gitelman NP 10/01/21 at 9 AM. Again UC & ED precautions given and pt voiced understanding and appreciative. Sending note to Allayne Gitelman NP and [facility] CMA.

## 2021-09-26 NOTE — Telephone Encounter (Signed)
Noted. Will evaluate. Agree with recommendations.

## 2021-10-01 ENCOUNTER — Ambulatory Visit: Payer: No Typology Code available for payment source | Admitting: Primary Care

## 2021-10-02 ENCOUNTER — Other Ambulatory Visit: Payer: Self-pay

## 2021-10-02 ENCOUNTER — Encounter: Payer: Self-pay | Admitting: Primary Care

## 2021-10-02 ENCOUNTER — Ambulatory Visit (INDEPENDENT_AMBULATORY_CARE_PROVIDER_SITE_OTHER): Payer: No Typology Code available for payment source | Admitting: Primary Care

## 2021-10-02 VITALS — BP 110/62 | HR 54 | Temp 98.6°F | Ht 64.0 in | Wt 116.0 lb

## 2021-10-02 DIAGNOSIS — R42 Dizziness and giddiness: Secondary | ICD-10-CM | POA: Diagnosis not present

## 2021-10-02 DIAGNOSIS — R519 Headache, unspecified: Secondary | ICD-10-CM

## 2021-10-02 DIAGNOSIS — R197 Diarrhea, unspecified: Secondary | ICD-10-CM

## 2021-10-02 DIAGNOSIS — R61 Generalized hyperhidrosis: Secondary | ICD-10-CM | POA: Insufficient documentation

## 2021-10-02 DIAGNOSIS — J4520 Mild intermittent asthma, uncomplicated: Secondary | ICD-10-CM

## 2021-10-02 MED ORDER — FLUTICASONE PROPIONATE HFA 110 MCG/ACT IN AERO: 1.0000 | INHALATION_SPRAY | Freq: Two times a day (BID) | RESPIRATORY_TRACT | 1 refills | Status: DC

## 2021-10-02 NOTE — Assessment & Plan Note (Addendum)
Question if she is undergoing menopause giving the remains of her 2 ovaries.  No alarm signs such as rectal bleeding, hemoptysis, unexplained weight loss.  Will be sending to GI for colonoscopy as she is due. Mammogram is overdue, we will update this.  Checking labs today.  Await results.

## 2021-10-02 NOTE — Assessment & Plan Note (Signed)
Symptoms suggest migraines.  Could be provoked from COVID 19 infection several months ago.  Will obtain CT head. I offered daily preventative treatment for headaches, she currently declines for now but will update if she changes her mind.  Await CT head.

## 2021-10-02 NOTE — Progress Notes (Signed)
Subjective:    Patient ID: Kelly Walton, female    DOB: [birth year], 51 y.o.   MRN: [id number]  HPI  Kelly Walton is a very pleasant 51 y.o. female with a history of hypertension, asthma, palpitations, GAD, alcohol abuse, diarrhea, dyspareunia, seasonal allergies who presents today to discuss multiple symptoms.  Symptoms include dizziness, headaches, vomiting, diarrhea, fatigue, night sweats, night terrors, tinnitus, and neck pain.   1) Dizziness: Her dizziness began about 5 months ago, intermittent, but more frequent episodes began two weeks ago. She describes her dizziness as a "room spinning" sensation. Episodes can occurring with rest and movement, lasts 30-60 seconds, head/room spinning sensation.   A few nights ago she got up from the bed, had a sudden onset of "my head spinning", could walk, and fell. This lasted for one minute.  No injuries from fall.  She's significantly reduced her alcohol consumption, typically has 1 drink per week, socially at dinner. She's taken Meclizine twice which helps but causes drowsiness.   She has a family history of vertigo in her mother. She remembers taking her mother to vestibular rehab.   2) Headaches: Headaches began months ago, just after having Covid-19 infection, typically start during the night (3 am) when she gets up to use the bathroom. Headaches occur daily, more severe 3-4 days weekly. Headaches last 1-2 days. Headaches are located behind her eyes, and bilateral temporal lobes. She develops photophobia, phonophobia, and nausea during headaches. She takes Excedrin migraine with occasional relief. She completes eye exams annually, scheduled for next month.   3) Asthma: Chronic, increased symptoms over the last 6 weeks with shortness of breath and chest tightness. She's using her Flovent inhaler once daily, every morning. She's using her albuterol 2-3 times weekly.   4) Night Sweats: Chronic for the last 3-4 months. Soaks the bed, has to  change linens, can smell her sweat. Her significant other has to sleep in another bed. She does have mild to moderate hot flashes during the day, occurs about once weekly.   She denies unexplained loss.Partial hysterectomy was several years ago, still has both ovaries.   5) Diarrhea: Chronic and intermittent for the last year. Over the last few months she's developed a sensitivity to smells which causes nausea. She will experience liquid diarrhea 2-3 times daily just after eating any food/meal. She does notice mild abdominal cramping with diarrhea. No pain outside of diarrhea.    She denies rectal bleeding, changes in her diet, new supplements, right upper quadrant abdominal pain, nausea/vomiting with diarrhea. She is eating out at restaurants more than usual, typically tries to make healthier choices. She's never undergone a colonoscopy.   BP Readings from Last 3 Encounters:  10/02/21 110/62  05/01/21 (!) 163/104  04/18/21 (!) 162/90        Review of Systems  Constitutional:  Positive for fatigue. Negative for fever and unexpected weight change.  Eyes:  Positive for photophobia. Negative for visual disturbance.  Respiratory:  Positive for chest tightness and shortness of breath. Negative for wheezing.   Cardiovascular:  Negative for chest pain.  Gastrointestinal:  Positive for diarrhea. Negative for vomiting.  Genitourinary:        Night sweats  Neurological:  Positive for dizziness and headaches.        Past Medical History:  Diagnosis Date   AC separation, right, initial encounter    Acute conjunctivitis of left eye 12/27/2020   Acute right lower quadrant pain 06/10/2018   Alcohol abuse  Asthma    Hypertension    Insomnia    Pain of upper abdomen 06/21/2017   Palpitations    a. 05/2019 Echo: EF 60-65%, diast dysfxn; b. 05/2019 Zio: Sinus rhythm-sinus tach. Avg HR 105. 6 SVT episodes - longest 7 beats. Rare PVCs-->Diltiazem added.   Periumbilical abdominal pain 03/12/2020    Spongiotic dermatitis     Social History   Socioeconomic History   Marital status: Single    Spouse name: Not on file   Number of children: Not on file   Years of education: Not on file   Highest education level: Not on file  Occupational History   Not on file  Tobacco Use   Smoking status: Never   Smokeless tobacco: Never  Vaping Use   Vaping Use: Never used  Substance and Sexual Activity   Alcohol use: Yes    Comment: occasion   Drug use: No   Sexual activity: Not on file  Other Topics Concern   Not on file  Social History Narrative   Single.   2 children, 1 step-son.   Manager at [employer].   Enjoys going to [employer], spending time with family.    Social Determinants of Health   Financial Resource Strain: Not on file  Food Insecurity: Not on file  Transportation Needs: Not on file  Physical Activity: Not on file  Stress: Not on file  Social Connections: Not on file  Intimate Partner Violence: Not on file    Past Surgical History:  Procedure Laterality Date   CESAREAN SECTION     FRACTURE SURGERY     ORIF CLAVICULAR FRACTURE Right 06/17/2020   Procedure: OPEN REDUCTION INTERNAL FIXATION (ORIF) RIGHT CLAVICLE FRACTURE, ACROMIOCLAVICULAR SEPARATION;  Surgeon: Tarry Kos, MD;  Location: [facility];  Service: Orthopedics;  Laterality: Right;   ORIF CLAVICULAR FRACTURE Right 07/05/2020   Procedure: REVISION OPEN REDUCTION INTERNAL FIXATION (ORIF) RIGHT CLAVICLE FRACTURE, ACROMIOCLAVICULAR SEPARATION;  Surgeon: Tarry Kos, MD;  Location: [facility];  Service: Orthopedics;  Laterality: Right;   ROBOTIC ASSISTED TOTAL HYSTERECTOMY N/A 03/05/2014   Procedure: ROBOTIC ASSISTED TOTAL HYSTERECTOMY/BILATERAL SALPINGECTOMY;  Surgeon: Serita Kyle, MD;  Location: [facility];  Service: Gynecology;  Laterality: N/A;   TUBAL LIGATION      Family History  Problem Relation Age of Onset   Arthritis Mother    Arthritis Father    Lung cancer Father    Heart attack  Father    Hyperlipidemia Maternal Grandmother    Stroke Maternal Grandmother    Hypertension Maternal Grandmother    Heart attack Maternal Grandmother    Alcohol abuse Paternal Grandmother    Arthritis Paternal Grandmother    Mental illness Paternal Grandmother    Breast cancer Maternal Aunt    Epilepsy Brother     Allergies  Allergen Reactions   Promethazine Hcl Hives and Rash   Sulfa Antibiotics Hives and Rash    Pt states sent her to the hospital.   Trazodone And Nefazodone     Hallucinations     Current Outpatient Medications on File Prior to Visit  Medication Sig Dispense Refill   albuterol (VENTOLIN HFA) 108 (90 Base) MCG/ACT inhaler TAKE 2 PUFFS BY MOUTH EVERY 6 HOURS AS NEEDED FOR WHEEZE OR SHORTNESS OF BREATH 6.7 each 0   fluticasone (FLOVENT HFA) 110 MCG/ACT inhaler Inhale 1 puff into the lungs in the morning and at bedtime. Rinse mouth after each use. 1 each 2   HYDROcodone-acetaminophen (NORCO) 5-325 MG tablet  Take 1 tablet by mouth 2 (two) times daily as needed. 14 tablet 0   hydrOXYzine (ATARAX/VISTARIL) 10 MG tablet TAKE 1 TABLET (10 MG TOTAL) BY MOUTH AT BEDTIME AS NEEDED FOR ANXIETY. 90 tablet 0   meclizine (ANTIVERT) 12.5 MG tablet Take 1 tablet (12.5 mg total) by mouth 3 (three) times daily as needed for dizziness. 30 tablet 0   montelukast (SINGULAIR) 10 MG tablet TAKE 1 TABLET (10 MG TOTAL) BY MOUTH AT BEDTIME. FOR ALLERGIES/ASTHMA. 90 tablet 3   olmesartan (BENICAR) 20 MG tablet TAKE 1 TABLET (20 MG TOTAL) BY MOUTH DAILY. FOR BLOOD PRESSURE. 90 tablet 2   sertraline (ZOLOFT) 50 MG tablet TAKE 1 TABLET (50 MG TOTAL) BY MOUTH AT BEDTIME. FOR ANXIETY. 90 tablet 3   tiZANidine (ZANAFLEX) 2 MG tablet TAKE 1-2 TABLETS (2-4 MG TOTAL) BY MOUTH EVERY 6 (SIX) HOURS AS NEEDED FOR MUSCLE SPASMS. 720 tablet 2   No current facility-administered medications on file prior to visit.    BP 110/62    Pulse (!) 54    Temp 98.6 F (37 C) (Temporal)    Ht 5\' 4"  (1.626 m)    Wt  116 lb (52.6 kg)    LMP 03/05/2014 Comment: LMP x 72yrs ago    SpO2 98%    BMI 19.91 kg/m  Objective:   Physical Exam HENT:     Right Ear: Tympanic membrane and ear canal normal.     Left Ear: Tympanic membrane and ear canal normal.  Eyes:     Extraocular Movements: Extraocular movements intact.  Cardiovascular:     Rate and Rhythm: Normal rate and regular rhythm.  Pulmonary:     Effort: Pulmonary effort is normal.     Breath sounds: Normal breath sounds. No wheezing.  Abdominal:     General: Bowel sounds are normal.     Palpations: Abdomen is soft.     Tenderness: There is no abdominal tenderness.  Skin:    General: Skin is warm and dry.  Neurological:     Mental Status: She is oriented to person, place, and time.     Cranial Nerves: No cranial nerve deficit.     Motor: No weakness.     Coordination: Coordination normal.     Gait: Gait normal.  Psychiatric:        Mood and Affect: Mood normal.          Assessment & Plan:      This visit occurred during the SARS-CoV-2 public health emergency.  Safety protocols were in place, including screening questions prior to the visit, additional usage of staff PPE, and extensive cleaning of exam room while observing appropriate contact time as indicated for disinfecting solutions.

## 2021-10-02 NOTE — Assessment & Plan Note (Signed)
Symptoms/HPI suggest vertigo, but need to rule out other causes.  Neuro exam today grossly unremarkable.  Will check CT head. Referral placed to ENT for further evaluation.

## 2021-10-02 NOTE — Patient Instructions (Signed)
Stop by the lab prior to leaving today. I will notify you of your results once received.   You will be contacted regarding your referral to ENT for dizziness, GI for diarrhea, and for the CT scan of your head.  Please let us know if you have not been contacted within two weeks.   Return the stool kit when able.  You may take meclizine as needed for dizziness.  Continue Flovent inhaler, Inhale 1 puff twice daily, everyday for asthma.  It was a pleasure to see you today!

## 2021-10-02 NOTE — Assessment & Plan Note (Addendum)
Unclear etiology.  Could be IBS.  No suspected gallbladder involvement, and she does not appear to have an infectious disease.  No rectal bleeding.  Will check stool studies to be sure. Labs pending today.  Referral placed to GI for further evaluation as she is also due for a colonoscopy.

## 2021-10-02 NOTE — Assessment & Plan Note (Signed)
More persistent recently.  Discussed to use Flovent inhaler twice daily, every day, rather than once daily.  Continue albuterol as needed.  No wheezing or distress noted on exam.

## 2021-10-03 LAB — COMPREHENSIVE METABOLIC PANEL
ALT: 14 U/L (ref 0–35)
AST: 21 U/L (ref 0–37)
Albumin: 3.9 g/dL (ref 3.5–5.2)
Alkaline Phosphatase: 76 U/L (ref 39–117)
BUN: 19 mg/dL (ref 6–23)
CO2: 27 mEq/L (ref 19–32)
Calcium: 8.6 mg/dL (ref 8.4–10.5)
Chloride: 102 mEq/L (ref 96–112)
Creatinine, Ser: 1.17 mg/dL (ref 0.40–1.20)
GFR: 54.76 mL/min — ABNORMAL LOW (ref >60.00)
Glucose, Bld: 96 mg/dL (ref 70–99)
Potassium: 4.7 mEq/L (ref 3.5–5.1)
Sodium: 135 mEq/L (ref 135–145)
Total Bilirubin: 0.3 mg/dL (ref 0.2–1.2)
Total Protein: 6.1 g/dL (ref 6.0–8.3)

## 2021-10-03 LAB — CBC WITH DIFFERENTIAL/PLATELET
Basophils Absolute: 0 10*3/uL (ref 0.0–0.1)
Basophils Relative: 1.1 % (ref 0.0–3.0)
Eosinophils Absolute: 0.2 10*3/uL (ref 0.0–0.7)
Eosinophils Relative: 4.3 % (ref 0.0–5.0)
HCT: 34 % — ABNORMAL LOW (ref 36.0–46.0)
Hemoglobin: 11.3 g/dL — ABNORMAL LOW (ref 12.0–15.0)
Lymphocytes Relative: 32.9 % (ref 12.0–46.0)
Lymphs Abs: 1.2 10*3/uL (ref 0.7–4.0)
MCHC: 33.2 g/dL (ref 30.0–36.0)
MCV: 94.5 fl (ref 78.0–100.0)
Monocytes Absolute: 0.2 10*3/uL (ref 0.1–1.0)
Monocytes Relative: 6.6 % (ref 3.0–12.0)
Neutro Abs: 2.1 10*3/uL (ref 1.4–7.7)
Neutrophils Relative %: 55.1 % (ref 43.0–77.0)
Platelets: 191 10*3/uL (ref 150.0–400.0)
RBC: 3.59 Mil/uL — ABNORMAL LOW (ref 3.87–5.11)
RDW: 15.2 % (ref 11.5–15.5)
WBC: 3.7 10*3/uL — ABNORMAL LOW (ref 4.0–10.5)

## 2021-10-03 LAB — LIPASE: Lipase: 31 U/L (ref 11.0–59.0)

## 2021-10-03 LAB — TSH: TSH: 1.19 u[IU]/mL (ref 0.35–5.50)

## 2021-10-08 ENCOUNTER — Other Ambulatory Visit: Payer: Self-pay

## 2021-10-08 ENCOUNTER — Other Ambulatory Visit: Payer: No Typology Code available for payment source

## 2021-10-08 ENCOUNTER — Other Ambulatory Visit: Payer: Self-pay | Admitting: Orthopaedic Surgery

## 2021-10-08 ENCOUNTER — Encounter: Payer: Self-pay | Admitting: *Deleted

## 2021-10-08 DIAGNOSIS — R197 Diarrhea, unspecified: Secondary | ICD-10-CM

## 2021-10-11 ENCOUNTER — Other Ambulatory Visit: Payer: Self-pay | Admitting: Primary Care

## 2021-10-11 DIAGNOSIS — F411 Generalized anxiety disorder: Secondary | ICD-10-CM

## 2021-10-13 LAB — GASTROINTESTINAL PATHOGEN PANEL PCR
C. difficile Tox A/B, PCR: NOT DETECTED
Campylobacter, PCR: NOT DETECTED
Cryptosporidium, PCR: NOT DETECTED
E coli (ETEC) LT/ST PCR: NOT DETECTED
E coli (STEC) stx1/stx2, PCR: NOT DETECTED
E coli 0157, PCR: NOT DETECTED
Giardia lamblia, PCR: NOT DETECTED
Norovirus, PCR: NOT DETECTED
Rotavirus A, PCR: NOT DETECTED
Salmonella, PCR: NOT DETECTED
Shigella, PCR: NOT DETECTED

## 2021-10-15 ENCOUNTER — Encounter: Payer: Self-pay | Admitting: *Deleted

## 2021-10-15 ENCOUNTER — Other Ambulatory Visit: Payer: Self-pay | Admitting: Orthopaedic Surgery

## 2021-10-16 ENCOUNTER — Encounter: Payer: Self-pay | Admitting: *Deleted

## 2021-10-21 ENCOUNTER — Ambulatory Visit (INDEPENDENT_AMBULATORY_CARE_PROVIDER_SITE_OTHER): Payer: No Typology Code available for payment source | Admitting: Nurse Practitioner

## 2021-10-21 ENCOUNTER — Encounter: Payer: Self-pay | Admitting: Nurse Practitioner

## 2021-10-21 ENCOUNTER — Other Ambulatory Visit (INDEPENDENT_AMBULATORY_CARE_PROVIDER_SITE_OTHER): Payer: No Typology Code available for payment source

## 2021-10-21 VITALS — BP 120/90 | HR 96 | Ht 63.5 in | Wt 109.1 lb

## 2021-10-21 DIAGNOSIS — R112 Nausea with vomiting, unspecified: Secondary | ICD-10-CM

## 2021-10-21 DIAGNOSIS — R634 Abnormal weight loss: Secondary | ICD-10-CM

## 2021-10-21 DIAGNOSIS — K529 Noninfective gastroenteritis and colitis, unspecified: Secondary | ICD-10-CM

## 2021-10-21 LAB — FERRITIN: Ferritin: 121.5 ng/mL (ref 10.0–291.0)

## 2021-10-21 MED ORDER — NA SULFATE-K SULFATE-MG SULF 17.5-3.13-1.6 GM/177ML PO SOLN: 1.0000 | ORAL | 0 refills | Status: DC

## 2021-10-21 MED ORDER — METOCLOPRAMIDE HCL 10 MG PO TABS: ORAL_TABLET | ORAL | 0 refills | Status: DC

## 2021-10-21 MED ORDER — ONDANSETRON HCL 4 MG PO TABS: 4.0000 mg | ORAL_TABLET | Freq: Three times a day (TID) | ORAL | 2 refills | Status: DC | PRN

## 2021-10-21 NOTE — Progress Notes (Signed)
ASSESSMENT AND PLAN    # 51 yo female referred for chronic diarrhea ( 2-3 months). Multiple watery BMs a day including nocturnal stools. GI path panel negative. Rule out IBD, celiac disease, microscopic colitis, functional diarrhea. Also, query a celiac sprue like enteropathy on Olmesartan which she has been taking for about two years.  --For further evaluation patient will be scheduled for colonoscopy. The risks and benefits of colonoscopy with possible polypectomy / biopsies were discussed and the patient agrees to proceed.  --Hopefully she will be able to tolerate the prep given recent nausea / vomiting. Will give her Zofran and also try Reglan 10 mg prior to each dose of bowel prep     # Nausea / vomiting x 2 weeks with associated weight loss of 7 pounds. No THC use. Rule out PUD, especially in setting of new anemia, see below.  --Further evaluation at time of EGD --Trial of zofran 4 mg Q 8 hours prn  # Mild [place] anemia, new. Hgb 11.3. No menstrual cycles in > 20 years. Doesn't donate blood. No overt bleeding. Rule out celiac, PUD, neoplasm, etc.  --obtain iron studies --further evaluation at time of EGD / colonoscopy    HISTORY OF PRESENT ILLNESS     Chief Complaint : nausea, vomiting, weight loss, diarrhea.   [place] A Oglesby is a 51 y.o. female with a past medical history significant for alcoholism, anxiety, arthritis, asthma, hypertension, hysterectomy. See PMH below for any additional history.   Patient referred by Alma Friendly, NP for diarrhea. She gives a three month history of watery diarrhea without blood several times a day. She has diarrhea after meals but also during the night.  She hasn' thad any antibiotics in last several months. No medication or diet changes . Her knowledge of family history is limited ( doesn't know much about Dad's history) but not IBD or celiac disease on Mother's side. She has been on Olmesartan for two years.  No new medications to which she can  attribute the diarrhea .  She does not take any over the counter meds / supplements. She does get minor cramps prior to defecation but no significant abdominal pain.  A new development, she has been having nausea and vomiting for a couple of weeks.  Even the smell of food causes nausea. She went from 116 to 109 pounds over the last two weeks . She has a new mild anemia. She doesn't take NSAIDs.    Data Reviewed:  10/08/21 GI path panel negative.   CBC Latest Ref Rng & Units 10/02/2021 04/18/2021 02/04/2021  WBC 4.0 - 10.5 K/uL 3.7(L) 7.1 7.9  Hemoglobin 12.0 - 15.0 g/dL 11.3(L) 12.1 14.4  Hematocrit 36.0 - 46.0 % 34.0(L) 36.1 43.5  Platelets 150.0 - 400.0 K/uL 191.0 189.0 210.0    Lab Results  Component Value Date   LIPASE 31.0 10/02/2021   CMP Latest Ref Rng & Units 10/02/2021 04/18/2021 02/04/2021  Glucose 70 - 99 mg/dL 96 105(H) 96  BUN 6 - 23 mg/dL 19 9 14   Creatinine 0.40 - 1.20 mg/dL 1.17 0.87 1.00  Sodium 135 - 145 mEq/L 135 141 139  Potassium 3.5 - 5.1 mEq/L 4.7 3.5 4.6  Chloride 96 - 112 mEq/L 102 101 101  CO2 19 - 32 mEq/L 27 29 26   Calcium 8.4 - 10.5 mg/dL 8.6 9.0 10.0  Total Protein 6.0 - 8.3 g/dL 6.1 - 7.5  Total Bilirubin 0.2 - 1.2 mg/dL 0.3 - 0.5  Alkaline  Phos 39 - 117 U/L 76 - 80  AST 0 - 37 U/L 21 - 25  ALT 0 - 35 U/L 14 - 17   PREVIOUS GI EVALUATIONS:   none  Past Medical History:  Diagnosis Date   AC separation, right, initial encounter    Acute conjunctivitis of left eye 12/27/2020   Acute right lower quadrant pain 06/10/2018   Alcohol abuse    Anxiety    Arthritis    Asthma    Hypertension    Insomnia    Pain of upper abdomen 06/21/2017   Palpitations    Periumbilical abdominal pain 03/12/2020   Spongiotic dermatitis      Past Surgical History:  Procedure Laterality Date   CESAREAN SECTION     FOOT FRACTURE SURGERY Right    ORIF CLAVICULAR FRACTURE Right 06/17/2020   Procedure: OPEN REDUCTION INTERNAL FIXATION (ORIF) RIGHT CLAVICLE FRACTURE,  ACROMIOCLAVICULAR SEPARATION;  Surgeon: Leandrew Koyanagi, MD;  Location: [facility];  Service: Orthopedics;  Laterality: Right;   ORIF CLAVICULAR FRACTURE Right 07/05/2020   Procedure: REVISION OPEN REDUCTION INTERNAL FIXATION (ORIF) RIGHT CLAVICLE FRACTURE, ACROMIOCLAVICULAR SEPARATION;  Surgeon: Leandrew Koyanagi, MD;  Location: [facility];  Service: Orthopedics;  Laterality: Right;   PERCUTANEOUS PINNING PHALANX FRACTURE OF HAND Right    ROBOTIC ASSISTED TOTAL HYSTERECTOMY N/A 03/05/2014   Procedure: ROBOTIC ASSISTED TOTAL HYSTERECTOMY/BILATERAL SALPINGECTOMY;  Surgeon: Marvene Staff, MD;  Location: [facility];  Service: Gynecology;  Laterality: N/A;   TUBAL LIGATION     Family History  Problem Relation Age of Onset   Arthritis Mother    Kidney disease Mother    Arthritis Father    Lung cancer Father    Heart attack Father    Lymphoma Father    Diabetes Father    Epilepsy Brother    Hyperlipidemia Maternal Grandmother    Stroke Maternal Grandmother    Hypertension Maternal Grandmother    Heart attack Maternal Grandmother    Diabetes Maternal Grandmother    Alcohol abuse Paternal Grandmother    Arthritis Paternal Grandmother    Mental illness Paternal Grandmother    Breast cancer Maternal Aunt    Lupus Son    Social History   Tobacco Use   Smoking status: Never   Smokeless tobacco: Never  Vaping Use   Vaping Use: Never used  Substance Use Topics   Alcohol use: Yes    Comment: 2-3 per week   Drug use: No   Current Outpatient Medications  Medication Sig Dispense Refill   albuterol (VENTOLIN HFA) 108 (90 Base) MCG/ACT inhaler TAKE 2 PUFFS BY MOUTH EVERY 6 HOURS AS NEEDED FOR WHEEZE OR SHORTNESS OF BREATH 6.7 each 0   fluticasone (FLOVENT HFA) 110 MCG/ACT inhaler Inhale 1 puff into the lungs in the morning and at bedtime. Rinse mouth after each use. 3 each 1   hydrOXYzine (ATARAX) 10 MG tablet TAKE 1 TABLET (10 MG TOTAL) BY MOUTH AT BEDTIME AS NEEDED FOR ANXIETY. 90  tablet 0   meclizine (ANTIVERT) 12.5 MG tablet Take 1 tablet (12.5 mg total) by mouth 3 (three) times daily as needed for dizziness. 30 tablet 0   montelukast (SINGULAIR) 10 MG tablet TAKE 1 TABLET (10 MG TOTAL) BY MOUTH AT BEDTIME. FOR ALLERGIES/ASTHMA. 90 tablet 3   olmesartan (BENICAR) 20 MG tablet TAKE 1 TABLET (20 MG TOTAL) BY MOUTH DAILY. FOR BLOOD PRESSURE. 90 tablet 2   sertraline (ZOLOFT) 50 MG tablet TAKE 1 TABLET (50 MG TOTAL) BY  MOUTH AT BEDTIME. FOR ANXIETY. 90 tablet 3   tiZANidine (ZANAFLEX) 2 MG tablet TAKE 1-2 TABLETS (2-4 MG TOTAL) BY MOUTH EVERY 6 (SIX) HOURS AS NEEDED FOR MUSCLE SPASMS. 720 tablet 2   No current facility-administered medications for this visit.   Allergies  Allergen Reactions   Phenergan [Promethazine Hcl] Hives and Rash   Sulfa Antibiotics Hives and Rash    Pt states sent her to the hospital.   Trazodone And Nefazodone     Hallucinations      Review of Systems: Positive for allergy, sinus trouble, arthritis, fatigue, headaches, night sweats.  All other systems reviewed and negative except where noted in HPI.    PHYSICAL EXAM :    Wt Readings from Last 3 Encounters:  10/21/21 109 lb 2 oz (49.5 kg)  10/02/21 116 lb (52.6 kg)  04/18/21 118 lb 4 oz (53.6 kg)    BP 120/90 (BP Location: Left Arm, Patient Position: Sitting, Cuff Size: Normal)    Pulse 96    Ht 5' 3.5" (1.613 m) Comment: height measured without shoes   Wt 109 lb 2 oz (49.5 kg)    LMP 03/05/2014 Comment: LMP x 32yrs ago    BMI 19.03 kg/m  Constitutional:  Thin female in no acute distress. Psychiatric: Pleasant. Normal mood and affect. Behavior is normal. EENT: Pupils normal.  Conjunctivae are normal. No scleral icterus. Neck supple.  Cardiovascular: Normal rate, regular rhythm. No edema Pulmonary/chest: Effort normal and breath sounds normal. No wheezing, rales or rhonchi. Abdominal: Soft, nondistended, nontender. Bowel sounds active throughout. There are no masses palpable. No  hepatomegaly. Neurological: Alert and oriented to person place and time. Skin: Skin is warm and dry. No rashes noted. Multiple tatoos  Tye Savoy, NP  10/21/2021, 9:48 AM  Cc:  Referring Provider Pleas Koch, NP

## 2021-10-21 NOTE — Progress Notes (Signed)
Attending Physician's Attestation   I have reviewed the chart.   I agree with the Advanced Practitioner's note, impression, and recommendations with any updates as below.    Khamari Yousuf Mansouraty, MD [facility] Advanced [facility]oscopy Office # [phone]  

## 2021-10-21 NOTE — Patient Instructions (Signed)
PROCEDURES: You have been scheduled for an EGD and Colonoscopy. Please follow the written instructions given to you at your visit today. Please pick up your prep supplies at the pharmacy within the next 1-3 days. If you use inhalers (even only as needed), please bring them with you on the day of your procedure.  MEDICATION: We have sent the following medication to your pharmacy for you to pick up at your convenience: Ondansetron (ZOFRAN ODT) 4 MG tablet- take 1 tablet every 8 hours as needed for nausea or vomiting. Reglan 10 MG tablet. Take 1 tablet one hour before each bowel prep.  LABS:   Please proceed to the basement level for lab work before leaving today. Press "B" on the elevator. The lab is located at the first door on the left as you exit the elevator.  HEALTHCARE LAWS AND MY CHART RESULTS:   Due to recent changes in healthcare laws, you may see results of your imaging and/or laboratory studies on MyChart before I have had a chance to review them.  I understand that in some cases there may be results that are confusing or concerning to you. Please understand that not all results are received at the same time and often I may need to interpret multiple results in order to provide you with the best plan of care or course of treatment. Therefore, I ask that you please give me 48 hours to thoroughly review all your results before contacting my office for clarification.    BMI:  If you are age 41 or older, your body mass index should be between 23-30. Your Body mass index is 19.03 kg/m. If this is out of the aforementioned range listed, please consider follow up with your Primary Care Provider.  If you are age 9 or younger, your body mass index should be between 19-25. Your Body mass index is 19.03 kg/m. If this is out of the aformentioned range listed, please consider follow up with your Primary Care Provider.   MY CHART:  The [facility] providers would like to encourage you to use  [facility] to communicate with providers for non-urgent requests or questions.  Due to long hold times on the telephone, sending your provider a message by [facility] may be a faster and more efficient way to get a response.  Please allow 48 business hours for a response.  Please remember that this is for non-urgent requests.   Thank you for trusting me with your gastrointestinal care!    Tye Savoy, NP

## 2021-10-22 LAB — IRON AND TIBC
Iron Saturation: 40 % (ref 15–55)
Iron: 107 ug/dL (ref 27–159)
Total Iron Binding Capacity: 266 ug/dL (ref 250–450)
UIBC: 159 ug/dL (ref 131–425)

## 2021-10-23 ENCOUNTER — Encounter: Payer: Self-pay | Admitting: Gastroenterology

## 2021-10-27 ENCOUNTER — Encounter: Payer: Self-pay | Admitting: Certified Registered Nurse Anesthetist

## 2021-10-28 ENCOUNTER — Ambulatory Visit (AMBULATORY_SURGERY_CENTER): Payer: No Typology Code available for payment source | Admitting: Gastroenterology

## 2021-10-28 ENCOUNTER — Encounter: Payer: Self-pay | Admitting: Gastroenterology

## 2021-10-28 ENCOUNTER — Other Ambulatory Visit: Payer: Self-pay

## 2021-10-28 ENCOUNTER — Emergency Department (HOSPITAL_COMMUNITY)
Admission: EM | Admit: 2021-10-28 | Discharge: 2021-10-28 | Disposition: A | Discharge status: Home / Self Care | Payer: No Typology Code available for payment source | Attending: Emergency Medicine | Admitting: Emergency Medicine

## 2021-10-28 ENCOUNTER — Encounter (HOSPITAL_COMMUNITY): Payer: Self-pay | Admitting: Emergency Medicine

## 2021-10-28 VITALS — BP 197/125 | HR 81 | Temp 97.7°F | Resp 18 | Ht 63.0 in | Wt 109.0 lb

## 2021-10-28 DIAGNOSIS — K640 First degree hemorrhoids: Secondary | ICD-10-CM

## 2021-10-28 DIAGNOSIS — R112 Nausea with vomiting, unspecified: Secondary | ICD-10-CM | POA: Diagnosis not present

## 2021-10-28 DIAGNOSIS — K2970 Gastritis, unspecified, without bleeding: Secondary | ICD-10-CM | POA: Diagnosis not present

## 2021-10-28 DIAGNOSIS — R739 Hyperglycemia, unspecified: Secondary | ICD-10-CM | POA: Diagnosis not present

## 2021-10-28 DIAGNOSIS — K529 Noninfective gastroenteritis and colitis, unspecified: Secondary | ICD-10-CM | POA: Diagnosis not present

## 2021-10-28 DIAGNOSIS — I10 Essential (primary) hypertension: Secondary | ICD-10-CM | POA: Insufficient documentation

## 2021-10-28 DIAGNOSIS — R634 Abnormal weight loss: Secondary | ICD-10-CM

## 2021-10-28 DIAGNOSIS — Z79899 Other long term (current) drug therapy: Secondary | ICD-10-CM | POA: Insufficient documentation

## 2021-10-28 DIAGNOSIS — K2950 Unspecified chronic gastritis without bleeding: Secondary | ICD-10-CM

## 2021-10-28 LAB — CBC WITH DIFFERENTIAL/PLATELET
Abs Immature Granulocytes: 0.01 10*3/uL (ref 0.00–0.07)
Basophils Absolute: 0 10*3/uL (ref 0.0–0.1)
Basophils Relative: 1 %
Eosinophils Absolute: 0.2 10*3/uL (ref 0.0–0.5)
Eosinophils Relative: 3 %
HCT: 36.8 % (ref 36.0–46.0)
Hemoglobin: 11.8 g/dL — ABNORMAL LOW (ref 12.0–15.0)
Immature Granulocytes: 0 %
Lymphocytes Relative: 25 %
Lymphs Abs: 1.2 10*3/uL (ref 0.7–4.0)
MCH: 31.1 pg (ref 26.0–34.0)
MCHC: 32.1 g/dL (ref 30.0–36.0)
MCV: 97.1 fL (ref 80.0–100.0)
Monocytes Absolute: 0.3 10*3/uL (ref 0.1–1.0)
Monocytes Relative: 7 %
Neutro Abs: 3 10*3/uL (ref 1.7–7.7)
Neutrophils Relative %: 64 %
Platelets: 211 10*3/uL (ref 150–400)
RBC: 3.79 MIL/uL — ABNORMAL LOW (ref 3.87–5.11)
RDW: 14.5 % (ref 11.5–15.5)
WBC: 4.7 10*3/uL (ref 4.0–10.5)
nRBC: 0 % (ref 0.0–0.2)

## 2021-10-28 LAB — COMPREHENSIVE METABOLIC PANEL
ALT: 18 U/L (ref 0–44)
AST: 28 U/L (ref 15–41)
Albumin: 3.6 g/dL (ref 3.5–5.0)
Alkaline Phosphatase: 81 U/L (ref 38–126)
Anion gap: 12 (ref 5–15)
BUN: <5 mg/dL — ABNORMAL LOW (ref 6–20)
CO2: 22 mmol/L (ref 22–32)
Calcium: 8.6 mg/dL — ABNORMAL LOW (ref 8.9–10.3)
Chloride: 107 mmol/L (ref 98–111)
Creatinine, Ser: 0.93 mg/dL (ref 0.44–1.00)
GFR, Estimated: >60 mL/min (ref >60)
Glucose, Bld: 118 mg/dL — ABNORMAL HIGH (ref 70–99)
Potassium: 3.7 mmol/L (ref 3.5–5.1)
Sodium: 141 mmol/L (ref 135–145)
Total Bilirubin: 0.3 mg/dL (ref 0.3–1.2)
Total Protein: 6.4 g/dL — ABNORMAL LOW (ref 6.5–8.1)

## 2021-10-28 LAB — TROPONIN I (HIGH SENSITIVITY): Troponin I (High Sensitivity): 6 ng/L (ref <18)

## 2021-10-28 MED ORDER — ESOMEPRAZOLE MAGNESIUM 40 MG PO PACK: 40.0000 mg | PACK | Freq: Two times a day (BID) | ORAL | 6 refills | Status: DC

## 2021-10-28 MED ORDER — SODIUM CHLORIDE 0.9 % IV SOLN: 500.0000 mL | Freq: Once | INTRAVENOUS | Status: DC

## 2021-10-28 NOTE — Progress Notes (Signed)
1121 BP 195/125, Labetalol given IV, MD update, vss

## 2021-10-28 NOTE — Progress Notes (Signed)
1133 BP  167/120, Labetalol given IV, MD update, vss

## 2021-10-28 NOTE — Progress Notes (Signed)
VS  DT ? ?Pt's states no medical or surgical changes since previsit or office visit. ? ?

## 2021-10-28 NOTE — Patient Instructions (Addendum)
Handouts given for gastritis, Hiatal hernia, and hemorrhoids.  High fiber diet.  Use Benefiber 1-2 times daily. Start Nexium 40mg  twice daily.  Await pathology results.    YOU HAD AN ENDOSCOPIC PROCEDURE TODAY AT THE [facility]:   Refer to the procedure report that was given to you for any specific questions about what was found during the examination.  If the procedure report does not answer your questions, please call your gastroenterologist to clarify.  If you requested that your care partner not be given the details of your procedure findings, then the procedure report has been included in a sealed envelope for you to review at your convenience later.      YOU SHOULD EXPECT: Some feelings of bloating in the abdomen. Passage of more gas than usual.  Walking can help get rid of the air that was put into your GI tract during the procedure and reduce the bloating. If you had a lower endoscopy (such as a colonoscopy or flexible sigmoidoscopy) you may notice spotting of blood in your stool or on the toilet paper. If you underwent a bowel prep for your procedure, you may not have a normal bowel movement for a few days.  Please Note:  You might notice some irritation and congestion in your nose or some drainage.  This is from the oxygen used during your procedure.  There is no need for concern and it should clear up in a day or so.  SYMPTOMS TO REPORT IMMEDIATELY:  Following lower endoscopy (colonoscopy or flexible sigmoidoscopy):  Excessive amounts of blood in the stool  Significant tenderness or worsening of abdominal pains  Swelling of the abdomen that is new, acute  Fever of 100F or higher  Following upper endoscopy (EGD)  Vomiting of blood or coffee ground material  New chest pain or pain under the shoulder blades  Painful or persistently difficult swallowing  New shortness of breath  Fever of 100F or higher  Black, tarry-looking stools  For urgent or emergent issues, a  gastroenterologist can be reached at any hour by calling [phone]. Do not use MyChart messaging for urgent concerns.    DIET:  We do recommend a small meal at first, but then you may proceed to your regular diet.  Drink plenty of fluids but you should avoid alcoholic beverages for 24 hours.  ACTIVITY:  You should plan to take it easy for the rest of today and you should NOT DRIVE or use heavy machinery until tomorrow (because of the sedation medicines used during the test).    FOLLOW UP: Our staff will call the number listed on your records 48-72 hours following your procedure to check on you and address any questions or concerns that you may have regarding the information given to you following your procedure. If we do not reach you, we will leave a message.  We will attempt to reach you two times.  During this call, we will ask if you have developed any symptoms of COVID 19. If you develop any symptoms (ie: fever, flu-like symptoms, shortness of breath, cough etc.) before then, please call [phone].  If you test positive for Covid 19 in the 2 weeks post procedure, please call and report this information to [phone].    If any biopsies were taken you will be contacted by phone or by letter within the next 1-3 weeks.  Please call us at [phone] if you have not heard about the biopsies in 3 weeks.  SIGNATURES/CONFIDENTIALITY: You and/or your care partner have signed paperwork which will be entered into your electronic medical record.  These signatures attest to the fact that that the information above on your After Visit Summary has been reviewed and is understood.  Full responsibility of the confidentiality of this discharge information lies with you and/or your care-partner.

## 2021-10-28 NOTE — Progress Notes (Signed)
Report given to PACU, vss 

## 2021-10-28 NOTE — Progress Notes (Signed)
BP running high--!90s/120s, spoke with Dr Meridee Score and plan to do upper last if BP stabilizes. Robinul 0.1 mg IV given due large amount of secretions upon assessment.  MD made aware, vss

## 2021-10-28 NOTE — Progress Notes (Signed)
Called to room to assist during endoscopic procedure.  Patient ID and intended procedure confirmed with present staff. Received instructions for my participation in the procedure from the performing physician.  

## 2021-10-28 NOTE — Op Note (Signed)
[facility] Patient Name: Kelly Walton Procedure Date: 10/28/2021 11:17 AM MRN: [id number] Endoscopist: Justice Britain , MD Age: 51 Referring MD:  Date of Birth: 09-Nov-1970 Gender: Female Account #: [id number] Procedure:                Upper GI endoscopy Indications:              Exclusion of peptic ulcer, Nausea with vomiting,                            Weight loss Medicines:                Monitored Anesthesia Care Procedure:                Pre-Anesthesia Assessment:                           - Prior to the procedure, a History and Physical                            was performed, and patient medications and                            allergies were reviewed. The patient's tolerance of                            previous anesthesia was also reviewed. The risks                            and benefits of the procedure and the sedation                            options and risks were discussed with the patient.                            All questions were answered, and informed consent                            was obtained. Prior Anticoagulants: The patient has                            taken no previous anticoagulant or antiplatelet                            agents. ASA Grade Assessment: II - A patient with                            mild systemic disease. After reviewing the risks                            and benefits, the patient was deemed in                            satisfactory condition to undergo the procedure.  After obtaining informed consent, the endoscope was                            passed under direct vision. Throughout the                            procedure, the patient's blood pressure, pulse, and                            oxygen saturations were monitored continuously. The                            Endoscope was introduced through the mouth, and                            advanced to the second part of duodenum.  The upper                            GI endoscopy was accomplished without difficulty.                            The patient tolerated the procedure. Scope In: Scope Out: Findings:                 No gross lesions were noted in the entire esophagus.                           The Z-line was irregular and was found 36 cm from                            the incisors.                           A 3 cm hiatal hernia was present.                           One non-bleeding linear gastric ulcer with a clean                            ulcer base (Forrest Class III) was found on the                            greater curvature of the stomach. The lesion was 12                            mm in largest dimension.                           Patchy moderate inflammation characterized by                            erosions, erythema and linear erosions was found in  the entire examined stomach. Biopsies were taken                            with a cold forceps for histology and Helicobacter                            pylori testing.                           No gross lesions were noted in the duodenal bulb,                            in the first portion of the duodenum and in the                            second portion of the duodenum. Biopsies were taken                            with a cold forceps for histology. Complications:            No immediate complications. Estimated Blood Loss:     Estimated blood loss was minimal. Impression:               - No gross lesions in esophagus. Z-line irregular,                            36 cm from the incisors.                           - 3 cm hiatal hernia.                           - Linear non-bleeding gastric ulcer with a clean                            ulcer base (Forrest Class III) in greater curve.                            Gastritis in entire stomach - biopsied.                           - No gross lesions in the  duodenal bulb, in the                            first portion of the duodenum and in the second                            portion of the duodenum. Biopsied. Recommendation:           - The patient will be observed post-procedure,                            until all discharge criteria are met.                           -  Discharge patient to home, if patient's BP and                            hemodynamics allow (she required 2 doses of IV                            Labetolol to get BPs to remain under SBPs of 180s                            and for DBPs of less than 120s). If she is unable                            to keep SBP<200 or DBP <120 then will strongly                            encourage to go to the ED for further evaluation                            and managment of this.                           - Patient has a contact number available for                            emergencies. The signs and symptoms of potential                            delayed complications were discussed with the                            patient. Return to normal activities tomorrow.                            Written discharge instructions were provided to the                            patient.                           - Resume previous diet.                           - Start Nexium 40 mg twice daily.                           - Observe patient's clinical course.                           - Await pathology results.                           - Repeat upper endoscopy in 4 months to check  healing.                           - The findings and recommendations were discussed                            with the patient.                           - The findings and recommendations were discussed                            with the patient's family. Justice Britain, MD 10/28/2021 11:50:36 AM

## 2021-10-28 NOTE — ED Provider Triage Note (Signed)
Emergency Medicine Provider Triage Evaluation Note  Kelly Walton , a 51 y.o. female  was evaluated in triage.  Pt complains of hypertension.  She had endoscopy procedure today and was noted that she was hypertensive prior and after the procedure.  Blood pressure was with systolic over 200 and diastolic over 110.  She was referred here to the emergency department following her procedure.  Patient states that she is feeling completely normal.  Denies chest pain, shortness of breath.  She does have a mild headache, however nothing unusual for her.  He is compliant with her blood pressure medications at home. She takes Benicar.  Review of Systems  Positive:  Negative:   Physical Exam  BP (!) 201/131 (BP Location: Right Arm)    Pulse 81    Temp 98.5 F (36.9 C) (Oral)    Resp 16    LMP 03/05/2014 Comment: LMP x 71yrs ago    SpO2 97%  Gen:   Awake, no distress   Resp:  Normal effort  MSK:   Moves extremities without difficulty  Other:  Heart RRR. No murmurs. Lungs CTA  Medical Decision Making  Medically screening exam initiated at 1:39 PM.  Appropriate orders placed.  [name] was informed that the remainder of the evaluation will be completed by another provider, this initial triage assessment does not replace that evaluation, and the importance of remaining in the ED until their evaluation is complete.  Asymptomatic hypertension.  Will obtain screening labs to rule out endorgan damage.     Kelly Walton, [place] 10/28/21 1341

## 2021-10-28 NOTE — ED Notes (Signed)
Pt verbalized understanding of discharge instructions; opportunity for questions provided °

## 2021-10-28 NOTE — Progress Notes (Signed)
GASTROENTEROLOGY PROCEDURE H&P NOTE   Primary Care Physician: Doreene Nest, NP  HPI: Kelly Walton is a 51 y.o. female who presents for EGD/Colonoscopy for evaluation of chronic diarrhea as well as nausea and vomiting but needs initial screening as well.  Past Medical History:  Diagnosis Date   AC separation, right, initial encounter    Acute conjunctivitis of left eye 12/27/2020   Acute right lower quadrant pain 06/10/2018   Alcohol abuse    Anxiety    Arthritis    Asthma    Hypertension    Insomnia    Pain of upper abdomen 06/21/2017   Palpitations    a. 05/2019 Echo: EF 60-65%, diast dysfxn; b. 05/2019 Zio: Sinus rhythm-sinus tach. Avg HR 105. 6 SVT episodes - longest 7 beats. Rare PVCs-->Diltiazem added.   Periumbilical abdominal pain 03/12/2020   Spongiotic dermatitis    Past Surgical History:  Procedure Laterality Date   CESAREAN SECTION     FOOT FRACTURE SURGERY Right    ORIF CLAVICULAR FRACTURE Right 06/17/2020   Procedure: OPEN REDUCTION INTERNAL FIXATION (ORIF) RIGHT CLAVICLE FRACTURE, ACROMIOCLAVICULAR SEPARATION;  Surgeon: Tarry Kos, MD;  Location: [facility];  Service: Orthopedics;  Laterality: Right;   ORIF CLAVICULAR FRACTURE Right 07/05/2020   Procedure: REVISION OPEN REDUCTION INTERNAL FIXATION (ORIF) RIGHT CLAVICLE FRACTURE, ACROMIOCLAVICULAR SEPARATION;  Surgeon: Tarry Kos, MD;  Location: [facility];  Service: Orthopedics;  Laterality: Right;   PERCUTANEOUS PINNING PHALANX FRACTURE OF HAND Right    ROBOTIC ASSISTED TOTAL HYSTERECTOMY N/A 03/05/2014   Procedure: ROBOTIC ASSISTED TOTAL HYSTERECTOMY/BILATERAL SALPINGECTOMY;  Surgeon: Serita Kyle, MD;  Location: [facility];  Service: Gynecology;  Laterality: N/A;   TUBAL LIGATION     Current Outpatient Medications  Medication Sig Dispense Refill   albuterol (VENTOLIN HFA) 108 (90 Base) MCG/ACT inhaler TAKE 2 PUFFS BY MOUTH EVERY 6 HOURS AS NEEDED FOR WHEEZE OR SHORTNESS OF BREATH  6.7 each 0   hydrOXYzine (ATARAX) 10 MG tablet TAKE 1 TABLET (10 MG TOTAL) BY MOUTH AT BEDTIME AS NEEDED FOR ANXIETY. 90 tablet 0   montelukast (SINGULAIR) 10 MG tablet TAKE 1 TABLET (10 MG TOTAL) BY MOUTH AT BEDTIME. FOR ALLERGIES/ASTHMA. 90 tablet 3   olmesartan (BENICAR) 20 MG tablet TAKE 1 TABLET (20 MG TOTAL) BY MOUTH DAILY. FOR BLOOD PRESSURE. 90 tablet 2   sertraline (ZOLOFT) 50 MG tablet TAKE 1 TABLET (50 MG TOTAL) BY MOUTH AT BEDTIME. FOR ANXIETY. 90 tablet 3   fluticasone (FLOVENT HFA) 110 MCG/ACT inhaler Inhale 1 puff into the lungs in the morning and at bedtime. Rinse mouth after each use. 3 each 1   meclizine (ANTIVERT) 12.5 MG tablet Take 1 tablet (12.5 mg total) by mouth 3 (three) times daily as needed for dizziness. 30 tablet 0   ondansetron (ZOFRAN) 4 MG tablet Take 1 tablet (4 mg total) by mouth every 8 (eight) hours as needed for nausea or vomiting. 30 tablet 2   tiZANidine (ZANAFLEX) 2 MG tablet TAKE 1-2 TABLETS (2-4 MG TOTAL) BY MOUTH EVERY 6 (SIX) HOURS AS NEEDED FOR MUSCLE SPASMS. 720 tablet 2   Current Facility-Administered Medications  Medication Dose Route Frequency Provider Last Rate Last Admin   0.9 %  sodium chloride infusion  500 mL Intravenous Once Mansouraty, Netty Starring., MD        Current Outpatient Medications:    albuterol (VENTOLIN HFA) 108 (90 Base) MCG/ACT inhaler, TAKE 2 PUFFS BY MOUTH EVERY 6 HOURS AS NEEDED FOR WHEEZE  OR SHORTNESS OF BREATH, Disp: 6.7 each, Rfl: 0   hydrOXYzine (ATARAX) 10 MG tablet, TAKE 1 TABLET (10 MG TOTAL) BY MOUTH AT BEDTIME AS NEEDED FOR ANXIETY., Disp: 90 tablet, Rfl: 0   montelukast (SINGULAIR) 10 MG tablet, TAKE 1 TABLET (10 MG TOTAL) BY MOUTH AT BEDTIME. FOR ALLERGIES/ASTHMA., Disp: 90 tablet, Rfl: 3   olmesartan (BENICAR) 20 MG tablet, TAKE 1 TABLET (20 MG TOTAL) BY MOUTH DAILY. FOR BLOOD PRESSURE., Disp: 90 tablet, Rfl: 2   sertraline (ZOLOFT) 50 MG tablet, TAKE 1 TABLET (50 MG TOTAL) BY MOUTH AT BEDTIME. FOR ANXIETY., Disp:  90 tablet, Rfl: 3   fluticasone (FLOVENT HFA) 110 MCG/ACT inhaler, Inhale 1 puff into the lungs in the morning and at bedtime. Rinse mouth after each use., Disp: 3 each, Rfl: 1   meclizine (ANTIVERT) 12.5 MG tablet, Take 1 tablet (12.5 mg total) by mouth 3 (three) times daily as needed for dizziness., Disp: 30 tablet, Rfl: 0   ondansetron (ZOFRAN) 4 MG tablet, Take 1 tablet (4 mg total) by mouth every 8 (eight) hours as needed for nausea or vomiting., Disp: 30 tablet, Rfl: 2   tiZANidine (ZANAFLEX) 2 MG tablet, TAKE 1-2 TABLETS (2-4 MG TOTAL) BY MOUTH EVERY 6 (SIX) HOURS AS NEEDED FOR MUSCLE SPASMS., Disp: 720 tablet, Rfl: 2  Current Facility-Administered Medications:    0.9 %  sodium chloride infusion, 500 mL, Intravenous, Once, Mansouraty, Netty Starring., MD Allergies  Allergen Reactions   Phenergan [Promethazine Hcl] Hives and Rash   Sulfa Antibiotics Hives and Rash    Pt states sent her to the hospital.   Trazodone And Nefazodone     Hallucinations    Family History  Problem Relation Age of Onset   Arthritis Mother    Kidney disease Mother    Arthritis Father    Lung cancer Father    Heart attack Father    Lymphoma Father    Diabetes Father    Epilepsy Brother    Breast cancer Maternal Aunt    Hyperlipidemia Maternal Grandmother    Stroke Maternal Grandmother    Hypertension Maternal Grandmother    Heart attack Maternal Grandmother    Diabetes Maternal Grandmother    Alcohol abuse Paternal Grandmother    Arthritis Paternal Grandmother    Mental illness Paternal Grandmother    Lupus Son    Colon cancer Neg Hx    Esophageal cancer Neg Hx    Stomach cancer Neg Hx    Rectal cancer Neg Hx    Social History   Socioeconomic History   Marital status: Single    Spouse name: Not on file   Number of children: 2   Years of education: Not on file   Highest education level: Not on file  Occupational History   Occupation: Event organiser: [employer]  Tobacco Use   Smoking status:  Never   Smokeless tobacco: Never  Vaping Use   Vaping Use: Never used  Substance and Sexual Activity   Alcohol use: Yes    Comment: 2-3 per week   Drug use: No   Sexual activity: Not on file  Other Topics Concern   Not on file  Social History Narrative   Single.   2 children, 1 step-son.   Manager at [employer].   Enjoys going to [employer], spending time with family.    Social Determinants of Health   Financial Resource Strain: Not on file  Food Insecurity: Not on file  Transportation Needs: Not on  file  Physical Activity: Not on file  Stress: Not on file  Social Connections: Not on file  Intimate Partner Violence: Not on file    Physical Exam: Today's Vitals   10/28/21 1026  BP: (!) 145/120  Pulse: (!) 111  Temp: 97.7 F (36.5 C)  TempSrc: Temporal  SpO2: 96%  Weight: 109 lb (49.4 kg)  Height: 5\' 3"  (1.6 m)   Body mass index is 19.31 kg/m. GEN: NAD EYE: Sclerae anicteric ENT: MMM CV: Non-tachycardic GI: Soft, NT/ND NEURO:  Alert & Oriented x 3  Lab Results: No results for input(s): WBC, HGB, HCT, PLT in the last 72 hours. BMET No results for input(s): NA, K, CL, CO2, GLUCOSE, BUN, CREATININE, CALCIUM in the last 72 hours. LFT No results for input(s): PROT, ALBUMIN, AST, ALT, ALKPHOS, BILITOT, BILIDIR, IBILI in the last 72 hours. PT/INR No results for input(s): LABPROT, INR in the last 72 hours.   Impression / Plan: This is a 51 y.o.female who presents for EGD/Colonoscopy for evaluation of chronic diarrhea as well as nausea and vomiting but needs initial screening as well.  Patient has had elevation in SBPs into the 160-180 range.  Patient took BP medications this AM.  We will attempt colonoscopy and if things stabilize with BP during procedure will attempt EGD.  If at any timepoint BP becomes unsafe to continue we will stop the procedure.  She is aware that this could occur during the Colonoscopy or EGD portion or both.  The risks and benefits of endoscopic  evaluation/treatment were discussed with the patient and/or family; these include but are not limited to the risk of perforation, infection, bleeding, missed lesions, lack of diagnosis, severe illness requiring hospitalization, as well as anesthesia and sedation related illnesses.  The patient's history has been reviewed, patient examined, no change in status, and deemed stable for procedure.  The patient and/or family is agreeable to proceed.    55, MD [facility] # [place]

## 2021-10-28 NOTE — ED Provider Notes (Signed)
[facility] EMERGENCY DEPARTMENT Provider Note   CSN: [id number] Arrival date & time: 10/28/21  1312     History  Chief Complaint  Patient presents with   Hypertension    Kelly Walton is a 51 y.o. female.  Patient with longstanding hypertension, on olmesartan 20 mg daily --presents to the emergency department after having colonoscopy and endoscopy today.  She last took her blood pressure medication last evening.  Prior to her procedure today she was given IV labetalol.  As blood pressures were persistently elevated, she was referred to the emergency department.  Patient currently only has a headache, which she states is typical for her. Patient denies signs of stroke including: facial droop, slurred speech, aphasia, weakness/numbness in extremities, imbalance/trouble walking.  No vision change or vision loss.  No chest pain or shortness of breath.  Currently has mild "gas pains" in her abdomen, but states she is hungry.  She is already sent a message to her PCP regarding her blood pressure being up.      Home Medications Prior to Admission medications   Medication Sig Start Date End Date Taking? Authorizing Provider  albuterol (VENTOLIN HFA) 108 (90 Base) MCG/ACT inhaler TAKE 2 PUFFS BY MOUTH EVERY 6 HOURS AS NEEDED FOR WHEEZE OR SHORTNESS OF BREATH 09/26/21   Doreene Nest, NP  esomeprazole (NEXIUM) 40 MG packet Take 40 mg by mouth in the morning and at bedtime. 10/28/21   Mansouraty, Netty Starring., MD  fluticasone (FLOVENT HFA) 110 MCG/ACT inhaler Inhale 1 puff into the lungs in the morning and at bedtime. Rinse mouth after each use. 10/02/21   Doreene Nest, NP  hydrOXYzine (ATARAX) 10 MG tablet TAKE 1 TABLET (10 MG TOTAL) BY MOUTH AT BEDTIME AS NEEDED FOR ANXIETY. 10/12/21   Doreene Nest, NP  meclizine (ANTIVERT) 12.5 MG tablet Take 1 tablet (12.5 mg total) by mouth 3 (three) times daily as needed for dizziness. 04/18/21   Eden Emms, NP  montelukast  (SINGULAIR) 10 MG tablet TAKE 1 TABLET (10 MG TOTAL) BY MOUTH AT BEDTIME. FOR ALLERGIES/ASTHMA. 02/28/21   Doreene Nest, NP  olmesartan (BENICAR) 20 MG tablet TAKE 1 TABLET (20 MG TOTAL) BY MOUTH DAILY. FOR BLOOD PRESSURE. 04/29/21   Doreene Nest, NP  ondansetron (ZOFRAN) 4 MG tablet Take 1 tablet (4 mg total) by mouth every 8 (eight) hours as needed for nausea or vomiting. 10/21/21   Meredith Pel, NP  sertraline (ZOLOFT) 50 MG tablet TAKE 1 TABLET (50 MG TOTAL) BY MOUTH AT BEDTIME. FOR ANXIETY. 03/18/21   Doreene Nest, NP  tiZANidine (ZANAFLEX) 2 MG tablet TAKE 1-2 TABLETS (2-4 MG TOTAL) BY MOUTH EVERY 6 (SIX) HOURS AS NEEDED FOR MUSCLE SPASMS. 04/14/21   Hilts, Casimiro Needle, MD      Allergies    Phenergan [promethazine hcl], Sulfa antibiotics, and Trazodone and nefazodone    Review of Systems   Review of Systems  Physical Exam Updated Vital Signs BP (!) 176/114    Pulse 80    Temp 98.5 F (36.9 C) (Oral)    Resp 19    LMP 03/05/2014 Comment: LMP x 52yrs ago    SpO2 98%  Physical Exam Vitals and nursing note reviewed.  Constitutional:      Appearance: She is well-developed. She is not diaphoretic.  HENT:     Head: Normocephalic and atraumatic.     Right Ear: Tympanic membrane, ear canal and external ear normal.  Left Ear: Tympanic membrane, ear canal and external ear normal.     Nose: Nose normal.     Mouth/Throat:     Mouth: Mucous membranes are not dry.     Pharynx: Uvula midline.  Eyes:     General: Lids are normal.     Extraocular Movements:     Right eye: No nystagmus.     Left eye: No nystagmus.     Conjunctiva/sclera: Conjunctivae normal.     Pupils: Pupils are equal, round, and reactive to light.  Neck:     Vascular: Normal carotid pulses. No JVD.     Trachea: Trachea normal. No tracheal deviation.  Cardiovascular:     Rate and Rhythm: Normal rate and regular rhythm.     Pulses: No decreased pulses.          Radial pulses are 2+ on the right side and  2+ on the left side.     Heart sounds: Normal heart sounds, S1 normal and S2 normal. No murmur heard. Pulmonary:     Effort: Pulmonary effort is normal. No respiratory distress.     Breath sounds: Normal breath sounds. No wheezing.  Chest:     Chest wall: No tenderness.  Abdominal:     General: Bowel sounds are normal.     Palpations: Abdomen is soft.     Tenderness: There is abdominal tenderness. There is no guarding or rebound.     Comments: Mild diffuse tenderness  Musculoskeletal:        General: Normal range of motion.     Cervical back: Normal range of motion and neck supple. No tenderness or bony tenderness. No muscular tenderness.  Skin:    General: Skin is warm and dry.     Coloration: Skin is not pale.  Neurological:     Mental Status: She is alert and oriented to person, place, and time.     GCS: GCS eye subscore is 4. GCS verbal subscore is 5. GCS motor subscore is 6.     Cranial Nerves: No cranial nerve deficit.     Sensory: No sensory deficit.     Motor: No weakness.     Coordination: Coordination normal.     Gait: Gait normal.     Comments: Upper extremity myotomes tested bilaterally:  C5 Shoulder abduction 5/5 C6 Elbow flexion/wrist extension 5/5 C7 Elbow extension 5/5 C8 Finger flexion 5/5 T1 Finger abduction 5/5  Lower extremity myotomes tested bilaterally: L2 Hip flexion 5/5 L3 Knee extension 5/5 L4 Ankle dorsiflexion 5/5 S1 Ankle plantar flexion 5/5     ED Results / Procedures / Treatments   Labs (all labs ordered are listed, but only abnormal results are displayed) Labs Reviewed  COMPREHENSIVE METABOLIC PANEL - Abnormal; Notable for the following components:      Result Value   Glucose, Bld 118 (*)    BUN <5 (*)    Calcium 8.6 (*)    Total Protein 6.4 (*)    All other components within normal limits  CBC WITH DIFFERENTIAL/PLATELET - Abnormal; Notable for the following components:   RBC 3.79 (*)    Hemoglobin 11.8 (*)    All other components  within normal limits  TROPONIN I (HIGH SENSITIVITY)    ED ECG REPORT   Date: 10/28/2021  Rate: 78  Rhythm: normal sinus rhythm  QRS Axis: normal  Intervals: normal  ST/T Wave abnormalities: normal  Conduction Disutrbances:none  Narrative Interpretation:   Old EKG Reviewed: changed, PR shorter today from  03/2020  I have personally reviewed the EKG tracing and agree with the computerized printout as noted.   Radiology No results found.  Procedures Procedures    Medications Ordered in ED Medications - No data to display  ED Course/ Medical Decision Making/ A&P    Patient seen and examined. History obtained directly from patient. Work-up including labs, imaging, EKG ordered in triage, if performed, were reviewed.    Labs/EKG: Independently reviewed and interpreted.  This included: CBC with normal white blood cell count, minimally low hemoglobin at 11.8; CMP with mildly elevated glucose of 118, normal kidney function; troponin normal.  EKG normal with no significant changes.  Imaging: None ordered.  Medications/Fluids: Considered administration of: Home blood pressure medication olmesartan, patient would rather take her medication when she returns home.  I feel this is reasonable.  Most recent vital signs reviewed and are as follows: BP (!) 176/114    Pulse 80    Temp 98.5 F (36.9 C) (Oral)    Resp 19    LMP 03/05/2014 Comment: LMP x 58yrs ago    SpO2 98%   Initial impression: Hypertension.  Patient had a procedure this morning and has not taken blood pressure medication since last night.  No signs of endorgan damage.  Mild headache only with normal neurologic exam.  Patient has headaches and is not worried about this particular headache.    Plan: Discharge to home.  Patient is in contact with her PCP and should receive additional instructions regarding her blood pressure medication.  However, I did state that if her blood pressure is greater than 150 systolic or greater  than 100 diastolic at home prior to taking her evening medications, she may consider increasing the dose from 20 mg to 40 mg temporarily until she comes up with a plan with her provider.                          Medical Decision Making  Patient sent in after endoscopy today for elevated blood pressures.  She is currently on monotherapy.  She is asymptomatic and there are no compelling signs of endorgan damage today.  Screening labs with blood work and EKG were reassuring.  Patient without any concerning objective findings on exam.  She will continue her home blood pressure medication with adjustment as above until she can follow-up with her primary provider.        Final Clinical Impression(s) / ED Diagnoses Final diagnoses:  Primary hypertension    Rx / DC Orders ED Discharge Orders     None         Renne Crigler, PA-C 10/28/21 1528    Alvira Monday, MD 10/30/21 2213

## 2021-10-28 NOTE — Progress Notes (Signed)
The patient blood pressures at the time of my evaluation were in the 150s over 90s and felt that this was adequate for discharge.  The patient subsequently had increased blood pressures into the upper 190s over 120s.  This persisted even after her complete recovery time.  She had no chest pain or chest pressure or chest discomfort or lightheadedness or headaches. The patient based on my criteria that I had outlined was not amenable to overt discharge without needing further blood pressure management. Based on the clinical situation and the patient's stability although significantly elevated blood pressure, I felt that it was best for the patient to be taken by private car to the emergency room to have further evaluation and consideration of optimization of blood pressure issues. It was found out later that the patient may continue to have a significant alcohol use disorder and subsequently blood pressure issues could be a result of her use. Our recommendation was outlined to the patient and husband and they agreed with this plan of action. If they decide to go to the emergency room they understand that that is AGAINST MEDICAL ADVICES. They do understand though that if they do not go to the emergency room and develop chest pain or chest pressure or chest discomfort or progressive worsening headache that at that point they really should go to the emergency room for emergency evaluation blood pressure evaluation. She should follow-up with her PCP to consider further titration of blood pressure medication. As outlined in my procedure note, there is a chance that some of her diarrhea could be ARB-related and if that were to be the case, she may need other medications to be titrated/added.   Corliss Parish, MD [facility] # [phone]

## 2021-10-28 NOTE — Discharge Instructions (Signed)
Please read and follow all provided instructions.  Your diagnoses today include:  1. Primary hypertension     Your blood pressure was high today (BP): BP (!) 176/114    Pulse 80    Temp 98.5 F (36.9 C) (Oral)    Resp 19    LMP 03/05/2014 Comment: LMP x 79yrs ago    SpO2 98%   Tests performed today include: Vital signs. See below for your results today.  Blood cell counts and electrolytes Cardiac enzymes are stress on the heart EKG  Medications prescribed:  None  Home care instructions:  Follow any educational materials contained in this packet.  If you check your blood pressure before you take your medication at night and the top number is above 150 or the bottom number is above 100, you may take 40 mg olmesartan total (2 20mg  tablets) until you can follow-up with your doctor for further instructions.  Follow-up instructions: Please follow-up with your primary care provider in the next 3 days for a recheck of your symptoms and blood pressure.    Return instructions:  Please return to the Emergency Department if you experience worsening symptoms.  Return with severe chest pain, abdominal pain, or shortness of breath.  Return with severe headache, focal weakness, numbness, difficulty with speech or vision. Return with loss of vision or sudden vision change. Please return if you have any other emergent concerns.  Additional Information:  Your vital signs today were: BP (!) 176/114    Pulse 80    Temp 98.5 F (36.9 C) (Oral)    Resp 19    LMP 03/05/2014 Comment: LMP x 20yrs ago    SpO2 98%  If your blood pressure (BP) was elevated above 135/85 this visit, please have this repeated by your doctor within one month. --------------

## 2021-10-28 NOTE — Op Note (Signed)
[facility] Patient Name: Kelly Walton Procedure Date: 10/28/2021 11:18 AM MRN: [id number] Endoscopist: Justice Britain , MD Age: 51 Referring MD:  Date of Birth: 01/20/1971 Gender: Female Account #: [id number] Procedure:                Colonoscopy Indications:              Screening for colorectal malignant neoplasm,                            Incidental - Chronic diarrhea, Incidental - Weight                            loss Medicines:                Monitored Anesthesia Care Procedure:                Pre-Anesthesia Assessment:                           - Prior to the procedure, a History and Physical                            was performed, and patient medications and                            allergies were reviewed. The patient's tolerance of                            previous anesthesia was also reviewed. The risks                            and benefits of the procedure and the sedation                            options and risks were discussed with the patient.                            All questions were answered, and informed consent                            was obtained. Prior Anticoagulants: The patient has                            taken no previous anticoagulant or antiplatelet                            agents. ASA Grade Assessment: II - A patient with                            mild systemic disease. After reviewing the risks                            and benefits, the patient was deemed in  satisfactory condition to undergo the procedure.                           After obtaining informed consent, the colonoscope                            was passed under direct vision. Throughout the                            procedure, the patient's blood pressure, pulse, and                            oxygen saturations were monitored continuously. The                            PCF-HQ190L Colonoscope was introduced through the                             anus and advanced to the 5 cm into the ileum. The                            colonoscopy was performed without difficulty. The                            patient tolerated the procedure. The quality of the                            bowel preparation was adequate. The terminal ileum,                            ileocecal valve, appendiceal orifice, and rectum                            were photographed. Scope In: 11:25:20 AM Scope Out: 11:37:07 AM Scope Withdrawal Time: 0 hours 8 minutes 38 seconds  Total Procedure Duration: 0 hours 11 minutes 47 seconds  Findings:                 The digital rectal exam findings include                            hemorrhoids. Pertinent negatives include no                            palpable rectal lesions.                           The terminal ileum and ileocecal valve appeared                            normal.                           Normal mucosa was found in the entire colon.  Biopsies for histology were taken with a cold                            forceps from the entire colon for evaluation of                            microscopic colitis.                           Non-bleeding non-thrombosed external and internal                            hemorrhoids were found during retroflexion, during                            perianal exam and during digital exam. The                            hemorrhoids were Grade I (internal hemorrhoids that                            do not prolapse). Complications:            No immediate complications. Estimated Blood Loss:     Estimated blood loss was minimal. Impression:               - Hemorrhoids found on digital rectal exam.                           - The examined portion of the ileum was normal.                           - Normal mucosa in the entire examined colon.                            Biopsied.                           - Non-bleeding  non-thrombosed external and internal                            hemorrhoids. Recommendation:           - Proceed to scheduled EGD with another 10 mg of                            Labetolol to be administered in effort of trying to                            keep SBPs<180 and DBPs<120.                           - High fiber diet.                           - Use Benefiber 1-2 times daily to try and bulk  stool.                           - Consider EPI and SIBO breath testing or IBS-D                            treatment if issues persist in future.                           - Olmesartan induced colopathy could be possible                            and at times pathology can still be normal, so will                            need to consider if issues of diarrhea persist for                            just trying patient off of this medication                            (however, her BP is not well controlled so would                            need PCP to consider this).                           - Await pathology results.                           - Repeat colonoscopy in 10 years for screening                            purposes.                           - The findings and recommendations were discussed                            with the patient.                           - The findings and recommendations were discussed                            with the patient's family. Justice Britain, MD 10/28/2021 11:55:19 AM

## 2021-10-28 NOTE — ED Triage Notes (Signed)
Patient sent to ED after endoscopy for evaluation of hypertension, BP in triage 201/131. Patient has history of hypertension, reports not deviation in way that she takes her medications. Patient has no complaints, denies headache, denies blurred vision. Patient alert, oriented, and in no apparent distress at this time.

## 2021-10-28 NOTE — Progress Notes (Signed)
BP elevated during visit to [facility]. Pt received 2 does of IV labetalol during her colonoscopy and EGD to help control her BP. In PACU BP continues to be elevated and is fluctuating. BP not consistently staying below SBP<200 or DBP<120. Last BP 197/125. Spoke with Dr. Meridee Score and his recommendation was for pt to go to the ED to be evaluated. Discussed this with the pt and her husband and they were agreeable to plan of care. Per Dr. Meridee Score if pt was agreeable and the husband felt comfortable that she could go to the ED via car. IV removed and pt taken to the bathroom prior to discharge. At this time the husband asked RN questions. He asked if pt's alcohol consumption could cause the gastric ulcers. He stated to RN "it is unreal how much alcohol/liquor she drinks on a daily basis." RN informed husband that excessive alcohol use could cause ulcers and that it could be contributing to her high BP. Husband asked what he should do if after they left the pt refused to go to the ED. RN reviewed signs/symptoms to monitor pt for and if she develops any of these symptoms to go to the ED immediately. Pt returned from the bathroom and RN reviewed these symptoms with the pt as well but continued to instruct her that per Dr. Elesa Hacker recommendations she should go to the ED now to be evaluated for her high BP, as it puts her at an increased risk for stroke. Pt and her husband verbalized understanding and stated that they would follow MD's recommendations and also contact her PCP.

## 2021-10-29 NOTE — Telephone Encounter (Signed)
Noted, will evaluate. 

## 2021-10-29 NOTE — Telephone Encounter (Signed)
I spoke with pt;pt last took BP couple wks ago prior to colonoscopy and endoscopy and pt does not remember actual value of BP but pt said BP was normal then. At colonoscopy and endoscopy BP was 145/120 P111 and 197/125 P?. Pt had procedures and after procedures pt was seen at ED where BP was 176/114 P 80. Pt taking olmesartan 20 mg one daily at nighttime.pt has not missed any medication. 10/29/21 BP  at 5:20 AM was 153/96 P?  Today at 8:23 BP 181/120 P ?  And BP today at 9:45 BP 191/122 P 103. Pt had slight H/A on 10/28/21 that went away.  Today no H/A, dizziness, CP,SOB or vision changes. No covid symptoms or known exposure per pt; pt scheduled appt with Allayne Gitelman  10/30/21 at 3:20 that was Oked by Jae Dire. Pt given UC & ED precautions and pt voiced understanding. Sending note to Allayne Gitelman NP and [facility] CMA.

## 2021-10-30 ENCOUNTER — Encounter: Payer: Self-pay | Admitting: Primary Care

## 2021-10-30 ENCOUNTER — Telehealth: Payer: Self-pay | Admitting: *Deleted

## 2021-10-30 ENCOUNTER — Ambulatory Visit (INDEPENDENT_AMBULATORY_CARE_PROVIDER_SITE_OTHER): Payer: No Typology Code available for payment source | Admitting: Primary Care

## 2021-10-30 ENCOUNTER — Other Ambulatory Visit: Payer: Self-pay | Admitting: Primary Care

## 2021-10-30 ENCOUNTER — Telehealth: Payer: Self-pay

## 2021-10-30 ENCOUNTER — Other Ambulatory Visit: Payer: Self-pay

## 2021-10-30 VITALS — BP 188/110 | HR 105 | Temp 98.6°F | Ht 63.0 in | Wt 111.0 lb

## 2021-10-30 DIAGNOSIS — I10 Essential (primary) hypertension: Secondary | ICD-10-CM | POA: Diagnosis not present

## 2021-10-30 DIAGNOSIS — J4520 Mild intermittent asthma, uncomplicated: Secondary | ICD-10-CM

## 2021-10-30 MED ORDER — OLMESARTAN MEDOXOMIL 40 MG PO TABS: 40.0000 mg | ORAL_TABLET | Freq: Every day | ORAL | 0 refills | Status: DC

## 2021-10-30 NOTE — Patient Instructions (Signed)
We increase the dose of your olmesartan to 40 mg daily.  You may take 2 of the 20 mg olmesartan tablets until your bottle is empty.  I sent a new prescription for olmesartan 40 mg to your pharmacy, only take 1 of these once daily.  Continue to monitor your blood pressure as discussed.  Try to avoid excessively salty foods (frozen, canned, fast/fried).  Be sure that you are hydrating well with water.  See you in a few weeks!

## 2021-10-30 NOTE — Telephone Encounter (Signed)
Left message on follow up call. 

## 2021-10-30 NOTE — Telephone Encounter (Signed)
No answer on second attempt follow up call.  ? ?

## 2021-10-30 NOTE — Progress Notes (Signed)
Subjective:    Patient ID: Kelly Walton, female    DOB: 12-Dec-1970, 51 y.o.   MRN: [id number]  HPI  Kelly Walton is a very pleasant 51 y.o. female with a history of hypertension, asthma, hyperlipidemia, elevated LFTs, alcohol abuse, diarrhea who presents today for evaluation of hypertension and ED follow-up.  She presented to [facility] ED on 10/28/2021 per GI for hypertension after completing an endoscopy and colonoscopy.  Her blood pressure was noted to be 145/120, 197/125.  She was provided with IV labetalol in the endoscopy center. Currently managed on olmesartan 20 mg daily for hypertension and endorsed compliance.  During her ED visit she was asymptomatic.  Her blood pressure was noted to be elevated.  She underwent lab work including troponin, CBC, CMP all of which were without acute findings.  She was discharged home with recommendations to double her olmesartan to 40 mg if her blood pressure remained above [id number] systolic and/or [id number] diastolic.  Since her ED visit she's checking at home which is running 140's-190's/low 100's. She did take an extra olmesartan yesterday afternoon and her usual dose last night.   She denies headaches, chest pain, dizziness.   BP Readings from Last 3 Encounters:  10/30/21 (!) 188/110  10/28/21 (!) 182/122  10/28/21 (!) 197/125      Review of Systems  Eyes:  Negative for visual disturbance.  Respiratory:  Negative for shortness of breath.   Cardiovascular:  Negative for chest pain.  Neurological:  Negative for dizziness and headaches.        Past Medical History:  Diagnosis Date   AC separation, right, initial encounter    Acute conjunctivitis of left eye 12/27/2020   Acute right lower quadrant pain 06/10/2018   Alcohol abuse    Anxiety    Arthritis    Asthma    Hypertension    Insomnia    Pain of upper abdomen 06/21/2017   Palpitations    a. 05/2019 Echo: EF 60-65%, diast dysfxn; b. 05/2019 Zio: Sinus rhythm-sinus tach. Avg HR 105. 6 SVT  episodes - longest 7 beats. Rare PVCs-->Diltiazem added.   Periumbilical abdominal pain 03/12/2020   Spongiotic dermatitis     Social History   Socioeconomic History   Marital status: Single    Spouse name: Not on file   Number of children: 2   Years of education: Not on file   Highest education level: Not on file  Occupational History   Occupation: Best boy: [employer]  Tobacco Use   Smoking status: Never   Smokeless tobacco: Never  Vaping Use   Vaping Use: Never used  Substance and Sexual Activity   Alcohol use: Yes    Comment: 2-3 per week   Drug use: No   Sexual activity: Not on file  Other Topics Concern   Not on file  Social History Narrative   Single.   2 children, 1 step-son.   Manager at [employer].   Enjoys going to [employer], spending time with family.    Social Determinants of Health   Financial Resource Strain: Not on file  Food Insecurity: Not on file  Transportation Needs: Not on file  Physical Activity: Not on file  Stress: Not on file  Social Connections: Not on file  Intimate Partner Violence: Not on file    Past Surgical History:  Procedure Laterality Date   CESAREAN SECTION     FOOT FRACTURE SURGERY Right    ORIF CLAVICULAR FRACTURE Right 06/17/2020  Procedure: OPEN REDUCTION INTERNAL FIXATION (ORIF) RIGHT CLAVICLE FRACTURE, ACROMIOCLAVICULAR SEPARATION;  Surgeon: Leandrew Koyanagi, MD;  Location: [facility];  Service: Orthopedics;  Laterality: Right;   ORIF CLAVICULAR FRACTURE Right 07/05/2020   Procedure: REVISION OPEN REDUCTION INTERNAL FIXATION (ORIF) RIGHT CLAVICLE FRACTURE, ACROMIOCLAVICULAR SEPARATION;  Surgeon: Leandrew Koyanagi, MD;  Location: [facility];  Service: Orthopedics;  Laterality: Right;   PERCUTANEOUS PINNING PHALANX FRACTURE OF HAND Right    ROBOTIC ASSISTED TOTAL HYSTERECTOMY N/A 03/05/2014   Procedure: ROBOTIC ASSISTED TOTAL HYSTERECTOMY/BILATERAL SALPINGECTOMY;  Surgeon: Marvene Staff, MD;  Location: [facility];   Service: Gynecology;  Laterality: N/A;   TUBAL LIGATION      Family History  Problem Relation Age of Onset   Arthritis Mother    Kidney disease Mother    Arthritis Father    Lung cancer Father    Heart attack Father    Lymphoma Father    Diabetes Father    Epilepsy Brother    Breast cancer Maternal Aunt    Hyperlipidemia Maternal Grandmother    Stroke Maternal Grandmother    Hypertension Maternal Grandmother    Heart attack Maternal Grandmother    Diabetes Maternal Grandmother    Alcohol abuse Paternal Grandmother    Arthritis Paternal Grandmother    Mental illness Paternal Grandmother    Lupus Son    Colon cancer Neg Hx    Esophageal cancer Neg Hx    Stomach cancer Neg Hx    Rectal cancer Neg Hx     Allergies  Allergen Reactions   Phenergan [Promethazine Hcl] Hives and Rash   Sulfa Antibiotics Hives and Rash    Pt states sent her to the hospital.   Trazodone And Nefazodone     Hallucinations     Current Outpatient Medications on File Prior to Visit  Medication Sig Dispense Refill   albuterol (VENTOLIN HFA) 108 (90 Base) MCG/ACT inhaler TAKE 2 PUFFS BY MOUTH EVERY 6 HOURS AS NEEDED FOR WHEEZE OR SHORTNESS OF BREATH 6.7 each 0   esomeprazole (NEXIUM) 40 MG packet Take 40 mg by mouth in the morning and at bedtime. 60 each 6   fluticasone (FLOVENT HFA) 110 MCG/ACT inhaler Inhale 1 puff into the lungs in the morning and at bedtime. Rinse mouth after each use. 3 each 1   hydrOXYzine (ATARAX) 10 MG tablet TAKE 1 TABLET (10 MG TOTAL) BY MOUTH AT BEDTIME AS NEEDED FOR ANXIETY. 90 tablet 0   meclizine (ANTIVERT) 12.5 MG tablet Take 1 tablet (12.5 mg total) by mouth 3 (three) times daily as needed for dizziness. 30 tablet 0   montelukast (SINGULAIR) 10 MG tablet TAKE 1 TABLET (10 MG TOTAL) BY MOUTH AT BEDTIME. FOR ALLERGIES/ASTHMA. 90 tablet 3   ondansetron (ZOFRAN) 4 MG tablet Take 1 tablet (4 mg total) by mouth every 8 (eight) hours as needed for nausea or vomiting. 30 tablet 2    sertraline (ZOLOFT) 50 MG tablet TAKE 1 TABLET (50 MG TOTAL) BY MOUTH AT BEDTIME. FOR ANXIETY. 90 tablet 3   tiZANidine (ZANAFLEX) 2 MG tablet TAKE 1-2 TABLETS (2-4 MG TOTAL) BY MOUTH EVERY 6 (SIX) HOURS AS NEEDED FOR MUSCLE SPASMS. 720 tablet 2   No current facility-administered medications on file prior to visit.    BP (!) 188/110    Pulse (!) 105    Temp 98.6 F (37 C) (Temporal)    Ht 5\' 3"  (1.6 m)    Wt 111 lb (50.3 kg)    LMP 03/05/2014  Comment: LMP x 56yrs ago    SpO2 97%    BMI 19.66 kg/m  Objective:   Physical Exam Cardiovascular:     Rate and Rhythm: Normal rate and regular rhythm.  Pulmonary:     Effort: Pulmonary effort is normal.     Breath sounds: Normal breath sounds.  Musculoskeletal:     Cervical back: Neck supple.  Skin:    General: Skin is warm and dry.  Psychiatric:        Mood and Affect: Mood normal.          Assessment & Plan:      This visit occurred during the SARS-CoV-2 public health emergency.  Safety protocols were in place, including screening questions prior to the visit, additional usage of staff PPE, and extensive cleaning of exam room while observing appropriate contact time as indicated for disinfecting solutions.

## 2021-10-30 NOTE — Assessment & Plan Note (Signed)
Very odd escalation in blood pressure readings, especially compared to our visit last month.  Increase olmesartan to 40 mg daily, new prescription sent to pharmacy.  She will continue to track her blood pressure readings and will follow-up with [place] in 2 to 3 weeks.  If her blood pressure readings remain elevated and she develops new symptoms, she was instructed to call.  ED notes, labs reviewed.

## 2021-11-04 ENCOUNTER — Other Ambulatory Visit: Payer: Self-pay

## 2021-11-04 ENCOUNTER — Encounter: Payer: Self-pay | Admitting: Gastroenterology

## 2021-11-04 DIAGNOSIS — K529 Noninfective gastroenteritis and colitis, unspecified: Secondary | ICD-10-CM

## 2021-11-04 DIAGNOSIS — R112 Nausea with vomiting, unspecified: Secondary | ICD-10-CM

## 2021-11-04 DIAGNOSIS — R634 Abnormal weight loss: Secondary | ICD-10-CM

## 2021-11-04 NOTE — Progress Notes (Signed)
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  We will plan to hold on getting it done for now.

## 2021-11-10 ENCOUNTER — Other Ambulatory Visit: Payer: No Typology Code available for payment source

## 2021-11-10 DIAGNOSIS — K529 Noninfective gastroenteritis and colitis, unspecified: Secondary | ICD-10-CM

## 2021-11-10 DIAGNOSIS — R112 Nausea with vomiting, unspecified: Secondary | ICD-10-CM

## 2021-11-10 DIAGNOSIS — R634 Abnormal weight loss: Secondary | ICD-10-CM

## 2021-11-12 ENCOUNTER — Encounter: Payer: Self-pay | Admitting: Gastroenterology

## 2021-11-13 ENCOUNTER — Other Ambulatory Visit: Payer: Self-pay

## 2021-11-13 ENCOUNTER — Telehealth: Payer: Self-pay | Admitting: Gastroenterology

## 2021-11-13 DIAGNOSIS — K529 Noninfective gastroenteritis and colitis, unspecified: Secondary | ICD-10-CM

## 2021-11-13 DIAGNOSIS — R112 Nausea with vomiting, unspecified: Secondary | ICD-10-CM

## 2021-11-13 LAB — H PYLORI, IGM, IGG, IGA AB
H pylori, IgM Abs: 9.5 units — ABNORMAL HIGH (ref 0.0–8.9)
H. pylori, IgA Abs: <9 units (ref 0.0–8.9)
H. pylori, IgG AbS: 0.44 Index Value (ref 0.00–0.79)

## 2021-11-13 LAB — IGA: Immunoglobulin A: 120 mg/dL (ref 47–310)

## 2021-11-13 LAB — TISSUE TRANSGLUTAMINASE, IGA: (tTG) Ab, IgA: <1 U/mL

## 2021-11-13 LAB — ANA: Anti Nuclear Antibody (ANA): NEGATIVE

## 2021-11-13 NOTE — Telephone Encounter (Signed)
Patient is returning your call.  

## 2021-11-13 NOTE — Telephone Encounter (Signed)
See result note.  

## 2021-11-18 ENCOUNTER — Encounter: Payer: Self-pay | Admitting: Gastroenterology

## 2021-11-19 ENCOUNTER — Ambulatory Visit: Payer: No Typology Code available for payment source | Admitting: Primary Care

## 2021-12-01 ENCOUNTER — Ambulatory Visit: Payer: No Typology Code available for payment source | Admitting: Nurse Practitioner

## 2021-12-05 ENCOUNTER — Telehealth: Payer: Self-pay | Admitting: Gastroenterology

## 2021-12-05 NOTE — Telephone Encounter (Signed)
Left message for patient to return my call to schedule follow up appointment with Dr. Meridee Score. Patient did not show to 12/01/21 appointment with Willette Cluster, NP. ? ?Patient needs to schedule appointment with Dr. Meridee Score and needs to keep appointment when scheduled. ?

## 2022-01-02 ENCOUNTER — Other Ambulatory Visit: Payer: Self-pay | Admitting: Primary Care

## 2022-01-02 DIAGNOSIS — F411 Generalized anxiety disorder: Secondary | ICD-10-CM

## 2022-01-09 ENCOUNTER — Other Ambulatory Visit: Payer: Self-pay | Admitting: Primary Care

## 2022-01-09 DIAGNOSIS — I10 Essential (primary) hypertension: Secondary | ICD-10-CM

## 2022-02-05 ENCOUNTER — Encounter: Payer: No Typology Code available for payment source | Admitting: Primary Care

## 2022-02-20 ENCOUNTER — Ambulatory Visit: Payer: No Typology Code available for payment source | Admitting: Physician Assistant

## 2022-03-11 ENCOUNTER — Other Ambulatory Visit: Payer: Self-pay | Admitting: Primary Care

## 2022-03-11 DIAGNOSIS — F411 Generalized anxiety disorder: Secondary | ICD-10-CM

## 2022-03-11 NOTE — Telephone Encounter (Signed)
I would like to see patient for general follow up in August. Please schedule.

## 2022-03-17 NOTE — Telephone Encounter (Signed)
Called and LVM for patient to give a callback for f/u appointment.

## 2022-03-23 NOTE — Telephone Encounter (Signed)
Left message on VM per DPR to call the office to schedule a follow-up in August.

## 2022-03-25 ENCOUNTER — Encounter: Payer: Self-pay | Admitting: Gastroenterology

## 2022-03-25 ENCOUNTER — Other Ambulatory Visit: Payer: Self-pay | Admitting: Primary Care

## 2022-03-25 DIAGNOSIS — J4520 Mild intermittent asthma, uncomplicated: Secondary | ICD-10-CM

## 2022-03-25 DIAGNOSIS — F411 Generalized anxiety disorder: Secondary | ICD-10-CM

## 2022-03-25 DIAGNOSIS — I10 Essential (primary) hypertension: Secondary | ICD-10-CM

## 2022-03-26 NOTE — Telephone Encounter (Signed)
Patient is due for CPE/follow up, this will be required prior to any further refills.  Please schedule.   

## 2022-03-27 NOTE — Telephone Encounter (Signed)
CPE made with patient for 8/1. Will call if any problems.

## 2022-03-31 ENCOUNTER — Telehealth: Payer: Self-pay

## 2022-03-31 DIAGNOSIS — J4520 Mild intermittent asthma, uncomplicated: Secondary | ICD-10-CM

## 2022-03-31 MED ORDER — PULMICORT FLEXHALER 90 MCG/ACT IN AEPB: 1.0000 | INHALATION_SPRAY | Freq: Two times a day (BID) | RESPIRATORY_TRACT | 0 refills | Status: DC

## 2022-03-31 NOTE — Telephone Encounter (Signed)
-----   Message from Ronalee Red, RT sent at 03/26/2022 12:54 PM EDT ----- Regarding: Prior auth I am working on a prior auth for Fluticasone Propionate HFA 110MCG/ACT aerosol for this patient.  When it went to clinical questions, it says:    The patient's drug benefit plan provides coverage for other drugs which may be considered for treating your patient. Can your patient be treated with a formulary drug? Available Formulary Alternative: Pulmicort Flexhaler [If yes, provide your patient with a new prescription for the formulary product.]  Yes or No  Should I go ahead and complete the PA for Fluticasone Propionate?  Thank you,  Jae Dire

## 2022-03-31 NOTE — Telephone Encounter (Signed)
Sure, we can try the Pulmicort Flexhaler. Please update the patient regarding this conversation, let her know that we will be switching to the Pulmicort inhaler.

## 2022-03-31 NOTE — Addendum Note (Signed)
Addended by: Doreene Nest on: 03/31/2022 01:50 PM   Modules accepted: Orders

## 2022-04-01 NOTE — Telephone Encounter (Signed)
I spoke to the pharmacy and they said a PA is not required for the new prescription, Budesonide (Pulmicort Flexhaler).  They have the prescription ready for patient to pick up.  I also called patient to notify her why [name] changed her medication and that it is ready for pickup.  She understands.

## 2022-04-07 ENCOUNTER — Encounter: Payer: Self-pay | Admitting: Primary Care

## 2022-04-07 ENCOUNTER — Ambulatory Visit (INDEPENDENT_AMBULATORY_CARE_PROVIDER_SITE_OTHER): Payer: No Typology Code available for payment source | Admitting: Primary Care

## 2022-04-07 VITALS — BP 130/86 | HR 86 | Temp 98.0°F | Ht 63.5 in | Wt 102.0 lb

## 2022-04-07 DIAGNOSIS — Z0000 Encounter for general adult medical examination without abnormal findings: Secondary | ICD-10-CM | POA: Diagnosis not present

## 2022-04-07 DIAGNOSIS — I10 Essential (primary) hypertension: Secondary | ICD-10-CM | POA: Diagnosis not present

## 2022-04-07 DIAGNOSIS — F411 Generalized anxiety disorder: Secondary | ICD-10-CM

## 2022-04-07 DIAGNOSIS — D72819 Decreased white blood cell count, unspecified: Secondary | ICD-10-CM

## 2022-04-07 DIAGNOSIS — F1010 Alcohol abuse, uncomplicated: Secondary | ICD-10-CM

## 2022-04-07 DIAGNOSIS — J4520 Mild intermittent asthma, uncomplicated: Secondary | ICD-10-CM | POA: Diagnosis not present

## 2022-04-07 DIAGNOSIS — Z1231 Encounter for screening mammogram for malignant neoplasm of breast: Secondary | ICD-10-CM

## 2022-04-07 DIAGNOSIS — E785 Hyperlipidemia, unspecified: Secondary | ICD-10-CM

## 2022-04-07 DIAGNOSIS — K219 Gastro-esophageal reflux disease without esophagitis: Secondary | ICD-10-CM

## 2022-04-07 DIAGNOSIS — G4700 Insomnia, unspecified: Secondary | ICD-10-CM

## 2022-04-07 DIAGNOSIS — R7989 Other specified abnormal findings of blood chemistry: Secondary | ICD-10-CM

## 2022-04-07 LAB — HEMOGLOBIN A1C: Hgb A1c MFr Bld: 5.3 % (ref 4.6–6.5)

## 2022-04-07 LAB — LIPID PANEL
Cholesterol: 270 mg/dL — ABNORMAL HIGH (ref 0–200)
HDL: 140.8 mg/dL (ref >39.00)
LDL Cholesterol: 90 mg/dL (ref 0–99)
NonHDL: 129.06
Total CHOL/HDL Ratio: 2
Triglycerides: 194 mg/dL — ABNORMAL HIGH (ref 0.0–149.0)
VLDL: 38.8 mg/dL (ref 0.0–40.0)

## 2022-04-07 LAB — COMPREHENSIVE METABOLIC PANEL
ALT: 21 U/L (ref 0–35)
AST: 29 U/L (ref 0–37)
Albumin: 4 g/dL (ref 3.5–5.2)
Alkaline Phosphatase: 102 U/L (ref 39–117)
BUN: 7 mg/dL (ref 6–23)
CO2: 27 mEq/L (ref 19–32)
Calcium: 8.6 mg/dL (ref 8.4–10.5)
Chloride: 104 mEq/L (ref 96–112)
Creatinine, Ser: 0.88 mg/dL (ref 0.40–1.20)
GFR: 76.8 mL/min (ref >60.00)
Glucose, Bld: 84 mg/dL (ref 70–99)
Potassium: 3.2 mEq/L — ABNORMAL LOW (ref 3.5–5.1)
Sodium: 142 mEq/L (ref 135–145)
Total Bilirubin: 0.4 mg/dL (ref 0.2–1.2)
Total Protein: 6.4 g/dL (ref 6.0–8.3)

## 2022-04-07 LAB — CBC
HCT: 31.4 % — ABNORMAL LOW (ref 36.0–46.0)
Hemoglobin: 10.7 g/dL — ABNORMAL LOW (ref 12.0–15.0)
MCHC: 34 g/dL (ref 30.0–36.0)
MCV: 97.5 fl (ref 78.0–100.0)
Platelets: 185 10*3/uL (ref 150.0–400.0)
RBC: 3.22 Mil/uL — ABNORMAL LOW (ref 3.87–5.11)
RDW: 16 % — ABNORMAL HIGH (ref 11.5–15.5)
WBC: 3.2 10*3/uL — ABNORMAL LOW (ref 4.0–10.5)

## 2022-04-07 LAB — TSH: TSH: 0.34 u[IU]/mL — ABNORMAL LOW (ref 0.35–5.50)

## 2022-04-07 NOTE — Assessment & Plan Note (Signed)
Controlled. ?Continue Nexium 40 mg daily. ?

## 2022-04-07 NOTE — Progress Notes (Signed)
Subjective:    Patient ID: Kelly Walton, female    DOB: October 01, 1970, 51 y.o.   MRN: [id number]  HPI  Kelly Walton is a very pleasant 51 y.o. female who presents today for complete physical and follow up of chronic conditions.  Immunizations: -Tetanus: 2016 -Influenza: Due this season  -Covid-19: 2 vaccines -Shingles: Never completed, declines today -Pneumonia: 2016  Diet: Fair diet.  Exercise: No regular exercise.  Eye exam: Completes annually  Dental exam: Completes semi-annually   Pap Smear: Hysterectomy  Mammogram: No recent mammogram  Colonoscopy: Completed in 2023, due 2033  BP Readings from Last 3 Encounters:  04/07/22 130/86  10/30/21 (!) 188/110  10/28/21 (!) 182/122    Wt Readings from Last 3 Encounters:  04/07/22 102 lb (46.3 kg)  10/30/21 111 lb (50.3 kg)  10/28/21 109 lb (49.4 kg)       Review of Systems  Constitutional:  Negative for unexpected weight change.  HENT:  Negative for rhinorrhea.   Respiratory:  Negative for cough and shortness of breath.   Cardiovascular:  Negative for chest pain.  Gastrointestinal:  Negative for constipation and diarrhea.  Genitourinary:  Negative for difficulty urinating.  Musculoskeletal:  Negative for arthralgias and myalgias.  Skin:  Negative for rash.  Allergic/Immunologic: Positive for environmental allergies.  Neurological:  Positive for dizziness. Negative for headaches.  Psychiatric/Behavioral:  The patient is not nervous/anxious.          Past Medical History:  Diagnosis Date   AC separation, right, initial encounter    Acute conjunctivitis of left eye 12/27/2020   Acute right lower quadrant pain 06/10/2018   Alcohol abuse    Anxiety    Arthritis    Asthma    Hypertension    Insomnia    Pain of upper abdomen 06/21/2017   Palpitations    a. 05/2019 Echo: EF 60-65%, diast dysfxn; b. 05/2019 Zio: Sinus rhythm-sinus tach. Avg HR 105. 6 SVT episodes - longest 7 beats. Rare PVCs-->Diltiazem  added.   Periumbilical abdominal pain 03/12/2020   Spongiotic dermatitis     Social History   Socioeconomic History   Marital status: Single    Spouse name: Not on file   Number of children: 2   Years of education: Not on file   Highest education level: Not on file  Occupational History   Occupation: Event organiser: [employer]  Tobacco Use   Smoking status: Never   Smokeless tobacco: Never  Vaping Use   Vaping Use: Never used  Substance and Sexual Activity   Alcohol use: Yes    Comment: 2-3 per week   Drug use: No   Sexual activity: Not on file  Other Topics Concern   Not on file  Social History Narrative   Single.   2 children, 1 step-son.   Manager at [employer].   Enjoys going to [employer], spending time with family.    Social Determinants of Health   Financial Resource Strain: Not on file  Food Insecurity: Not on file  Transportation Needs: Not on file  Physical Activity: Not on file  Stress: Not on file  Social Connections: Not on file  Intimate Partner Violence: Not on file    Past Surgical History:  Procedure Laterality Date   CESAREAN SECTION     FOOT FRACTURE SURGERY Right    ORIF CLAVICULAR FRACTURE Right 06/17/2020   Procedure: OPEN REDUCTION INTERNAL FIXATION (ORIF) RIGHT CLAVICLE FRACTURE, ACROMIOCLAVICULAR SEPARATION;  Surgeon: Tarry Kos, MD;  Location: [facility];  Service: Orthopedics;  Laterality: Right;   ORIF CLAVICULAR FRACTURE Right 07/05/2020   Procedure: REVISION OPEN REDUCTION INTERNAL FIXATION (ORIF) RIGHT CLAVICLE FRACTURE, ACROMIOCLAVICULAR SEPARATION;  Surgeon: Tarry Kos, MD;  Location: [facility];  Service: Orthopedics;  Laterality: Right;   PERCUTANEOUS PINNING PHALANX FRACTURE OF HAND Right    ROBOTIC ASSISTED TOTAL HYSTERECTOMY N/A 03/05/2014   Procedure: ROBOTIC ASSISTED TOTAL HYSTERECTOMY/BILATERAL SALPINGECTOMY;  Surgeon: Serita Kyle, MD;  Location: [facility];  Service: Gynecology;  Laterality: N/A;   TUBAL LIGATION       Family History  Problem Relation Age of Onset   Arthritis Mother    Kidney disease Mother    Arthritis Father    Lung cancer Father    Heart attack Father    Lymphoma Father    Diabetes Father    Epilepsy Brother    Breast cancer Maternal Aunt    Hyperlipidemia Maternal Grandmother    Stroke Maternal Grandmother    Hypertension Maternal Grandmother    Heart attack Maternal Grandmother    Diabetes Maternal Grandmother    Alcohol abuse Paternal Grandmother    Arthritis Paternal Grandmother    Mental illness Paternal Grandmother    Lupus Son    Colon cancer Neg Hx    Esophageal cancer Neg Hx    Stomach cancer Neg Hx    Rectal cancer Neg Hx     Allergies  Allergen Reactions   Phenergan [Promethazine Hcl] Hives and Rash   Sulfa Antibiotics Hives and Rash    Pt states sent her to the hospital.   Trazodone And Nefazodone     Hallucinations     Current Outpatient Medications on File Prior to Visit  Medication Sig Dispense Refill   albuterol (VENTOLIN HFA) 108 (90 Base) MCG/ACT inhaler TAKE 2 PUFFS BY MOUTH EVERY 6 HOURS AS NEEDED FOR WHEEZE OR SHORTNESS OF BREATH 6.7 each 0   esomeprazole (NEXIUM) 40 MG packet Take 40 mg by mouth in the morning and at bedtime. 60 each 6   hydrOXYzine (ATARAX) 10 MG tablet Take 1 tablet (10 mg total) by mouth at bedtime as needed for anxiety. Office visit required for further refills. 90 tablet 0   meclizine (ANTIVERT) 12.5 MG tablet Take 1 tablet (12.5 mg total) by mouth 3 (three) times daily as needed for dizziness. 30 tablet 0   montelukast (SINGULAIR) 10 MG tablet TAKE 1 TABLET (10 MG TOTAL) BY MOUTH AT BEDTIME. FOR ALLERGIES/ASTHMA. 90 tablet 3   olmesartan (BENICAR) 40 MG tablet Take 1 tablet (40 mg total) by mouth daily. For blood pressure. 90 tablet 0   ondansetron (ZOFRAN) 4 MG tablet Take 1 tablet (4 mg total) by mouth every 8 (eight) hours as needed for nausea or vomiting. 30 tablet 2   sertraline (ZOLOFT) 50 MG tablet TAKE 1  TABLET (50 MG TOTAL) BY MOUTH AT BEDTIME. FOR ANXIETY. 90 tablet 0   Budesonide (PULMICORT FLEXHALER) 90 MCG/ACT inhaler Inhale 1-2 puffs into the lungs 2 (two) times daily. (Patient not taking: Reported on 04/07/2022) 1 each 0   No current facility-administered medications on file prior to visit.    BP 130/86   Pulse 86   Temp 98 F (36.7 C) (Oral)   Ht 5' 3.5" (1.613 m)   Wt 102 lb (46.3 kg)   LMP 03/05/2014 Comment: LMP x 22yrs ago   SpO2 97%   BMI 17.79 kg/m  Objective:   Physical Exam HENT:  Right Ear: Tympanic membrane and ear canal normal.     Left Ear: Tympanic membrane and ear canal normal.     Nose: Nose normal.  Eyes:     Conjunctiva/sclera: Conjunctivae normal.     Pupils: Pupils are equal, round, and reactive to light.  Neck:     Thyroid: No thyromegaly.  Cardiovascular:     Rate and Rhythm: Normal rate and regular rhythm.     Heart sounds: No murmur heard. Pulmonary:     Effort: Pulmonary effort is normal.     Breath sounds: Normal breath sounds. No rales.  Abdominal:     General: Bowel sounds are normal.     Palpations: Abdomen is soft.     Tenderness: There is no abdominal tenderness.  Musculoskeletal:        General: Normal range of motion.     Cervical back: Neck supple.  Lymphadenopathy:     Cervical: No cervical adenopathy.  Skin:    General: Skin is warm and dry.     Findings: No rash.  Neurological:     Mental Status: She is alert and oriented to person, place, and time.     Cranial Nerves: No cranial nerve deficit.     Deep Tendon Reflexes: Reflexes are normal and symmetric.  Psychiatric:        Mood and Affect: Mood normal.           Assessment & Plan:   Problem List Items Addressed This Visit       Cardiovascular and Mediastinum   Essential hypertension    Overall controlled.  Continue olmesartan 40 mg daily. CMP pending.      Relevant Orders   Comprehensive metabolic panel   CBC   TSH   Hemoglobin A1c      Respiratory   Asthma    Overall controlled.  Continue Flovent 110 mcg BID as she had extra.  She will switch to the Pulmicort 90 mcg BID. Uses seasonally.  Continue albuterol inhaler.  Continue Singulair 10 mg daily. Takes year round        Digestive   GERD (gastroesophageal reflux disease)    Controlled.  Continue Nexium 40 mg daily.        Other   Insomnia    Chronic, continued.  Alcoholism likely contributing.  Continue hydroxyzine 10 mg PRN HS.      Preventative health care - Primary    Shingrix due, she declines today. Other vaccines UTD Mammogram overdue, orders placed. Colonoscopy UTD, due 2033.  Discussed the importance of a healthy diet and regular exercise in order for weight loss, and to reduce the risk of further co-morbidity.  Exam stable. Labs pending.  Follow up in 1 year for repeat physical.       Hyperlipidemia    Repeat lipid panel pending. Not currently on treatment.      Relevant Orders   Lipid panel   Elevated LFTs    Secondary to recurrent alcohol abuse. Repeat LFT's pending.       GAD (generalized anxiety disorder)    Controlled per patient. Continue sertraline 50 mg daily.  Continue hydroxyzine 10 mg HS.   Caution provided regarding use of SSRI and heavy alcohol use.      Alcohol abuse    Chronic, patient denies frequent alcohol use, however her boyfriend (on [place]) informed us last week that she has resumed daily alcohol consumption to the point of frequent intoxication.   She continues to deny this to  me. Discussed consequences of recurrent and heavy alcohol use.  Checking LFT's and other labs today.      Other Visit Diagnoses     Encounter for screening mammogram for malignant neoplasm of breast       Relevant Orders   MM 3D SCREEN BREAST BILATERAL          Doreene Nest, NP

## 2022-04-07 NOTE — Assessment & Plan Note (Signed)
Repeat lipid panel pending.  Not currently on treatment. 

## 2022-04-07 NOTE — Assessment & Plan Note (Signed)
Chronic, patient denies frequent alcohol use, however her boyfriend (on [place]) informed us last week that she has resumed daily alcohol consumption to the point of frequent intoxication.   She continues to deny this to me. Discussed consequences of recurrent and heavy alcohol use.  Checking LFT's and other labs today.

## 2022-04-07 NOTE — Assessment & Plan Note (Addendum)
Controlled per patient. Continue sertraline 50 mg daily.  Continue hydroxyzine 10 mg HS.   Caution provided regarding use of SSRI and heavy alcohol use.

## 2022-04-07 NOTE — Patient Instructions (Signed)
Stop by the lab prior to leaving today. I will notify you of your results once received.  ? ?[facility] to schedule your mammogram.  ? ?It was a pleasure to see you today! ? ?Preventive Care 40-50 Years Old, Female ?Preventive care refers to lifestyle choices and visits with your health care provider that can promote health and wellness. Preventive care visits are also called wellness exams. ?What can I expect for my preventive care visit? ?Counseling ?Your health care provider may ask you questions about your: ?Medical history, including: ?Past medical problems. ?Family medical history. ?Pregnancy history. ?Current health, including: ?Menstrual cycle. ?Method of birth control. ?Emotional well-being. ?Home life and relationship well-being. ?Sexual activity and sexual health. ?Lifestyle, including: ?Alcohol, nicotine or tobacco, and drug use. ?Access to firearms. ?Diet, exercise, and sleep habits. ?Work and work environment. ?Sunscreen use. ?Safety issues such as seatbelt and bike helmet use. ?Physical exam ?Your health care provider will check your: ?Height and weight. These may be used to calculate your BMI (body mass index). BMI is a measurement that tells if you are at a healthy weight. ?Waist circumference. This measures the distance around your waistline. This measurement also tells if you are at a healthy weight and may help predict your risk of certain diseases, such as type 2 diabetes and high blood pressure. ?Heart rate and blood pressure. ?Body temperature. ?Skin for abnormal spots. ?What immunizations do I need? ? ?Vaccines are usually given at various ages, according to a schedule. Your health care provider will recommend vaccines for you based on your age, medical history, and lifestyle or other factors, such as travel or where you work. ?What tests do I need? ?Screening ?Your health care provider may recommend screening tests for certain conditions. This may include: ?Lipid and cholesterol  levels. ?Diabetes screening. This is done by checking your blood sugar (glucose) after you have not eaten for a while (fasting). ?Pelvic exam and Pap test. ?Hepatitis B test. ?Hepatitis C test. ?HIV (human immunodeficiency virus) test. ?STI (sexually transmitted infection) testing, if you are at risk. ?Lung cancer screening. ?Colorectal cancer screening. ?Mammogram. Talk with your health care provider about when you should start having regular mammograms. This may depend on whether you have a family history of breast cancer. ?BRCA-related cancer screening. This may be done if you have a family history of breast, ovarian, tubal, or peritoneal cancers. ?Bone density scan. This is done to screen for osteoporosis. ?Talk with your health care provider about your test results, treatment options, and if necessary, the need for more tests. ?Follow these instructions at home: ?Eating and drinking ? ?Eat a diet that includes fresh fruits and vegetables, whole grains, lean protein, and low-fat dairy products. ?Take vitamin and mineral supplements as recommended by your health care provider. ?Do not drink alcohol if: ?Your health care provider tells you not to drink. ?You are pregnant, may be pregnant, or are planning to become pregnant. ?If you drink alcohol: ?Limit how much you have to 0-1 drink a day. ?Know how much alcohol is in your drink. In the [place] one drink equals one 12 oz bottle of beer (355 mL), one 5 oz glass of wine (148 mL), or one 1? oz glass of hard liquor (44 mL). ?Lifestyle ?Brush your teeth every morning and night with fluoride toothpaste. Floss one time each day. ?Exercise for at least 30 minutes 5 or more days each week. ?Do not use any products that contain nicotine or tobacco. These products include cigarettes, chewing   tobacco, and vaping devices, such as e-cigarettes. If you need help quitting, ask your health care provider. Do not use drugs. If you are sexually active, practice safe sex. Use a  condom or other form of protection to prevent STIs. If you do not wish to become pregnant, use a form of birth control. If you plan to become pregnant, see your health care provider for a prepregnancy visit. Take aspirin only as told by your health care provider. Make sure that you understand how much to take and what form to take. Work with your health care provider to find out whether it is safe and beneficial for you to take aspirin daily. Find healthy ways to manage stress, such as: Meditation, yoga, or listening to music. Journaling. Talking to a trusted person. Spending time with friends and family. Minimize exposure to UV radiation to reduce your risk of skin cancer. Safety Always wear your seat belt while driving or riding in a vehicle. Do not drive: If you have been drinking alcohol. Do not ride with someone who has been drinking. When you are tired or distracted. While texting. If you have been using any mind-altering substances or drugs. Wear a helmet and other protective equipment during sports activities. If you have firearms in your house, make sure you follow all gun safety procedures. Seek help if you have been physically or sexually abused. What's next? Visit your health care provider once a year for an annual wellness visit. Ask your health care provider how often you should have your eyes and teeth checked. Stay up to date on all vaccines. This information is not intended to replace advice given to you by your health care provider. Make sure you discuss any questions you have with your health care provider. Document Revised: 02/19/2021 Document Reviewed: 02/19/2021 Elsevier Patient Education  [place].

## 2022-04-07 NOTE — Assessment & Plan Note (Signed)
Shingrix due, she declines today. Other vaccines UTD Mammogram overdue, orders placed. Colonoscopy UTD, due 2033.  Discussed the importance of a healthy diet and regular exercise in order for weight loss, and to reduce the risk of further co-morbidity.  Exam stable. Labs pending.  Follow up in 1 year for repeat physical.

## 2022-04-07 NOTE — Assessment & Plan Note (Signed)
Overall controlled.  Continue Flovent 110 mcg BID as she had extra.  She will switch to the Pulmicort 90 mcg BID. Uses seasonally.  Continue albuterol inhaler.  Continue Singulair 10 mg daily. Takes year round

## 2022-04-07 NOTE — Assessment & Plan Note (Signed)
Chronic, continued.  Alcoholism likely contributing.  Continue hydroxyzine 10 mg PRN HS.

## 2022-04-07 NOTE — Assessment & Plan Note (Signed)
Secondary to recurrent alcohol abuse. Repeat LFT's pending.

## 2022-04-07 NOTE — Assessment & Plan Note (Addendum)
Overall controlled.  Continue olmesartan 40 mg daily. CMP pending.

## 2022-04-08 ENCOUNTER — Other Ambulatory Visit (INDEPENDENT_AMBULATORY_CARE_PROVIDER_SITE_OTHER): Payer: No Typology Code available for payment source

## 2022-04-08 DIAGNOSIS — D72819 Decreased white blood cell count, unspecified: Secondary | ICD-10-CM

## 2022-04-08 LAB — WHITE CELL DIFFERENTIAL
Basophils Relative: 0.8 % (ref 0.0–3.0)
Eosinophils Relative: 2.8 % (ref 0.0–5.0)
Lymphocytes Relative: 22.4 % (ref 12.0–46.0)
Monocytes Relative: 6.8 % (ref 3.0–12.0)
Neutrophils Relative %: 67.2 % (ref 43.0–77.0)

## 2022-04-08 NOTE — Addendum Note (Signed)
Addended by: Alvina Chou on: 04/08/2022 10:13 AM   Modules accepted: Orders

## 2022-04-09 ENCOUNTER — Other Ambulatory Visit: Payer: Self-pay | Admitting: Primary Care

## 2022-04-09 ENCOUNTER — Telehealth: Payer: Self-pay

## 2022-04-09 DIAGNOSIS — E876 Hypokalemia: Secondary | ICD-10-CM

## 2022-04-09 LAB — PATHOLOGIST SMEAR REVIEW

## 2022-04-09 MED ORDER — POTASSIUM CHLORIDE CRYS ER 20 MEQ PO TBCR: 20.0000 meq | EXTENDED_RELEASE_TABLET | Freq: Two times a day (BID) | ORAL | 0 refills | Status: DC

## 2022-04-09 NOTE — Telephone Encounter (Signed)
-----   Message from Doreene Nest, NP sent at 04/09/2022  6:54 AM EDT ----- Please call patient:  Thyroid level came back abnormal so we need to repeat her thyroid testing.  Lab only appointment when she is available.  Cholesterol is overall okay.  Potassium level is too low, this can be seen with malnutrition.  I will send potassium pills to her pharmacy for her to take twice daily x5 days.  Kidneys, liver look good.  Blood counts are abnormal, this can also be seen with malnutrition, she does have a history of this abnormality in the past.  I am waiting on 1 additional lab test to return regarding her blood counts.  Needs lab only appointment for repeat thyroid and potassium testing in 1 week. I will send potassium to pharmacy now.

## 2022-04-09 NOTE — Telephone Encounter (Signed)
Needs lab only appointment for repeat thyroid and potassium testing in 1 week. Called and lvm for  pt call us back to make this appt.

## 2022-04-14 ENCOUNTER — Telehealth: Payer: Self-pay | Admitting: Primary Care

## 2022-04-14 NOTE — Telephone Encounter (Signed)
See lab results for further documentation.  

## 2022-04-14 NOTE — Telephone Encounter (Signed)
Patient called back in returning a call she received from [place]. Patient stated she is at work and don't get off until 6. She stated feel free to leave a detail voice message if need be. Thank you!

## 2022-04-16 ENCOUNTER — Other Ambulatory Visit: Payer: No Typology Code available for payment source

## 2022-05-04 ENCOUNTER — Other Ambulatory Visit: Payer: Self-pay | Admitting: Primary Care

## 2022-05-04 DIAGNOSIS — J4520 Mild intermittent asthma, uncomplicated: Secondary | ICD-10-CM

## 2022-05-06 ENCOUNTER — Other Ambulatory Visit: Payer: Self-pay | Admitting: Primary Care

## 2022-05-06 DIAGNOSIS — J4520 Mild intermittent asthma, uncomplicated: Secondary | ICD-10-CM

## 2022-05-21 ENCOUNTER — Ambulatory Visit: Payer: No Typology Code available for payment source

## 2022-06-11 ENCOUNTER — Other Ambulatory Visit: Payer: Self-pay | Admitting: Primary Care

## 2022-06-11 DIAGNOSIS — F411 Generalized anxiety disorder: Secondary | ICD-10-CM

## 2022-07-10 ENCOUNTER — Other Ambulatory Visit: Payer: Self-pay | Admitting: Primary Care

## 2022-07-10 DIAGNOSIS — F411 Generalized anxiety disorder: Secondary | ICD-10-CM

## 2022-07-28 NOTE — Telephone Encounter (Signed)
Called and scheduled appointment for 08/04/22 @ 12:20.

## 2022-08-04 ENCOUNTER — Encounter: Payer: Self-pay | Admitting: Primary Care

## 2022-08-04 ENCOUNTER — Ambulatory Visit (INDEPENDENT_AMBULATORY_CARE_PROVIDER_SITE_OTHER): Payer: No Typology Code available for payment source | Admitting: Primary Care

## 2022-08-04 VITALS — BP 176/108 | HR 90 | Temp 97.3°F | Ht 63.5 in | Wt 105.0 lb

## 2022-08-04 DIAGNOSIS — R1012 Left upper quadrant pain: Secondary | ICD-10-CM | POA: Diagnosis not present

## 2022-08-04 DIAGNOSIS — J4520 Mild intermittent asthma, uncomplicated: Secondary | ICD-10-CM

## 2022-08-04 DIAGNOSIS — F411 Generalized anxiety disorder: Secondary | ICD-10-CM | POA: Diagnosis not present

## 2022-08-04 DIAGNOSIS — K429 Umbilical hernia without obstruction or gangrene: Secondary | ICD-10-CM | POA: Insufficient documentation

## 2022-08-04 DIAGNOSIS — F1010 Alcohol abuse, uncomplicated: Secondary | ICD-10-CM | POA: Diagnosis not present

## 2022-08-04 DIAGNOSIS — G4700 Insomnia, unspecified: Secondary | ICD-10-CM

## 2022-08-04 DIAGNOSIS — R61 Generalized hyperhidrosis: Secondary | ICD-10-CM

## 2022-08-04 DIAGNOSIS — R1900 Intra-abdominal and pelvic swelling, mass and lump, unspecified site: Secondary | ICD-10-CM

## 2022-08-04 DIAGNOSIS — I10 Essential (primary) hypertension: Secondary | ICD-10-CM

## 2022-08-04 MED ORDER — ALBUTEROL SULFATE HFA 108 (90 BASE) MCG/ACT IN AERS
INHALATION_SPRAY | RESPIRATORY_TRACT | 0 refills | Status: AC
Start: 2022-08-04 — End: ?

## 2022-08-04 MED ORDER — VENLAFAXINE HCL ER 37.5 MG PO CP24: 37.5000 mg | ORAL_CAPSULE | Freq: Every day | ORAL | 0 refills | Status: DC

## 2022-08-04 MED ORDER — GABAPENTIN 100 MG PO CAPS: ORAL_CAPSULE | ORAL | 0 refills | Status: DC

## 2022-08-04 MED ORDER — OLMESARTAN MEDOXOMIL 40 MG PO TABS: 40.0000 mg | ORAL_TABLET | Freq: Every day | ORAL | 0 refills | Status: DC

## 2022-08-04 NOTE — Assessment & Plan Note (Addendum)
Representative of cyst. Discussed with patient. Reviewed abdominal US from 2021.  Ordered soft tissue abdomen ultrasound which is pending.

## 2022-08-04 NOTE — Assessment & Plan Note (Signed)
Could be a combination of menopause vs chronic alcohol abuse with recent discontinuation.  Treat with venlafaxine ER 37.5 mg daily. Follow up in month.

## 2022-08-04 NOTE — Progress Notes (Signed)
Subjective:    Patient ID: Kelly Walton, female    DOB: 08-01-1971, 51 y.o.   MRN: [id number]  HPI  Kelly Walton is a very pleasant 51 y.o. female with a history of hypertension, asthma, alcohol abuse, elevated LFT's, dizziness, GAD, insomnia who presents today to discuss alcohol withdrawal. She would also like to discuss several other concerns. Her significant other joins [place] today.  1) Alcohol Abuse: Chronic history of alcohol abuse that dates back to the age of 59. She decided to quit "cold [place]" on 07/24/22. Shortly after she developed symptoms of recurrent nausea and vomiting, uncontrolled body shakes/tremors, elevated BP, night sweats, headaches, insomnia, and anorexia.   Most of her symptoms have improved, but she continues to experience night sweats, insomnia, hypertension and mild tremors occurring with fine motor movements. She is no longer vomiting. She initially didn't eat for three days after discontinuing alcohol, but her appetite has returned and she is eating more than before.   She did have 1 beer two days ago as she developed headaches, this helped to improve her headache. She does continue with alcohol cravings and is aware of this. She's trying to prevent relapse by avoiding placing herself in situations where alcohol is served. She did purchase a 12 step book for alcoholism, has been reading the book. She has thought about AA classes.   2) Abdominal Mass: She continues to notice a mass to the left of the umbilical region for which she initially noticed years ago. She underwent ultrasound in 2021 which was negative for hernia or other acute finding. The mass remains and has increased in size.    3) Abdominal Pain: Acute for the last 3 weeks and located to the LUQ. Her pain is intermittent for which she describes as a twisting pain. Her pain is not worse with eating or drinking, movement. Her pain has improved some over the last two days. She does not take PPI treatment.    4)GAD: Chronic for decades and is currently managed on sertraline 50 mg daily and hydroxyzine 10 mg HS. She continues to experience anxiety with symptoms including irritability, mind racing thoughts, fixating on things, constant worrying. Symptoms have progressed since she quit drinking alcohol. She continues to experience difficulty falling and staying asleep.   5) Hypertension: Currently prescribed olmesartan 40 mg. She has not taken this medication in quite some time. She has been checking her BP at home which is running in the 170's-190's/100's.   BP Readings from Last 3 Encounters:  08/04/22 (!) 176/108  04/07/22 130/86  10/30/21 (!) 188/110      Review of Systems  Respiratory:  Negative for shortness of breath.   Cardiovascular:  Negative for chest pain.  Gastrointestinal:  Negative for nausea and vomiting.  Genitourinary:        Hot flashes  Neurological:  Positive for headaches.  Psychiatric/Behavioral:  Positive for agitation and sleep disturbance. The patient is nervous/anxious.          Past Medical History:  Diagnosis Date   AC separation, right, initial encounter    Acute conjunctivitis of left eye 12/27/2020   Acute right lower quadrant pain 06/10/2018   Alcohol abuse    Anxiety    Arthritis    Asthma    Hand injury, right, initial encounter 09/26/2019   History of fracture of clavicle 06/11/2020   Hypertension    Insomnia    Pain of upper abdomen 06/21/2017   Palpitations    a. 05/2019  Echo: EF 60-65%, diast dysfxn; b. 05/2019 Zio: Sinus rhythm-sinus tach. Avg HR 105. 6 SVT episodes - longest 7 beats. Rare PVCs-->Diltiazem added.   Periumbilical abdominal pain 03/12/2020   Spongiotic dermatitis     Social History   Socioeconomic History   Marital status: Single    Spouse name: Not on file   Number of children: 2   Years of education: Not on file   Highest education level: Not on file  Occupational History   Occupation: Event organiser: [employer]   Tobacco Use   Smoking status: Never   Smokeless tobacco: Never  Vaping Use   Vaping Use: Never used  Substance and Sexual Activity   Alcohol use: Yes    Comment: 2-3 per week   Drug use: No   Sexual activity: Not on file  Other Topics Concern   Not on file  Social History Narrative   Single.   2 children, 1 step-son.   Manager at [employer].   Enjoys going to [employer], spending time with family.    Social Determinants of Health   Financial Resource Strain: Not on file  Food Insecurity: Not on file  Transportation Needs: Not on file  Physical Activity: Not on file  Stress: Not on file  Social Connections: Not on file  Intimate Partner Violence: Not on file    Past Surgical History:  Procedure Laterality Date   CESAREAN SECTION     FOOT FRACTURE SURGERY Right    ORIF CLAVICULAR FRACTURE Right 06/17/2020   Procedure: OPEN REDUCTION INTERNAL FIXATION (ORIF) RIGHT CLAVICLE FRACTURE, ACROMIOCLAVICULAR SEPARATION;  Surgeon: Tarry Kos, MD;  Location: [facility];  Service: Orthopedics;  Laterality: Right;   ORIF CLAVICULAR FRACTURE Right 07/05/2020   Procedure: REVISION OPEN REDUCTION INTERNAL FIXATION (ORIF) RIGHT CLAVICLE FRACTURE, ACROMIOCLAVICULAR SEPARATION;  Surgeon: Tarry Kos, MD;  Location: [facility];  Service: Orthopedics;  Laterality: Right;   PERCUTANEOUS PINNING PHALANX FRACTURE OF HAND Right    ROBOTIC ASSISTED TOTAL HYSTERECTOMY N/A 03/05/2014   Procedure: ROBOTIC ASSISTED TOTAL HYSTERECTOMY/BILATERAL SALPINGECTOMY;  Surgeon: Serita Kyle, MD;  Location: [facility];  Service: Gynecology;  Laterality: N/A;   TUBAL LIGATION      Family History  Problem Relation Age of Onset   Arthritis Mother    Kidney disease Mother    Arthritis Father    Lung cancer Father    Heart attack Father    Lymphoma Father    Diabetes Father    Epilepsy Brother    Breast cancer Maternal Aunt    Hyperlipidemia Maternal Grandmother    Stroke Maternal Grandmother     Hypertension Maternal Grandmother    Heart attack Maternal Grandmother    Diabetes Maternal Grandmother    Alcohol abuse Paternal Grandmother    Arthritis Paternal Grandmother    Mental illness Paternal Grandmother    Lupus Son    Colon cancer Neg Hx    Esophageal cancer Neg Hx    Stomach cancer Neg Hx    Rectal cancer Neg Hx     Allergies  Allergen Reactions   Phenergan [Promethazine Hcl] Hives and Rash   Sulfa Antibiotics Hives and Rash    Pt states sent her to the hospital.   Trazodone And Nefazodone     Hallucinations     Current Outpatient Medications on File Prior to Visit  Medication Sig Dispense Refill   hydrOXYzine (ATARAX) 10 MG tablet Take 1 tablet (10 mg total) by mouth at bedtime as  needed for anxiety. 90 tablet 2   montelukast (SINGULAIR) 10 MG tablet TAKE 1 TABLET (10 MG TOTAL) BY MOUTH AT BEDTIME. FOR ALLERGIES/ASTHMA. 90 tablet 3   Budesonide (PULMICORT FLEXHALER) 90 MCG/ACT inhaler INHALE 1-2 PUFFS INTO THE LUNGS TWICE A DAY (Patient not taking: Reported on 08/04/2022) 1 each 2   No current facility-administered medications on file prior to visit.    BP (!) 176/108   Pulse 90   Temp (!) 97.3 F (36.3 C) (Temporal)   Ht 5' 3.5" (1.613 m)   Wt 105 lb (47.6 kg)   LMP 03/05/2014 Comment: LMP x 3324yrs ago   SpO2 98%   BMI 18.31 kg/m  Objective:   Physical Exam Cardiovascular:     Rate and Rhythm: Normal rate and regular rhythm.  Pulmonary:     Effort: Pulmonary effort is normal.     Breath sounds: Normal breath sounds.  Abdominal:     Comments: Soft tissue mass noted to left of umbilical region. Immobile, non tender.  Musculoskeletal:     Cervical back: Neck supple.  Skin:    General: Skin is warm and dry.  Neurological:     Mental Status: She is alert.  Psychiatric:        Mood and Affect: Mood normal.           Assessment & Plan:   Problem List Items Addressed This Visit       Cardiovascular and Mediastinum   Essential hypertension     Uncontrolled, also off olmesartan for an unknown period of time.  Resume olmesartan at 20 mg daily x 1 week, then increase to 40 mg thereafter.  Follow up in 1 month. She will also monitor at home.      Relevant Medications   olmesartan (BENICAR) 40 MG tablet     Respiratory   Asthma    Controlled.  Continue albuterol PRN. Continue Pulmicort seasonally PRN.      Relevant Medications   albuterol (VENTOLIN HFA) 108 (90 Base) MCG/ACT inhaler     Other   Insomnia    Exacerbated by alcohol withdrawal, also with uncontrolled anxiety.  Start by treating anxiety. Will also be treating alcohol cravings with gabapentin HS.  Continue to monitor.  Follow up in 1 month.      GAD (generalized anxiety disorder) - Primary    Uncontrolled.  Stop sertraline 50 mg. Start venlafaxine ER 37.5 mg daily.  Follow up in 1 month.      Relevant Medications   venlafaxine XR (EFFEXOR XR) 37.5 MG 24 hr capsule   Alcohol abuse    Commended her on quitting and encouraged her to continue with her progress.  I strongly advised she seek ongoing management including joining AA meetings, therapy, etc. She kindly declines therapy and substance dependence provider at this time.   Checking labs today.  We discussed that I am not well versed in treating alcohol cravings, but we will try gabapentin 100-300 mg HS to start. Close follow up in 1 month.      Relevant Medications   gabapentin (NEURONTIN) 100 MG capsule   Other Relevant Orders   CBC   Lipase   T4, free   TSH   Comprehensive metabolic panel   VITAMIN D 25 Hydroxy (Vit-D Deficiency, Fractures)   Vitamin B12   Vitamin B1   Night sweats    Could be a combination of menopause vs chronic alcohol abuse with recent discontinuation.  Treat with venlafaxine ER 37.5 mg daily.  Follow up in month.      Left upper quadrant abdominal pain    Differentials include H pylori, gastritis, MSK cause.  Labs pending today including lipase,  h pylori testing, CBC, CMP. Await results.       Relevant Orders   H. pylori breath test   Mass of soft tissue of abdomen    Representative of cyst. Discussed with patient. Reviewed abdominal US from 2021.  Ordered soft tissue abdomen ultrasound which is pending.      Relevant Orders   [place] CHEST SOFT TISSUE      Doreene Nest, NP

## 2022-08-04 NOTE — Patient Instructions (Signed)
Start gabapentin 100 mg for alcohol cravings and sleep. Take 1 to 3 capsules by mouth at bedtime.  Stop sertraline (Zoloft) for anxiety. Start venlafaxine ER 37.5 mg daily for anxiety and hot flashes.  Stop by the lab prior to leaving today. I will notify you of your results once received.   You will either be contacted via phone regarding your ultrasound, or you may receive a letter on your MyChart portal from our referral team with instructions for scheduling an appointment. Please let us know if you have not been contacted by anyone within two weeks.  Resume olmesartan. Start with 1/2 tablet daily for 1 week, then increase to 1 full tablet thereafter.  Schedule a follow up visit for one month.  It was a pleasure to see you today!

## 2022-08-04 NOTE — Assessment & Plan Note (Signed)
Commended her on quitting and encouraged her to continue with her progress.  I strongly advised she seek ongoing management including joining AA meetings, therapy, etc. She kindly declines therapy and substance dependence provider at this time.   Checking labs today.  We discussed that I am not well versed in treating alcohol cravings, but we will try gabapentin 100-300 mg HS to start. Close follow up in 1 month.

## 2022-08-04 NOTE — Assessment & Plan Note (Signed)
Exacerbated by alcohol withdrawal, also with uncontrolled anxiety.  Start by treating anxiety. Will also be treating alcohol cravings with gabapentin HS.  Continue to monitor.  Follow up in 1 month.

## 2022-08-04 NOTE — Assessment & Plan Note (Signed)
Controlled.  Continue albuterol PRN. Continue Pulmicort seasonally PRN.

## 2022-08-04 NOTE — Assessment & Plan Note (Signed)
Uncontrolled.  Stop sertraline 50 mg. Start venlafaxine ER 37.5 mg daily.  Follow up in 1 month.

## 2022-08-04 NOTE — Assessment & Plan Note (Signed)
Uncontrolled, also off olmesartan for an unknown period of time.  Resume olmesartan at 20 mg daily x 1 week, then increase to 40 mg thereafter.  Follow up in 1 month. She will also monitor at home.

## 2022-08-04 NOTE — Assessment & Plan Note (Signed)
Differentials include H pylori, gastritis, MSK cause.  Labs pending today including lipase, h pylori testing, CBC, CMP. Await results.

## 2022-08-05 ENCOUNTER — Other Ambulatory Visit: Payer: Self-pay | Admitting: Primary Care

## 2022-08-05 DIAGNOSIS — E559 Vitamin D deficiency, unspecified: Secondary | ICD-10-CM

## 2022-08-05 LAB — CBC
HCT: 33.3 % — ABNORMAL LOW (ref 36.0–46.0)
Hemoglobin: 11.3 g/dL — ABNORMAL LOW (ref 12.0–15.0)
MCHC: 33.9 g/dL (ref 30.0–36.0)
MCV: 94.9 fl (ref 78.0–100.0)
Platelets: 234 10*3/uL (ref 150.0–400.0)
RBC: 3.51 Mil/uL — ABNORMAL LOW (ref 3.87–5.11)
RDW: 14.7 % (ref 11.5–15.5)
WBC: 4.2 10*3/uL (ref 4.0–10.5)

## 2022-08-05 LAB — COMPREHENSIVE METABOLIC PANEL
ALT: 21 U/L (ref 0–35)
AST: 20 U/L (ref 0–37)
Albumin: 3.9 g/dL (ref 3.5–5.2)
Alkaline Phosphatase: 74 U/L (ref 39–117)
BUN: 5 mg/dL — ABNORMAL LOW (ref 6–23)
CO2: 32 mEq/L (ref 19–32)
Calcium: 8.9 mg/dL (ref 8.4–10.5)
Chloride: 104 mEq/L (ref 96–112)
Creatinine, Ser: 0.92 mg/dL (ref 0.40–1.20)
GFR: 72.65 mL/min (ref >60.00)
Glucose, Bld: 100 mg/dL — ABNORMAL HIGH (ref 70–99)
Potassium: 4.5 mEq/L (ref 3.5–5.1)
Sodium: 144 mEq/L (ref 135–145)
Total Bilirubin: 0.3 mg/dL (ref 0.2–1.2)
Total Protein: 6.3 g/dL (ref 6.0–8.3)

## 2022-08-05 LAB — VITAMIN B12: Vitamin B-12: 360 pg/mL (ref 211–911)

## 2022-08-05 LAB — T4, FREE: Free T4: 0.74 ng/dL (ref 0.60–1.60)

## 2022-08-05 LAB — LIPASE: Lipase: 71 U/L — ABNORMAL HIGH (ref 11.0–59.0)

## 2022-08-05 LAB — VITAMIN D 25 HYDROXY (VIT D DEFICIENCY, FRACTURES): VITD: 14.89 ng/mL — ABNORMAL LOW (ref 30.00–100.00)

## 2022-08-05 LAB — TSH: TSH: 0.38 u[IU]/mL (ref 0.35–5.50)

## 2022-08-05 MED ORDER — VITAMIN D (ERGOCALCIFEROL) 1.25 MG (50000 UNIT) PO CAPS: ORAL_CAPSULE | ORAL | 0 refills | Status: DC

## 2022-08-06 LAB — H. PYLORI BREATH TEST: H. pylori Breath Test: NOT DETECTED

## 2022-08-08 LAB — VITAMIN B1: Vitamin B1 (Thiamine): 11 nmol/L (ref 8–30)

## 2022-08-17 ENCOUNTER — Ambulatory Visit
Admission: RE | Admit: 2022-08-17 | Discharge: 2022-08-17 | Disposition: A | Discharge status: Home / Self Care | Payer: No Typology Code available for payment source | Source: Ambulatory Visit | Attending: Primary Care | Admitting: Primary Care

## 2022-08-17 DIAGNOSIS — R1900 Intra-abdominal and pelvic swelling, mass and lump, unspecified site: Secondary | ICD-10-CM

## 2022-08-17 DIAGNOSIS — K429 Umbilical hernia without obstruction or gangrene: Secondary | ICD-10-CM

## 2022-08-31 ENCOUNTER — Other Ambulatory Visit: Payer: Self-pay | Admitting: Primary Care

## 2022-08-31 DIAGNOSIS — J4520 Mild intermittent asthma, uncomplicated: Secondary | ICD-10-CM

## 2022-09-03 ENCOUNTER — Ambulatory Visit
Admission: RE | Admit: 2022-09-03 | Discharge: 2022-09-03 | Disposition: A | Discharge status: Home / Self Care | Payer: No Typology Code available for payment source | Source: Ambulatory Visit | Attending: Primary Care | Admitting: Primary Care

## 2022-09-03 DIAGNOSIS — Z1231 Encounter for screening mammogram for malignant neoplasm of breast: Secondary | ICD-10-CM

## 2022-10-08 ENCOUNTER — Other Ambulatory Visit: Payer: Self-pay | Admitting: Primary Care

## 2022-10-08 DIAGNOSIS — F1010 Alcohol abuse, uncomplicated: Secondary | ICD-10-CM

## 2022-10-08 NOTE — Telephone Encounter (Signed)
Called and spoke with patient she is still having a hard time falling asleep but the medication has seemed to help with the alcohol cravings. She states she is doing really well otherwise. She would like to continue it as she feels it is helping.

## 2022-10-08 NOTE — Telephone Encounter (Signed)
We can increase the dose of her gabapentin to facilitate sleep. How many capsules is she taking now?  If just taking 1 capsule nightly then I recommend increasing to 2 capsules or even 3 capsules.  Let me know.

## 2022-10-08 NOTE — Telephone Encounter (Signed)
Please call patient:  Received refill request for gabapentin for which I prescribed to help with sleep and alcohol cravings.  How is she doing?  Does she want to continue the medication?

## 2022-10-09 NOTE — Telephone Encounter (Signed)
Unable to reach patient. Left voicemail to return call to our office.   

## 2022-10-12 NOTE — Telephone Encounter (Signed)
Unable to reach patient. Left voicemail to return call to our office.   

## 2022-10-14 MED ORDER — GABAPENTIN 300 MG PO CAPS: 300.0000 mg | ORAL_CAPSULE | Freq: Every day | ORAL | 0 refills | Status: DC

## 2022-10-20 ENCOUNTER — Other Ambulatory Visit: Payer: Self-pay | Admitting: Primary Care

## 2022-10-20 DIAGNOSIS — E559 Vitamin D deficiency, unspecified: Secondary | ICD-10-CM

## 2022-10-20 DIAGNOSIS — F1010 Alcohol abuse, uncomplicated: Secondary | ICD-10-CM

## 2022-10-20 NOTE — Telephone Encounter (Signed)
Please call patient:  Received refill request for vitamin D capsules. Needs repeat vitamin d lab. I also want to recheck pancreas levels.   Lab only appt is fine. Non fasting is fine.

## 2022-10-20 NOTE — Telephone Encounter (Signed)
Unable to reach patient. Left voicemail to return call to our office.   

## 2022-10-22 NOTE — Telephone Encounter (Signed)
Unable to reach patient. Left voicemail to return call to our office.   

## 2022-10-23 NOTE — Telephone Encounter (Signed)
FYI- I have not been able to reach her regarding the Rx request for vitmain d and your response.

## 2022-10-23 NOTE — Telephone Encounter (Signed)
Patient has been notified through [employer] and will call to set up lab appt.

## 2022-10-30 ENCOUNTER — Other Ambulatory Visit: Payer: Self-pay | Admitting: Primary Care

## 2022-10-30 DIAGNOSIS — I10 Essential (primary) hypertension: Secondary | ICD-10-CM

## 2022-10-30 DIAGNOSIS — F411 Generalized anxiety disorder: Secondary | ICD-10-CM

## 2022-10-30 NOTE — Telephone Encounter (Signed)
Patient notified as instructed by telephone and verbalized understanding. Patient scheduled an appointment with Allie Bossier NP 11/06/22 at 12:20. Patient stated that she has not requested any refills at this time and the request must have been an automatic refill. Patient that she has enough medication to last until her upcoming appointment.

## 2022-10-30 NOTE — Telephone Encounter (Signed)
Noted  

## 2022-10-30 NOTE — Telephone Encounter (Signed)
We really need to see patient for BP follow up. She was instructed in late November 2023 to set up a 1 month visit and has yet to do so.  If you can't get a hold of patient via phone the send MyChart message.

## 2022-11-06 ENCOUNTER — Encounter: Payer: Self-pay | Admitting: *Deleted

## 2022-11-06 ENCOUNTER — Encounter: Payer: Self-pay | Admitting: Primary Care

## 2022-11-06 ENCOUNTER — Ambulatory Visit (INDEPENDENT_AMBULATORY_CARE_PROVIDER_SITE_OTHER): Payer: No Typology Code available for payment source | Admitting: Primary Care

## 2022-11-06 VITALS — BP 148/100 | HR 88 | Temp 98.1°F | Ht 63.5 in | Wt 104.0 lb

## 2022-11-06 DIAGNOSIS — F411 Generalized anxiety disorder: Secondary | ICD-10-CM | POA: Diagnosis not present

## 2022-11-06 DIAGNOSIS — J4520 Mild intermittent asthma, uncomplicated: Secondary | ICD-10-CM

## 2022-11-06 DIAGNOSIS — J302 Other seasonal allergic rhinitis: Secondary | ICD-10-CM | POA: Diagnosis not present

## 2022-11-06 DIAGNOSIS — F1010 Alcohol abuse, uncomplicated: Secondary | ICD-10-CM

## 2022-11-06 DIAGNOSIS — G4700 Insomnia, unspecified: Secondary | ICD-10-CM

## 2022-11-06 DIAGNOSIS — Z23 Encounter for immunization: Secondary | ICD-10-CM

## 2022-11-06 DIAGNOSIS — E559 Vitamin D deficiency, unspecified: Secondary | ICD-10-CM | POA: Diagnosis not present

## 2022-11-06 DIAGNOSIS — I10 Essential (primary) hypertension: Secondary | ICD-10-CM

## 2022-11-06 DIAGNOSIS — R748 Abnormal levels of other serum enzymes: Secondary | ICD-10-CM | POA: Diagnosis not present

## 2022-11-06 LAB — VITAMIN D 25 HYDROXY (VIT D DEFICIENCY, FRACTURES): VITD: 32.81 ng/mL (ref 30.00–100.00)

## 2022-11-06 LAB — LIPASE: Lipase: 42 U/L (ref 11.0–59.0)

## 2022-11-06 MED ORDER — FLUTICASONE PROPIONATE 50 MCG/ACT NA SUSP: 1.0000 | Freq: Two times a day (BID) | NASAL | 0 refills | Status: DC

## 2022-11-06 MED ORDER — AMLODIPINE BESYLATE 5 MG PO TABS: 5.0000 mg | ORAL_TABLET | Freq: Every day | ORAL | 0 refills | Status: DC

## 2022-11-06 NOTE — Patient Instructions (Signed)
Continue olmesartan 40 mg daily for blood pressure.  Start amlodipine 5 mg once daily for blood pressure.  Stop by the lab prior to leaving today. I will notify you of your results once received.   Please schedule a follow up visit to meet back with me in 2-3 weeks for blood pressure check.   It was a pleasure to see you today!

## 2022-11-06 NOTE — Assessment & Plan Note (Signed)
Uncontrolled, but improved compared to last visit.  Continue olmesartan 40 mg daily. Add amlodipine 5 mg daily.    Follow-up in 2 to 3 weeks for blood pressure check.

## 2022-11-06 NOTE — Assessment & Plan Note (Signed)
Controlled.  Continue Singulair 10 mg at bedtime, Pulmicort 90 mcg inhaler twice daily seasonally, albuterol inhaler as needed.

## 2022-11-06 NOTE — Assessment & Plan Note (Signed)
Recent relapse.  Referral placed to psychiatry for further management, along with anxiety and insomnia.  Continue gabapentin 300 mg at bedtime for cravings.

## 2022-11-06 NOTE — Progress Notes (Signed)
Subjective:    Patient ID: Kelly Walton, female    DOB: [birth year], 52 y.o.   MRN: [id number]  HPI  Kelly Walton is a very pleasant 52 y.o. female with a history of hypertension, asthma, GERD, insomnia, GAD, alcohol dependence who presents today for follow-up.  1) Hypertension: Currently managed on olmesartan 40 mg daily which was resumed during her last visit in late November 2023. At the time it was recommended she return 1 month later for follow-up of blood pressure, she returns today.  Since her last visit she is compliant to her olmesartan 40 mg daily. She is checking her BP at home which is running "high".   BP Readings from Last 3 Encounters:  11/06/22 (!) 148/100  08/04/22 (!) 176/108  04/07/22 130/86     2) Alcohol Dependence: Currently managed on gabapentin 300 mg at bedtime for alcohol cravings.  She was last evaluated in November 2023, at the time quit alcohol consumption (cold [place]) on 07/24/2022.  Gabapentin was initiated to help reduce cravings.  Also, participation in alcoholic Anonymous was recommended.  Since her last visit she did relapse for one day a few weeks ago, drank quite a bit that day. She's had one wine cooler since then.   She continues with alcohol cravings. She's not sure if gabapentin has helped with cravings. She is not seeing a therapist.   3) Asthma: Currently managed on budesonide 90 mcg, 1 to 2 puffs twice daily, albuterol inhaler as needed, Singulair 10 mg at bedtime.  Overall her asthma has improved but she continues with allergies including rhinorrhea. She has been taking Claritin and Singulair 20 mg for the last 4 days. She has not used nasal sprays.   4) GAD/Insomnia: Currently managed on venlafaxine ER 37.5 mg daily and hydroxyzine 10 mg as needed.  Overall she feels that her anxiety has improved, some days are better than others. She continues to experience insomnia, lays awake with mind racing thoughts.   Review of Systems  HENT:   Positive for rhinorrhea.   Respiratory:  Negative for shortness of breath and wheezing.   Cardiovascular:  Negative for chest pain.  Allergic/Immunologic: Positive for environmental allergies.  Neurological:  Negative for headaches.  Psychiatric/Behavioral:  Positive for sleep disturbance. The patient is nervous/anxious.          Past Medical History:  Diagnosis Date   AC separation, right, initial encounter    Acute conjunctivitis of left eye 12/27/2020   Acute right lower quadrant pain 06/10/2018   Alcohol abuse    Anxiety    Arthritis    Asthma    Hand injury, right, initial encounter 09/26/2019   History of fracture of clavicle 06/11/2020   Hypertension    Insomnia    Pain of upper abdomen 06/21/2017   Palpitations    a. 05/2019 Echo: EF 60-65%, diast dysfxn; b. 05/2019 Zio: Sinus rhythm-sinus tach. Avg HR 105. 6 SVT episodes - longest 7 beats. Rare PVCs-->Diltiazem added.   Periumbilical abdominal pain 03/12/2020   Spongiotic dermatitis     Social History   Socioeconomic History   Marital status: Single    Spouse name: Not on file   Number of children: 2   Years of education: Not on file   Highest education level: Not on file  Occupational History   Occupation: Best boy: [employer]  Tobacco Use   Smoking status: Never   Smokeless tobacco: Never  Vaping Use   Vaping Use: Never  used  Substance and Sexual Activity   Alcohol use: Yes    Comment: 2-3 per week   Drug use: No   Sexual activity: Not on file  Other Topics Concern   Not on file  Social History Narrative   Single.   2 children, 1 step-son.   Manager at [employer].   Enjoys going to [employer], spending time with family.    Social Determinants of Health   Financial Resource Strain: Not on file  Food Insecurity: Not on file  Transportation Needs: Not on file  Physical Activity: Not on file  Stress: Not on file  Social Connections: Not on file  Intimate Partner Violence: Not on file    Past  Surgical History:  Procedure Laterality Date   CESAREAN SECTION     FOOT FRACTURE SURGERY Right    ORIF CLAVICULAR FRACTURE Right 06/17/2020   Procedure: OPEN REDUCTION INTERNAL FIXATION (ORIF) RIGHT CLAVICLE FRACTURE, ACROMIOCLAVICULAR SEPARATION;  Surgeon: Leandrew Koyanagi, MD;  Location: [facility];  Service: Orthopedics;  Laterality: Right;   ORIF CLAVICULAR FRACTURE Right 07/05/2020   Procedure: REVISION OPEN REDUCTION INTERNAL FIXATION (ORIF) RIGHT CLAVICLE FRACTURE, ACROMIOCLAVICULAR SEPARATION;  Surgeon: Leandrew Koyanagi, MD;  Location: [facility];  Service: Orthopedics;  Laterality: Right;   PERCUTANEOUS PINNING PHALANX FRACTURE OF HAND Right    ROBOTIC ASSISTED TOTAL HYSTERECTOMY N/A 03/05/2014   Procedure: ROBOTIC ASSISTED TOTAL HYSTERECTOMY/BILATERAL SALPINGECTOMY;  Surgeon: Marvene Staff, MD;  Location: [facility];  Service: Gynecology;  Laterality: N/A;   TUBAL LIGATION      Family History  Problem Relation Age of Onset   Arthritis Mother    Kidney disease Mother    Arthritis Father    Lung cancer Father    Heart attack Father    Lymphoma Father    Diabetes Father    Epilepsy Brother    Breast cancer Maternal Aunt    Hyperlipidemia Maternal Grandmother    Stroke Maternal Grandmother    Hypertension Maternal Grandmother    Heart attack Maternal Grandmother    Diabetes Maternal Grandmother    Alcohol abuse Paternal Grandmother    Arthritis Paternal Grandmother    Mental illness Paternal Grandmother    Lupus Son    Colon cancer Neg Hx    Esophageal cancer Neg Hx    Stomach cancer Neg Hx    Rectal cancer Neg Hx     Allergies  Allergen Reactions   Phenergan [Promethazine Hcl] Hives and Rash   Sulfa Antibiotics Hives and Rash    Pt states sent her to the hospital.   Trazodone And Nefazodone     Hallucinations     Current Outpatient Medications on File Prior to Visit  Medication Sig Dispense Refill   albuterol (VENTOLIN HFA) 108 (90 Base) MCG/ACT  inhaler TAKE 2 PUFFS BY MOUTH EVERY 6 HOURS AS NEEDED FOR WHEEZE OR SHORTNESS OF BREATH 6.7 each 0   gabapentin (NEURONTIN) 300 MG capsule Take 1 capsule (300 mg total) by mouth at bedtime. 90 capsule 0   hydrOXYzine (ATARAX) 10 MG tablet Take 1 tablet (10 mg total) by mouth at bedtime as needed for anxiety. 90 tablet 2   montelukast (SINGULAIR) 10 MG tablet TAKE 1 TABLET (10 MG TOTAL) BY MOUTH AT BEDTIME. FOR ALLERGIES/ASTHMA. 90 tablet 3   olmesartan (BENICAR) 40 MG tablet TAKE 1 TABLET (40 MG TOTAL) BY MOUTH DAILY FOR BLOOD PRESSURE 90 tablet 0   venlafaxine XR (EFFEXOR-XR) 37.5 MG 24 hr capsule TAKE 1 CAPSULE (  37.5 MG TOTAL) BY MOUTH DAILY WITH BREAKFAST. FOR ANXIETY AND HOT FLASHES 90 capsule 0   Vitamin D, Ergocalciferol, (DRISDOL) 1.25 MG (50000 UNIT) CAPS capsule Take 1 capsule by mouth once weekly for 12 weeks. 12 capsule 0   Budesonide (PULMICORT FLEXHALER) 90 MCG/ACT inhaler INHALE 1-2 PUFFS INTO THE LUNGS TWICE A DAY (Patient not taking: Reported on 08/04/2022) 1 each 2   No current facility-administered medications on file prior to visit.    BP (!) 148/100   Pulse 88   Temp 98.1 F (36.7 C) (Temporal)   Ht 5' 3.5" (1.613 m)   Wt 104 lb (47.2 kg)   LMP 03/05/2014 Comment: LMP x 35yr ago   SpO2 99%   BMI 18.13 kg/m  Objective:   Physical Exam Cardiovascular:     Rate and Rhythm: Normal rate and regular rhythm.  Pulmonary:     Effort: Pulmonary effort is normal.     Breath sounds: Normal breath sounds.  Musculoskeletal:     Cervical back: Neck supple.  Skin:    General: Skin is warm and dry.           Assessment & Plan:  GAD (generalized anxiety disorder) Assessment & Plan: Slightly improved, continues.  Continue venlafaxine ER 37.5 mg daily. Referral placed to psychiatry for further management.  Orders: -     Ambulatory referral to Psychiatry  Alcohol abuse Assessment & Plan: Recent relapse.  Referral placed to psychiatry for further management, along  with anxiety and insomnia.  Continue gabapentin 300 mg at bedtime for cravings.  Orders: -     Ambulatory referral to Psychiatry  Insomnia, unspecified type Assessment & Plan: Chronic and continued.  Continue venlafaxine ER 37.5 mg daily. Referral placed to psychiatry for further evaluation.  Orders: -     Ambulatory referral to Psychiatry  Seasonal allergies -     Fluticasone Propionate; Place 1 spray into both nostrils 2 (two) times daily.  Dispense: 16 g; Refill: 0  Vitamin D deficiency Assessment & Plan: No longer on treatment or OTC supplements. Repeat vitamin D level pending.  Orders: -     VITAMIN D 25 Hydroxy (Vit-D Deficiency, Fractures)  Elevated lipase Assessment & Plan: Repeat is pending. Asymptomatic.  Orders: -     Lipase  Essential hypertension Assessment & Plan: Uncontrolled, but improved compared to last visit.  Continue olmesartan 40 mg daily. Add amlodipine 5 mg daily.    Follow-up in 2 to 3 weeks for blood pressure check.  Orders: -     amLODIPine Besylate; Take 1 tablet (5 mg total) by mouth daily. for blood pressure.  Dispense: 90 tablet; Refill: 0  Mild intermittent asthma without complication Assessment & Plan: Controlled.  Continue Singulair 10 mg at bedtime, Pulmicort 90 mcg inhaler twice daily seasonally, albuterol inhaler as needed.   Need for immunization against influenza -     Flu Vaccine QUAD 679moM (Fluarix, Fluzone & Alfiuria Quad PF)        KaPleas KochNP

## 2022-11-06 NOTE — Assessment & Plan Note (Signed)
Chronic and continued.  Continue venlafaxine ER 37.5 mg daily. Referral placed to psychiatry for further evaluation.

## 2022-11-06 NOTE — Assessment & Plan Note (Signed)
Repeat is pending. Asymptomatic.

## 2022-11-06 NOTE — Assessment & Plan Note (Signed)
No longer on treatment or OTC supplements. Repeat vitamin D level pending.

## 2022-11-06 NOTE — Assessment & Plan Note (Signed)
Slightly improved, continues.  Continue venlafaxine ER 37.5 mg daily. Referral placed to psychiatry for further management.

## 2022-12-01 ENCOUNTER — Other Ambulatory Visit: Payer: Self-pay | Admitting: Primary Care

## 2022-12-01 DIAGNOSIS — J302 Other seasonal allergic rhinitis: Secondary | ICD-10-CM

## 2023-01-01 ENCOUNTER — Other Ambulatory Visit: Payer: Self-pay | Admitting: Primary Care

## 2023-01-01 DIAGNOSIS — F1010 Alcohol abuse, uncomplicated: Secondary | ICD-10-CM

## 2023-01-02 NOTE — Telephone Encounter (Signed)
Received refill request for gabapentin for which is prescribed for alcohol cravings and sleep.  Is this helping with either? If not then I recommend we discontinue.

## 2023-01-04 NOTE — Telephone Encounter (Signed)
Unable to reach patient. Left voicemail to return call to our office.   

## 2023-01-05 NOTE — Telephone Encounter (Signed)
Noted, refill sent to pharmacy. 

## 2023-01-05 NOTE — Telephone Encounter (Signed)
Called and spoke with patient she states that the gabapentin is helping tremendously with both the alcohol cravings and sleep. Would like a refill.

## 2023-01-16 ENCOUNTER — Other Ambulatory Visit: Payer: Self-pay | Admitting: Primary Care

## 2023-01-16 DIAGNOSIS — F411 Generalized anxiety disorder: Secondary | ICD-10-CM

## 2023-02-04 ENCOUNTER — Other Ambulatory Visit: Payer: Self-pay | Admitting: Primary Care

## 2023-02-04 DIAGNOSIS — I10 Essential (primary) hypertension: Secondary | ICD-10-CM

## 2023-02-24 NOTE — Progress Notes (Signed)
Office Visit Note   Patient: Kelly Walton           Date of Birth: 11-30-1970           MRN: [id number] Visit Date: 02/25/2023              Requested by: Doreene Nest, NP [address] [place],  [place] [address] PCP: Doreene Nest, NP   Assessment & Plan: Visit Diagnoses:  1. Pain of right clavicle   2. Primary osteoarthritis of right knee     Plan: Impression is 52 year old female with right shoulder and right knee pain.  In terms of the shoulder impression is symptomatic hardware and posttraumatic AC arthritis.  We will order CT scan to assess the position of the hardware and the degree of posttraumatic arthritis.  For the right knee sounds fairly classic for early osteoarthritis.  I will send in prescription for diclofenac and she will use a knee brace and continue to use Voltaren gel.  She will follow-up with me after the CT scan of the shoulder.  Follow-Up Instructions: No follow-ups on file.   Orders:  Orders Placed This Encounter  Procedures   XR Clavicle Right   XR KNEE 3 VIEW RIGHT   CT SHOULDER RIGHT WO CONTRAST   Meds ordered this encounter  Medications   diclofenac (VOLTAREN) 75 MG EC tablet    Sig: Take 1 tablet (75 mg total) by mouth 2 (two) times daily.    Dispense:  30 tablet    Refill:  2      Procedures: No procedures performed   Clinical Data: No additional findings.   Subjective: Chief Complaint  Patient presents with   Right Shoulder - Pain   Right Knee - Pain    HPI Kelly Walton is a 52 year old female who is well-known to me for clavicle fracture that I fixed a few years ago.  She has been having pain and popping around the distal clavicle for about 3 months.  Denies any injuries or changes in activity.  In regards to the knee has been hurting for about 6 to 8 months and it feels like it is worse with standing and walking.  She has been working a lot recently.  Denies any injuries.  Using Voltaren gel currently. Review of Systems   Constitutional: Negative.   HENT: Negative.    Eyes: Negative.   Respiratory: Negative.    Cardiovascular: Negative.   Endocrine: Negative.   Musculoskeletal: Negative.   Neurological: Negative.   Hematological: Negative.   Psychiatric/Behavioral: Negative.    All other systems reviewed and are negative.    Objective: Vital Signs: LMP 03/05/2014 Comment: LMP x 34yrs ago   Physical Exam Vitals and nursing note reviewed.  Constitutional:      Appearance: She is well-developed.  HENT:     Head: Atraumatic.     Nose: Nose normal.  Eyes:     Extraocular Movements: Extraocular movements intact.  Cardiovascular:     Pulses: Normal pulses.  Pulmonary:     Effort: Pulmonary effort is normal.  Abdominal:     Palpations: Abdomen is soft.  Musculoskeletal:     Cervical back: Neck supple.  Skin:    General: Skin is warm.     Capillary Refill: Capillary refill takes less than 2 seconds.  Neurological:     Mental Status: She is alert. Mental status is at baseline.  Psychiatric:        Behavior: Behavior normal.  Thought Content: Thought content normal.        Judgment: Judgment normal.    Ortho Exam Examination of the right shoulder shows fully healed surgical scar.  Distal clavicle is slightly prominent.  There is 1+ soft tissue crepitus with shoulder range of motion.  Shoulder range of motion is well-preserved.  Strength is preserved.  Mildly positive impingement signs.  Examination right knee is nonfocal.  No joint effusion.  Normal range of motion. Specialty Comments:  No specialty comments available.  Imaging: No results found.   PMFS History: Patient Active Problem List   Diagnosis Date Noted   Vitamin D deficiency 11/06/2022   Elevated lipase 11/06/2022   Left upper quadrant abdominal pain 08/04/2022   Mass of soft tissue of abdomen 08/04/2022   GERD (gastroesophageal reflux disease) 04/07/2022   Frequent headaches 10/02/2021   Night sweats 10/02/2021    Dizziness 04/18/2021   Tachycardia 04/18/2021   Dyspareunia, female 04/24/2020   Toe pain, chronic, left 02/28/2020   GAD (generalized anxiety disorder) 01/02/2020   Alcohol abuse 01/02/2020   Palpitations 04/03/2019   Seasonal allergies 01/16/2019   Elevated LFTs 11/15/2018   Spongiotic dermatitis 07/08/2018   Hyperlipidemia 07/08/2018   Asthma 08/20/2017   Preventative health care 06/21/2017   Insomnia 04/16/2017   Essential hypertension 04/16/2017   Past Medical History:  Diagnosis Date   AC separation, right, initial encounter    Acute conjunctivitis of left eye 12/27/2020   Acute right lower quadrant pain 06/10/2018   Alcohol abuse    Anxiety    Arthritis    Asthma    Hand injury, right, initial encounter 09/26/2019   History of fracture of clavicle 06/11/2020   Hypertension    Insomnia    Pain of upper abdomen 06/21/2017   Palpitations    a. 05/2019 Echo: EF 60-65%, diast dysfxn; b. 05/2019 Zio: Sinus rhythm-sinus tach. Avg HR 105. 6 SVT episodes - longest 7 beats. Rare PVCs-->Diltiazem added.   Periumbilical abdominal pain 03/12/2020   Spongiotic dermatitis     Family History  Problem Relation Age of Onset   Arthritis Mother    Kidney disease Mother    Arthritis Father    Lung cancer Father    Heart attack Father    Lymphoma Father    Diabetes Father    Epilepsy Brother    Breast cancer Maternal Aunt    Hyperlipidemia Maternal Grandmother    Stroke Maternal Grandmother    Hypertension Maternal Grandmother    Heart attack Maternal Grandmother    Diabetes Maternal Grandmother    Alcohol abuse Paternal Grandmother    Arthritis Paternal Grandmother    Mental illness Paternal Grandmother    Lupus Son    Colon cancer Neg Hx    Esophageal cancer Neg Hx    Stomach cancer Neg Hx    Rectal cancer Neg Hx     Past Surgical History:  Procedure Laterality Date   CESAREAN SECTION     FOOT FRACTURE SURGERY Right    ORIF CLAVICULAR FRACTURE Right 06/17/2020    Procedure: OPEN REDUCTION INTERNAL FIXATION (ORIF) RIGHT CLAVICLE FRACTURE, ACROMIOCLAVICULAR SEPARATION;  Surgeon: Tarry Kos, MD;  Location: [facility];  Service: Orthopedics;  Laterality: Right;   ORIF CLAVICULAR FRACTURE Right 07/05/2020   Procedure: REVISION OPEN REDUCTION INTERNAL FIXATION (ORIF) RIGHT CLAVICLE FRACTURE, ACROMIOCLAVICULAR SEPARATION;  Surgeon: Tarry Kos, MD;  Location: [facility];  Service: Orthopedics;  Laterality: Right;   PERCUTANEOUS PINNING PHALANX FRACTURE OF HAND Right  ROBOTIC ASSISTED TOTAL HYSTERECTOMY N/A 03/05/2014   Procedure: ROBOTIC ASSISTED TOTAL HYSTERECTOMY/BILATERAL SALPINGECTOMY;  Surgeon: Serita Kyle, MD;  Location: [facility];  Service: Gynecology;  Laterality: N/A;   TUBAL LIGATION     Social History   Occupational History   Occupation: Event organiser: [employer]  Tobacco Use   Smoking status: Never   Smokeless tobacco: Never  Vaping Use   Vaping Use: Never used  Substance and Sexual Activity   Alcohol use: Yes    Comment: 2-3 per week   Drug use: No   Sexual activity: Not on file

## 2023-02-25 ENCOUNTER — Ambulatory Visit (INDEPENDENT_AMBULATORY_CARE_PROVIDER_SITE_OTHER): Payer: No Typology Code available for payment source

## 2023-02-25 ENCOUNTER — Ambulatory Visit (INDEPENDENT_AMBULATORY_CARE_PROVIDER_SITE_OTHER): Payer: No Typology Code available for payment source | Admitting: Orthopaedic Surgery

## 2023-02-25 ENCOUNTER — Encounter: Payer: Self-pay | Admitting: Primary Care

## 2023-02-25 ENCOUNTER — Ambulatory Visit: Payer: No Typology Code available for payment source | Admitting: Primary Care

## 2023-02-25 VITALS — BP 116/78 | HR 110 | Temp 98.1°F | Ht 63.5 in | Wt 92.0 lb

## 2023-02-25 DIAGNOSIS — I10 Essential (primary) hypertension: Secondary | ICD-10-CM

## 2023-02-25 DIAGNOSIS — M898X1 Other specified disorders of bone, shoulder: Secondary | ICD-10-CM

## 2023-02-25 DIAGNOSIS — R197 Diarrhea, unspecified: Secondary | ICD-10-CM

## 2023-02-25 DIAGNOSIS — F411 Generalized anxiety disorder: Secondary | ICD-10-CM

## 2023-02-25 DIAGNOSIS — R636 Underweight: Secondary | ICD-10-CM

## 2023-02-25 DIAGNOSIS — R634 Abnormal weight loss: Secondary | ICD-10-CM

## 2023-02-25 DIAGNOSIS — M1711 Unilateral primary osteoarthritis, right knee: Secondary | ICD-10-CM

## 2023-02-25 DIAGNOSIS — G4700 Insomnia, unspecified: Secondary | ICD-10-CM

## 2023-02-25 DIAGNOSIS — F1010 Alcohol abuse, uncomplicated: Secondary | ICD-10-CM

## 2023-02-25 LAB — LIPASE: Lipase: 23 U/L (ref 11.0–59.0)

## 2023-02-25 LAB — IBC + FERRITIN
Ferritin: 133.8 ng/mL (ref 10.0–291.0)
Iron: 44 ug/dL (ref 42–145)
Saturation Ratios: 12 % — ABNORMAL LOW (ref 20.0–50.0)
TIBC: 365.4 ug/dL (ref 250.0–450.0)
Transferrin: 261 mg/dL (ref 212.0–360.0)

## 2023-02-25 LAB — COMPREHENSIVE METABOLIC PANEL
ALT: 28 U/L (ref 0–35)
AST: 48 U/L — ABNORMAL HIGH (ref 0–37)
Albumin: 4.3 g/dL (ref 3.5–5.2)
Alkaline Phosphatase: 78 U/L (ref 39–117)
BUN: 11 mg/dL (ref 6–23)
CO2: 28 mEq/L (ref 19–32)
Calcium: 9.2 mg/dL (ref 8.4–10.5)
Chloride: 102 mEq/L (ref 96–112)
Creatinine, Ser: 1.21 mg/dL — ABNORMAL HIGH (ref 0.40–1.20)
GFR: 52.09 mL/min — ABNORMAL LOW (ref >60.00)
Glucose, Bld: 70 mg/dL (ref 70–99)
Potassium: 3.9 mEq/L (ref 3.5–5.1)
Sodium: 144 mEq/L (ref 135–145)
Total Bilirubin: 0.3 mg/dL (ref 0.2–1.2)
Total Protein: 7.1 g/dL (ref 6.0–8.3)

## 2023-02-25 LAB — CBC WITH DIFFERENTIAL/PLATELET
Basophils Absolute: 0 10*3/uL (ref 0.0–0.1)
Basophils Relative: 0.8 % (ref 0.0–3.0)
Eosinophils Absolute: 0.1 10*3/uL (ref 0.0–0.7)
Eosinophils Relative: 2.2 % (ref 0.0–5.0)
HCT: 38.4 % (ref 36.0–46.0)
Hemoglobin: 12.8 g/dL (ref 12.0–15.0)
Lymphocytes Relative: 34.7 % (ref 12.0–46.0)
Lymphs Abs: 1.1 10*3/uL (ref 0.7–4.0)
MCHC: 33.3 g/dL (ref 30.0–36.0)
MCV: 97.2 fl (ref 78.0–100.0)
Monocytes Absolute: 0.2 10*3/uL (ref 0.1–1.0)
Monocytes Relative: 6.7 % (ref 3.0–12.0)
Neutro Abs: 1.8 10*3/uL (ref 1.4–7.7)
Neutrophils Relative %: 55.6 % (ref 43.0–77.0)
Platelets: 218 10*3/uL (ref 150.0–400.0)
RBC: 3.95 Mil/uL (ref 3.87–5.11)
RDW: 14.4 % (ref 11.5–15.5)
WBC: 3.2 10*3/uL — ABNORMAL LOW (ref 4.0–10.5)

## 2023-02-25 LAB — T4, FREE: Free T4: 0.68 ng/dL (ref 0.60–1.60)

## 2023-02-25 LAB — TSH: TSH: 0.5 u[IU]/mL (ref 0.35–5.50)

## 2023-02-25 MED ORDER — DICLOFENAC SODIUM 75 MG PO TBEC
75.0000 mg | DELAYED_RELEASE_TABLET | Freq: Two times a day (BID) | ORAL | 2 refills | Status: DC
Start: 2023-02-25 — End: 2023-06-29

## 2023-02-25 NOTE — Assessment & Plan Note (Signed)
Chronic but progressing.  Checking stool studies today. Labs pending.  Consider referral back to GI for further evaluation.  Reviewed upper endoscopy and colonoscopy from 2023.

## 2023-02-25 NOTE — Assessment & Plan Note (Signed)
Well-controlled today.  Continue amlodipine 5 mg daily and olmesartan 40 mg daily.

## 2023-02-25 NOTE — Assessment & Plan Note (Addendum)
Chronic and continued.  She seems to hold back regarding how much and how frequently she drinks as I have had multiple conversations with her fianc who states that she drinks a moderate amount of liquor every day.  Discussed the absolute need for alcohol cessation.  Recommended rehab and alcoholic Anonymous for which she is not interested.  Strongly advised that she contact psychiatry as she was referred during her last visit.  She agrees to do so.  Will repeat liver ultrasound, LFTs, add hepatitis panel given weight loss.  Discontinue gabapentin as this is not effective.

## 2023-02-25 NOTE — Assessment & Plan Note (Signed)
Likely secondary to chronic alcohol abuse. Repeat liver US ordered and pending.  Colonoscopy and mammogram up-to-date. History of hysterectomy.  Checking labs today including hepatitis panel, CBC, CMP, thyroid studies, lipase.  She screened negative for HIV two years ago.  Referral placed to nutritionist.

## 2023-02-25 NOTE — Patient Instructions (Addendum)
Stop by the lab prior to leaving today. I will notify you of your results once received.   You will either be contacted via phone regarding your liver ultrasound, or you may receive a letter on your MyChart portal from our referral team with instructions for scheduling an appointment. Please let us know if you have not been contacted by anyone within two weeks.  You will either be contacted via phone regarding your referral to the nutritionist, or you may receive a letter on your MyChart portal from our referral team with instructions for scheduling an appointment. Please let us know if you have not been contacted by anyone within two weeks.  Please contact the psychiatry office as discussed.  Please consider alcohol rehab as discussed.  It was a pleasure to see you today!

## 2023-02-25 NOTE — Assessment & Plan Note (Signed)
Overall controlled except for insomnia at night.  Continue venlafaxine 37.5 mg daily. She will contact psychiatry for an appointment.

## 2023-02-25 NOTE — Progress Notes (Signed)
Subjective:    Patient ID: Kelly Walton, female    DOB: 1971/06/15, 52 y.o.   MRN: [id number]  HPI  Kelly Walton is a very pleasant 52 y.o. female with a history of alcohol abuse, asthma, hypertension, GAD, palpitations, insomnia, frequent headaches who presents today for follow up of anxiety, hypertension, and alcohol abuse.  1) Alcohol Abuse: Chronic history of alcohol abuse. During multiple prior visits she has denied regular use of alcohol but her fiance has stated that she continues to abuse alcohol on a daily basis. Currently managed on gabapentin 300 mg HS for alcohol cravings.  Today she discusses that gabapentin has not been effective in cravings.  She was referred to psychiatry in March 2024 for assistance in alcohol cravings/abuse, she has yet to call for an appointment.  Liver enzymes have historically been within normal range for the last few years, but she does have a history of elevated LFT's from 2021. She underwent liver biopsy in 2021 which revealed chronic hepatitis with mild to moderate activity, and moderate fibrosis.  No hepatitis lab work has been completed.  She continues to drink alcohol daily because she doesn't want to experience the withdrawal symptoms of tremors.  When she drinks, she drinks "4-5 shots a day".  She doesn't want to go to rehab.  She has not looked into Alcoholics Anonymous.  2) GAD: Chronic history of anxiety. Currently managed on venlafaxine ER 37.5 mg daily. She was referred to psychiatry in March 2024 given ongoing insomnia with mind racing thoughts. She had failed Zoloft and Trazodone previously.  She has yet to contact psychiatry but she plans on scheduling. She really doesn't much of an improvement in her sleep and mind racing thoughts.   3) Essential Hypertension: Currently managed on amlodipine 5 mg daily and olmesartan 40 mg daily.   She is not checking BP at home. She denies recurrent headaches, but she did have a headache on Sunday  this week which resolved by the following day.   BP Readings from Last 3 Encounters:  02/25/23 116/78  11/06/22 (!) 148/100  08/04/22 (!) 176/108    4) Chronic Diarrhea: Chronic for years, but over the last 1 month she's noticed 2-3 episodes of watery diarrhea daily.   She denies recent antibiotic use, abdominal pain, nausea and vomiting, rectal bleeding.   Diet currently consists of:  Breakfast: Skips, sometimes a smoothie.  Lunch: Sandwich, salad, or fruit Dinner: Chicken, meatloaf, burgers, steak Snacks: Chips, fruit Desserts: Infrequent Beverages: Alcohol and water.   Her portion sizes have decreased as she has no appetite. She has never smoked.   Evaluated by GI in 2023 for chronic diarrhea.  Underwent colonoscopy which was with normal mucosa throughout, internal/external nonthrombosed and nonbleeding hemorrhoids.  She underwent endoscopy which revealed gastritis throughout but without esophageal lesions or abnormality.  Wt Readings from Last 3 Encounters:  02/25/23 92 lb (41.7 kg)  11/06/22 104 lb (47.2 kg)  08/04/22 105 lb (47.6 kg)     Review of Systems  Constitutional:  Positive for appetite change and unexpected weight change.  Respiratory:  Negative for shortness of breath.   Cardiovascular:  Negative for chest pain.  Gastrointestinal:  Positive for diarrhea. Negative for abdominal pain, blood in stool, nausea and vomiting.  Neurological:  Positive for tremors.         Past Medical History:  Diagnosis Date   AC separation, right, initial encounter    Acute conjunctivitis of left eye 12/27/2020   Acute  right lower quadrant pain 06/10/2018   Alcohol abuse    Anxiety    Arthritis    Asthma    Hand injury, right, initial encounter 09/26/2019   History of fracture of clavicle 06/11/2020   Hypertension    Insomnia    Pain of upper abdomen 06/21/2017   Palpitations    a. 05/2019 Echo: EF 60-65%, diast dysfxn; b. 05/2019 Zio: Sinus rhythm-sinus tach. Avg HR  105. 6 SVT episodes - longest 7 beats. Rare PVCs-->Diltiazem added.   Periumbilical abdominal pain 03/12/2020   Spongiotic dermatitis     Social History   Socioeconomic History   Marital status: Single    Spouse name: Not on file   Number of children: 2   Years of education: Not on file   Highest education level: Not on file  Occupational History   Occupation: Event organiser: [employer]  Tobacco Use   Smoking status: Never   Smokeless tobacco: Never  Vaping Use   Vaping Use: Never used  Substance and Sexual Activity   Alcohol use: Yes    Comment: 2-3 per week   Drug use: No   Sexual activity: Not on file  Other Topics Concern   Not on file  Social History Narrative   Single.   2 children, 1 step-son.   Manager at [employer].   Enjoys going to [employer], spending time with family.    Social Determinants of Health   Financial Resource Strain: Not on file  Food Insecurity: Not on file  Transportation Needs: Not on file  Physical Activity: Not on file  Stress: Not on file  Social Connections: Not on file  Intimate Partner Violence: Not on file    Past Surgical History:  Procedure Laterality Date   CESAREAN SECTION     FOOT FRACTURE SURGERY Right    ORIF CLAVICULAR FRACTURE Right 06/17/2020   Procedure: OPEN REDUCTION INTERNAL FIXATION (ORIF) RIGHT CLAVICLE FRACTURE, ACROMIOCLAVICULAR SEPARATION;  Surgeon: Tarry Kos, MD;  Location: [facility];  Service: Orthopedics;  Laterality: Right;   ORIF CLAVICULAR FRACTURE Right 07/05/2020   Procedure: REVISION OPEN REDUCTION INTERNAL FIXATION (ORIF) RIGHT CLAVICLE FRACTURE, ACROMIOCLAVICULAR SEPARATION;  Surgeon: Tarry Kos, MD;  Location: [facility];  Service: Orthopedics;  Laterality: Right;   PERCUTANEOUS PINNING PHALANX FRACTURE OF HAND Right    ROBOTIC ASSISTED TOTAL HYSTERECTOMY N/A 03/05/2014   Procedure: ROBOTIC ASSISTED TOTAL HYSTERECTOMY/BILATERAL SALPINGECTOMY;  Surgeon: Serita Kyle, MD;  Location: [facility];  Service: Gynecology;  Laterality: N/A;   TUBAL LIGATION      Family History  Problem Relation Age of Onset   Arthritis Mother    Kidney disease Mother    Arthritis Father    Lung cancer Father    Heart attack Father    Lymphoma Father    Diabetes Father    Epilepsy Brother    Breast cancer Maternal Aunt    Hyperlipidemia Maternal Grandmother    Stroke Maternal Grandmother    Hypertension Maternal Grandmother    Heart attack Maternal Grandmother    Diabetes Maternal Grandmother    Alcohol abuse Paternal Grandmother    Arthritis Paternal Grandmother    Mental illness Paternal Grandmother    Lupus Son    Colon cancer Neg Hx    Esophageal cancer Neg Hx    Stomach cancer Neg Hx    Rectal cancer Neg Hx     Allergies  Allergen Reactions   Phenergan [Promethazine Hcl] Hives and Rash  Sulfa Antibiotics Hives and Rash    Pt states sent her to the hospital.   Trazodone And Nefazodone     Hallucinations     Current Outpatient Medications on File Prior to Visit  Medication Sig Dispense Refill   albuterol (VENTOLIN HFA) 108 (90 Base) MCG/ACT inhaler TAKE 2 PUFFS BY MOUTH EVERY 6 HOURS AS NEEDED FOR WHEEZE OR SHORTNESS OF BREATH 6.7 each 0   amLODipine (NORVASC) 5 MG tablet TAKE 1 TABLET BY MOUTH EVERY DAY FOR BLOOD PRESSURE 90 tablet 0   diclofenac (VOLTAREN) 75 MG EC tablet Take 1 tablet (75 mg total) by mouth 2 (two) times daily. 30 tablet 2   fluticasone (FLONASE) 50 MCG/ACT nasal spray PLACE 1 SPRAY INTO BOTH NOSTRILS 2 (TWO) TIMES DAILY 48 mL 1   hydrOXYzine (ATARAX) 10 MG tablet Take 1 tablet (10 mg total) by mouth at bedtime as needed for anxiety. 90 tablet 2   montelukast (SINGULAIR) 10 MG tablet TAKE 1 TABLET (10 MG TOTAL) BY MOUTH AT BEDTIME. FOR ALLERGIES/ASTHMA. 90 tablet 3   olmesartan (BENICAR) 40 MG tablet TAKE 1 TABLET (40 MG TOTAL) BY MOUTH DAILY FOR BLOOD PRESSURE 90 tablet 0   venlafaxine XR (EFFEXOR-XR) 37.5 MG 24 hr capsule TAKE 1 CAPSULE (37.5 MG TOTAL)  BY MOUTH DAILY WITH BREAKFAST. FOR ANXIETY AND HOT FLASHES 90 capsule 0   Vitamin D, Ergocalciferol, (DRISDOL) 1.25 MG (50000 UNIT) CAPS capsule Take 1 capsule by mouth once weekly for 12 weeks. 12 capsule 0   Budesonide (PULMICORT FLEXHALER) 90 MCG/ACT inhaler INHALE 1-2 PUFFS INTO THE LUNGS TWICE A DAY (Patient not taking: Reported on 08/04/2022) 1 each 2   No current facility-administered medications on file prior to visit.    BP 116/78   Pulse (!) 110   Temp 98.1 F (36.7 C) (Temporal)   Ht 5' 3.5" (1.613 m)   Wt 92 lb (41.7 kg)   LMP 03/05/2014 Comment: LMP x 52yrs ago   SpO2 95%   BMI 16.04 kg/m  Objective:   Physical Exam Cardiovascular:     Rate and Rhythm: Normal rate and regular rhythm.  Pulmonary:     Effort: Pulmonary effort is normal.     Breath sounds: Normal breath sounds.  Abdominal:     General: Bowel sounds are normal.     Palpations: Abdomen is soft.     Tenderness: There is no abdominal tenderness.  Musculoskeletal:     Cervical back: Neck supple.  Skin:    General: Skin is warm and dry.  Neurological:     Mental Status: She is alert and oriented to person, place, and time.  Psychiatric:        Mood and Affect: Mood normal.           Assessment & Plan:  Alcohol abuse Assessment & Plan: Chronic and continued.  She seems to hold back regarding how much and how frequently she drinks as I have had multiple conversations with her fianc who states that she drinks a moderate amount of liquor every day.  Discussed the absolute need for alcohol cessation.  Recommended rehab and alcoholic Anonymous for which she is not interested.  Strongly advised that she contact psychiatry as she was referred during her last visit.  She agrees to do so.  Will repeat liver ultrasound, LFTs, add hepatitis panel given weight loss.  Discontinue gabapentin as this is not effective.  Orders: -     Hepatitis panel, acute -     Hepatitis  C antibody -     Comprehensive  metabolic panel -     IBC + Ferritin -     US ABDOMEN LIMITED RUQ (LIVER/GB); Future  Unexplained weight loss Assessment & Plan: Likely secondary to chronic alcohol abuse. Repeat liver US ordered and pending.  Colonoscopy and mammogram up-to-date. History of hysterectomy.  Checking labs today including hepatitis panel, CBC, CMP, thyroid studies, lipase.  She screened negative for HIV two years ago.  Referral placed to nutritionist.  Orders: -     Hepatitis panel, acute -     Hepatitis C antibody -     TSH -     T4, free -     US ABDOMEN LIMITED RUQ (LIVER/GB); Future  Diarrhea, unspecified type Assessment & Plan: Chronic but progressing.  Checking stool studies today. Labs pending.  Consider referral back to GI for further evaluation.  Reviewed upper endoscopy and colonoscopy from 2023.  Orders: -     Comprehensive metabolic panel -     CBC with Differential/Platelet -     Lipase -     C. difficile GDH and Toxin A/B; Future -     Gastrointestinal Pathogen Pnl RT, PCR; Future -     Giardia antigen; Future  Essential hypertension Assessment & Plan: Well-controlled today.  Continue amlodipine 5 mg daily and olmesartan 40 mg daily.    Orders: -     US ABDOMEN LIMITED RUQ (LIVER/GB); Future  GAD (generalized anxiety disorder) Assessment & Plan: Overall controlled except for insomnia at night.  Continue venlafaxine 37.5 mg daily. She will contact psychiatry for an appointment.   Insomnia, unspecified type Assessment & Plan: Uncontrolled. Likely secondary to chronic alcohol abuse.  Strongly advised that she contact psychiatry as referred during her last visit.   Underweight -     Amb ref to Medical Nutrition Therapy-M[facility]        Doreene Nest, NP

## 2023-02-25 NOTE — Assessment & Plan Note (Signed)
Uncontrolled. Likely secondary to chronic alcohol abuse.  Strongly advised that she contact psychiatry as referred during her last visit.

## 2023-02-26 LAB — HEPATITIS PANEL, ACUTE
Hep A IgM: NONREACTIVE
Hep B C IgM: NONREACTIVE
Hepatitis B Surface Ag: NONREACTIVE
Hepatitis C Ab: NONREACTIVE

## 2023-02-28 ENCOUNTER — Other Ambulatory Visit: Payer: Self-pay | Admitting: Primary Care

## 2023-02-28 DIAGNOSIS — F411 Generalized anxiety disorder: Secondary | ICD-10-CM

## 2023-03-01 ENCOUNTER — Encounter: Payer: Self-pay | Admitting: Orthopaedic Surgery

## 2023-03-02 ENCOUNTER — Other Ambulatory Visit: Payer: Self-pay | Admitting: Orthopaedic Surgery

## 2023-03-02 MED ORDER — MELOXICAM 7.5 MG PO TABS: 7.5000 mg | ORAL_TABLET | Freq: Two times a day (BID) | ORAL | 2 refills | Status: DC | PRN

## 2023-03-02 NOTE — Progress Notes (Signed)
Meloxicam refilled  

## 2023-03-29 ENCOUNTER — Other Ambulatory Visit: Payer: No Typology Code available for payment source

## 2023-04-05 ENCOUNTER — Ambulatory Visit
Admission: RE | Admit: 2023-04-05 | Discharge: 2023-04-05 | Disposition: A | Discharge status: Home / Self Care | Payer: No Typology Code available for payment source | Source: Ambulatory Visit | Attending: Orthopaedic Surgery | Admitting: Orthopaedic Surgery

## 2023-04-05 ENCOUNTER — Ambulatory Visit
Admission: RE | Admit: 2023-04-05 | Discharge: 2023-04-05 | Disposition: A | Discharge status: Home / Self Care | Payer: No Typology Code available for payment source | Source: Ambulatory Visit | Attending: Primary Care | Admitting: Primary Care

## 2023-04-05 DIAGNOSIS — R634 Abnormal weight loss: Secondary | ICD-10-CM

## 2023-04-05 DIAGNOSIS — M898X1 Other specified disorders of bone, shoulder: Secondary | ICD-10-CM

## 2023-04-05 DIAGNOSIS — I10 Essential (primary) hypertension: Secondary | ICD-10-CM

## 2023-04-05 DIAGNOSIS — F1010 Alcohol abuse, uncomplicated: Secondary | ICD-10-CM

## 2023-04-09 ENCOUNTER — Encounter: Payer: Self-pay | Admitting: Orthopaedic Surgery

## 2023-04-12 ENCOUNTER — Other Ambulatory Visit: Payer: Self-pay | Admitting: Primary Care

## 2023-04-14 ENCOUNTER — Other Ambulatory Visit: Payer: Self-pay | Admitting: Primary Care

## 2023-04-14 DIAGNOSIS — F1010 Alcohol abuse, uncomplicated: Secondary | ICD-10-CM

## 2023-04-16 ENCOUNTER — Other Ambulatory Visit: Payer: Self-pay | Admitting: Primary Care

## 2023-04-24 ENCOUNTER — Other Ambulatory Visit: Payer: Self-pay | Admitting: Primary Care

## 2023-04-24 DIAGNOSIS — F411 Generalized anxiety disorder: Secondary | ICD-10-CM

## 2023-04-27 ENCOUNTER — Telehealth: Payer: Self-pay | Admitting: Orthopaedic Surgery

## 2023-04-27 ENCOUNTER — Ambulatory Visit: Payer: No Typology Code available for payment source | Admitting: Orthopaedic Surgery

## 2023-04-27 NOTE — Telephone Encounter (Signed)
Pt called in today she missed her appt this morning and was wondering if she could go over her MRI results over the phone

## 2023-04-27 NOTE — Telephone Encounter (Signed)
Called this evening and left a message

## 2023-04-29 ENCOUNTER — Telehealth: Payer: Self-pay | Admitting: Orthopaedic Surgery

## 2023-04-29 NOTE — Telephone Encounter (Signed)
Pt stated Dr. Roda Shutters called and left a vm to call back. Please give pt a phone call at [phone]

## 2023-05-03 NOTE — Telephone Encounter (Signed)
Left voice mail

## 2023-05-08 ENCOUNTER — Other Ambulatory Visit: Payer: Self-pay | Admitting: Primary Care

## 2023-05-08 DIAGNOSIS — I10 Essential (primary) hypertension: Secondary | ICD-10-CM

## 2023-05-20 ENCOUNTER — Telehealth: Payer: Self-pay | Admitting: Orthopaedic Surgery

## 2023-05-20 NOTE — Telephone Encounter (Signed)
Called and scheduled patient

## 2023-05-20 NOTE — Telephone Encounter (Signed)
Patient called in stating she is very bad pain with her right shoulder that she had surgery on. She is back in a sling. She wants to see if she is able to get an injection.CB#[phone]

## 2023-05-20 NOTE — Telephone Encounter (Signed)
sure

## 2023-05-23 ENCOUNTER — Other Ambulatory Visit: Payer: Self-pay | Admitting: Primary Care

## 2023-05-23 ENCOUNTER — Other Ambulatory Visit: Payer: Self-pay | Admitting: Orthopaedic Surgery

## 2023-05-23 DIAGNOSIS — J4520 Mild intermittent asthma, uncomplicated: Secondary | ICD-10-CM

## 2023-05-25 ENCOUNTER — Ambulatory Visit (INDEPENDENT_AMBULATORY_CARE_PROVIDER_SITE_OTHER): Payer: No Typology Code available for payment source | Admitting: Orthopaedic Surgery

## 2023-05-25 DIAGNOSIS — M898X1 Other specified disorders of bone, shoulder: Secondary | ICD-10-CM | POA: Diagnosis not present

## 2023-05-25 MED ORDER — LIDOCAINE HCL 1 % IJ SOLN
3.0000 mL | INTRAMUSCULAR | Status: AC | PRN
Start: 2023-05-25 — End: 2023-05-25
  Administered 2023-05-25: 3 mL

## 2023-05-25 MED ORDER — BUPIVACAINE HCL 0.5 % IJ SOLN
3.0000 mL | INTRAMUSCULAR | Status: AC | PRN
Start: 2023-05-25 — End: 2023-05-25
  Administered 2023-05-25: 3 mL via INTRA_ARTICULAR

## 2023-05-25 MED ORDER — METHYLPREDNISOLONE ACETATE 40 MG/ML IJ SUSP
40.0000 mg | INTRAMUSCULAR | Status: AC | PRN
Start: 2023-05-25 — End: 2023-05-25
  Administered 2023-05-25: 40 mg via INTRA_ARTICULAR

## 2023-05-25 NOTE — Progress Notes (Signed)
Office Visit Note   Patient: Kelly Walton           Date of Birth: [birth year]           MRN: [id number] Visit Date: 05/25/2023              Requested by: Doreene Nest, NP [address] [place],  [place] [address] PCP: Doreene Nest, NP   Assessment & Plan: Visit Diagnoses:  1. Pain of right clavicle     Plan: Kelly Walton is a 52 year old female with right shoulder pain.  Subacromial injection performed.  She tolerated this well.  Follow-Up Instructions: No follow-ups on file.   Orders:  No orders of the defined types were placed in this encounter.  No orders of the defined types were placed in this encounter.     Procedures: Large Joint Inj: R subacromial bursa on 05/25/2023 1:02 PM Indications: pain Details: 22 G needle  Arthrogram: No  Medications: 3 mL lidocaine 1 %; 3 mL bupivacaine 0.5 %; 40 mg methylPREDNISolone acetate 40 MG/ML Outcome: tolerated well, no immediate complications Consent was given by the patient. Patient was prepped and draped in the usual sterile fashion.       Clinical Data: No additional findings.   Subjective: Chief Complaint  Patient presents with   Right Shoulder - Pain    HPI Kelly Walton comes in today for right shoulder pain.  Requesting subacromial injection.  She had a CT scan that showed no acute abnormalities. Review of Systems   Objective: Vital Signs: LMP 03/05/2014 Comment: LMP x 73yrs ago   Physical Exam  Ortho Exam Examination of the right shoulder shows a fully healed surgical scar.  She does have new bruise and a scabbed abrasion which she denies any trauma to. Specialty Comments:  No specialty comments available.  Imaging: No results found.   PMFS History: Patient Active Problem List   Diagnosis Date Noted   Unexplained weight loss 02/25/2023   Vitamin D deficiency 11/06/2022   Elevated lipase 11/06/2022   Left upper quadrant abdominal pain 08/04/2022   Mass of soft tissue of abdomen 08/04/2022    GERD (gastroesophageal reflux disease) 04/07/2022   Frequent headaches 10/02/2021   Night sweats 10/02/2021   Dizziness 04/18/2021   Tachycardia 04/18/2021   Dyspareunia, female 04/24/2020   Diarrhea 03/12/2020   Toe pain, chronic, left 02/28/2020   GAD (generalized anxiety disorder) 01/02/2020   Alcohol abuse 01/02/2020   Palpitations 04/03/2019   Seasonal allergies 01/16/2019   Elevated LFTs 11/15/2018   Spongiotic dermatitis 07/08/2018   Hyperlipidemia 07/08/2018   Asthma 08/20/2017   Preventative health care 06/21/2017   Insomnia 04/16/2017   Essential hypertension 04/16/2017   Past Medical History:  Diagnosis Date   AC separation, right, initial encounter    Acute conjunctivitis of left eye 12/27/2020   Acute right lower quadrant pain 06/10/2018   Alcohol abuse    Anxiety    Arthritis    Asthma    Hand injury, right, initial encounter 09/26/2019   History of fracture of clavicle 06/11/2020   Hypertension    Insomnia    Pain of upper abdomen 06/21/2017   Palpitations    a. 05/2019 Echo: EF 60-65%, diast dysfxn; b. 05/2019 Zio: Sinus rhythm-sinus tach. Avg HR 105. 6 SVT episodes - longest 7 beats. Rare PVCs-->Diltiazem added.   Periumbilical abdominal pain 03/12/2020   Spongiotic dermatitis     Family History  Problem Relation Age of Onset   Arthritis  Mother    Kidney disease Mother    Arthritis Father    Lung cancer Father    Heart attack Father    Lymphoma Father    Diabetes Father    Epilepsy Brother    Breast cancer Maternal Aunt    Hyperlipidemia Maternal Grandmother    Stroke Maternal Grandmother    Hypertension Maternal Grandmother    Heart attack Maternal Grandmother    Diabetes Maternal Grandmother    Alcohol abuse Paternal Grandmother    Arthritis Paternal Grandmother    Mental illness Paternal Grandmother    Lupus Son    Colon cancer Neg Hx    Esophageal cancer Neg Hx    Stomach cancer Neg Hx    Rectal cancer Neg Hx     Past Surgical  History:  Procedure Laterality Date   CESAREAN SECTION     FOOT FRACTURE SURGERY Right    ORIF CLAVICULAR FRACTURE Right 06/17/2020   Procedure: OPEN REDUCTION INTERNAL FIXATION (ORIF) RIGHT CLAVICLE FRACTURE, ACROMIOCLAVICULAR SEPARATION;  Surgeon: Tarry Kos, MD;  Location: [facility];  Service: Orthopedics;  Laterality: Right;   ORIF CLAVICULAR FRACTURE Right 07/05/2020   Procedure: REVISION OPEN REDUCTION INTERNAL FIXATION (ORIF) RIGHT CLAVICLE FRACTURE, ACROMIOCLAVICULAR SEPARATION;  Surgeon: Tarry Kos, MD;  Location: [facility];  Service: Orthopedics;  Laterality: Right;   PERCUTANEOUS PINNING PHALANX FRACTURE OF HAND Right    ROBOTIC ASSISTED TOTAL HYSTERECTOMY N/A 03/05/2014   Procedure: ROBOTIC ASSISTED TOTAL HYSTERECTOMY/BILATERAL SALPINGECTOMY;  Surgeon: Serita Kyle, MD;  Location: [facility];  Service: Gynecology;  Laterality: N/A;   TUBAL LIGATION     Social History   Occupational History   Occupation: Event organiser: [employer]  Tobacco Use   Smoking status: Never   Smokeless tobacco: Never  Vaping Use   Vaping status: Never Used  Substance and Sexual Activity   Alcohol use: Yes    Comment: 2-3 per week   Drug use: No   Sexual activity: Not on file

## 2023-06-02 ENCOUNTER — Encounter: Payer: Self-pay | Admitting: Orthopaedic Surgery

## 2023-06-03 ENCOUNTER — Other Ambulatory Visit: Payer: Self-pay | Admitting: Primary Care

## 2023-06-03 DIAGNOSIS — F411 Generalized anxiety disorder: Secondary | ICD-10-CM

## 2023-06-03 MED ORDER — HYDROXYZINE HCL 10 MG PO TABS
10.0000 mg | ORAL_TABLET | Freq: Every evening | ORAL | 0 refills | Status: DC | PRN
Start: 2023-06-03 — End: 2023-07-19

## 2023-06-04 NOTE — Telephone Encounter (Signed)
Please order metal reduction MRI shoulder thanks.

## 2023-06-07 ENCOUNTER — Other Ambulatory Visit: Payer: Self-pay

## 2023-06-07 DIAGNOSIS — M898X1 Other specified disorders of bone, shoulder: Secondary | ICD-10-CM

## 2023-06-18 ENCOUNTER — Encounter: Payer: Self-pay | Admitting: Orthopaedic Surgery

## 2023-06-23 ENCOUNTER — Encounter: Payer: Self-pay | Admitting: Orthopaedic Surgery

## 2023-06-24 ENCOUNTER — Ambulatory Visit
Admission: RE | Admit: 2023-06-24 | Discharge: 2023-06-24 | Disposition: A | Discharge status: Home / Self Care | Payer: No Typology Code available for payment source | Source: Ambulatory Visit | Attending: Orthopaedic Surgery | Admitting: Orthopaedic Surgery

## 2023-06-24 DIAGNOSIS — M898X1 Other specified disorders of bone, shoulder: Secondary | ICD-10-CM

## 2023-06-29 ENCOUNTER — Encounter: Payer: Self-pay | Admitting: Primary Care

## 2023-06-29 ENCOUNTER — Ambulatory Visit (INDEPENDENT_AMBULATORY_CARE_PROVIDER_SITE_OTHER): Payer: No Typology Code available for payment source | Admitting: Primary Care

## 2023-06-29 VITALS — BP 122/82 | HR 82 | Temp 97.8°F | Ht 63.5 in | Wt 86.0 lb

## 2023-06-29 DIAGNOSIS — F411 Generalized anxiety disorder: Secondary | ICD-10-CM | POA: Diagnosis not present

## 2023-06-29 DIAGNOSIS — E559 Vitamin D deficiency, unspecified: Secondary | ICD-10-CM

## 2023-06-29 DIAGNOSIS — R21 Rash and other nonspecific skin eruption: Secondary | ICD-10-CM | POA: Diagnosis not present

## 2023-06-29 DIAGNOSIS — F1010 Alcohol abuse, uncomplicated: Secondary | ICD-10-CM

## 2023-06-29 DIAGNOSIS — R197 Diarrhea, unspecified: Secondary | ICD-10-CM

## 2023-06-29 LAB — CBC
HCT: 42.3 % (ref 36.0–46.0)
Hemoglobin: 14.1 g/dL (ref 12.0–15.0)
MCHC: 33.2 g/dL (ref 30.0–36.0)
MCV: 97.4 fL (ref 78.0–100.0)
Platelets: 234 10*3/uL (ref 150.0–400.0)
RBC: 4.35 Mil/uL (ref 3.87–5.11)
RDW: 15.3 % (ref 11.5–15.5)
WBC: 3.9 10*3/uL — ABNORMAL LOW (ref 4.0–10.5)

## 2023-06-29 LAB — COMPREHENSIVE METABOLIC PANEL
ALT: 47 U/L — ABNORMAL HIGH (ref 0–35)
AST: 70 U/L — ABNORMAL HIGH (ref 0–37)
Albumin: 4.1 g/dL (ref 3.5–5.2)
Alkaline Phosphatase: 77 U/L (ref 39–117)
BUN: 14 mg/dL (ref 6–23)
CO2: 31 meq/L (ref 19–32)
Calcium: 8.5 mg/dL (ref 8.4–10.5)
Chloride: 104 meq/L (ref 96–112)
Creatinine, Ser: 0.85 mg/dL (ref 0.40–1.20)
GFR: 79.38 mL/min (ref >60.00)
Glucose, Bld: 91 mg/dL (ref 70–99)
Potassium: 3.9 meq/L (ref 3.5–5.1)
Sodium: 144 meq/L (ref 135–145)
Total Bilirubin: 0.6 mg/dL (ref 0.2–1.2)
Total Protein: 6.5 g/dL (ref 6.0–8.3)

## 2023-06-29 LAB — VITAMIN D 25 HYDROXY (VIT D DEFICIENCY, FRACTURES): VITD: 28.01 ng/mL — ABNORMAL LOW (ref 30.00–100.00)

## 2023-06-29 MED ORDER — METHYLPREDNISOLONE ACETATE 80 MG/ML IJ SUSP
80.0000 mg | Freq: Once | INTRAMUSCULAR | Status: AC
Start: 2023-06-29 — End: 2023-06-29
  Administered 2023-06-29: 80 mg via INTRAMUSCULAR

## 2023-06-29 MED ORDER — PREDNISONE 20 MG PO TABS: ORAL_TABLET | ORAL | 0 refills | Status: DC

## 2023-06-29 NOTE — Assessment & Plan Note (Signed)
Referral placed again for psychiatry. Discussed instructions for scheduling.

## 2023-06-29 NOTE — Progress Notes (Signed)
Subjective:    Patient ID: Kelly Walton, female    DOB: 1971-04-19, 52 y.o.   MRN: [id number]  Rash Pertinent negatives include no shortness of breath.    Kelly Walton is a very pleasant 52 y.o. female with a history of hypertension, asthma, alcohol abuse, GAD, frequent headaches, insomnia who presents today to discuss rash and follow up of alcohol abuse.  Her fiance joins [place] today.  1) Rash: Her rash is located to her entire body which initially began a few years ago. Evaluated by dermatology at the time, underwent skin prick testing and was negative for anything common. Over the years her rash will wax and wane for months. Over the last 3 months her rash has been persistent and itchy to her entire body.   She's been doing oatmeal baths, switched all detergents to mild and unscented. No improvement.   No new lotions, detergents, soaps or shampoos. No new medicines, vitamins, supplements. No new pets. No recent outdoor exposure or poison ivy exposure. No bonfire or smoke exposure.  No recent motel or hotel stay or new beds.   No fevers/chills, oral lesions, new joint pains. She's had several tick bites over the summer.   She is managed on hydroxyzine for which she takes frequently as needed for anxiety. Also managed on Singulair for chronic allergies and asthma.   BP Readings from Last 3 Encounters:  06/29/23 122/82  02/25/23 116/78  11/06/22 (!) 148/100   2) Alcohol Abuse: Chronic. Waxes and wanes with days of binge drinking. Typically drinks liquor. Her busy work schedule has helped her to reduce drinking. She will have 1-2 shots in the evening when coming home from work. If she has a few days off, then she will drink until she passes out.   Referred to psychiatry in March 2024 for anxiety, insomnia, and alcohol dependence. She never connected. She is interested in still seeing psychiatry.   Review of Systems  Respiratory:  Negative for shortness of breath and wheezing.    Cardiovascular:  Negative for chest pain.  Skin:  Positive for rash.  Psychiatric/Behavioral:  Positive for sleep disturbance. The patient is nervous/anxious.          Past Medical History:  Diagnosis Date   AC separation, right, initial encounter    Acute conjunctivitis of left eye 12/27/2020   Acute right lower quadrant pain 06/10/2018   Alcohol abuse    Anxiety    Arthritis    Asthma    Hand injury, right, initial encounter 09/26/2019   History of fracture of clavicle 06/11/2020   Hypertension    Insomnia    Pain of upper abdomen 06/21/2017   Palpitations    a. 05/2019 Echo: EF 60-65%, diast dysfxn; b. 05/2019 Zio: Sinus rhythm-sinus tach. Avg HR 105. 6 SVT episodes - longest 7 beats. Rare PVCs-->Diltiazem added.   Periumbilical abdominal pain 03/12/2020   Spongiotic dermatitis     Social History   Socioeconomic History   Marital status: Single    Spouse name: Not on file   Number of children: 2   Years of education: Not on file   Highest education level: Not on file  Occupational History   Occupation: Event organiser: [employer]  Tobacco Use   Smoking status: Never   Smokeless tobacco: Never  Vaping Use   Vaping status: Never Used  Substance and Sexual Activity   Alcohol use: Yes    Comment: 2-3 per week   Drug use:  No   Sexual activity: Not on file  Other Topics Concern   Not on file  Social History Narrative   Single.   2 children, 1 step-son.   Manager at [employer].   Enjoys going to [employer], spending time with family.    Social Determinants of Health   Financial Resource Strain: Not on file  Food Insecurity: Not on file  Transportation Needs: Not on file  Physical Activity: Not on file  Stress: Not on file  Social Connections: Unknown (01/17/2022)   Received from [facility]   Social Network    Social Network: Not on file  Intimate Partner Violence: Unknown (12/10/2021)   Received from [facility]   HITS    Physically  Hurt: Not on file    Insult or Talk Down To: Not on file    Threaten Physical Harm: Not on file    Scream or Curse: Not on file    Past Surgical History:  Procedure Laterality Date   CESAREAN SECTION     FOOT FRACTURE SURGERY Right    ORIF CLAVICULAR FRACTURE Right 06/17/2020   Procedure: OPEN REDUCTION INTERNAL FIXATION (ORIF) RIGHT CLAVICLE FRACTURE, ACROMIOCLAVICULAR SEPARATION;  Surgeon: Tarry Kos, MD;  Location: [facility];  Service: Orthopedics;  Laterality: Right;   ORIF CLAVICULAR FRACTURE Right 07/05/2020   Procedure: REVISION OPEN REDUCTION INTERNAL FIXATION (ORIF) RIGHT CLAVICLE FRACTURE, ACROMIOCLAVICULAR SEPARATION;  Surgeon: Tarry Kos, MD;  Location: [facility];  Service: Orthopedics;  Laterality: Right;   PERCUTANEOUS PINNING PHALANX FRACTURE OF HAND Right    ROBOTIC ASSISTED TOTAL HYSTERECTOMY N/A 03/05/2014   Procedure: ROBOTIC ASSISTED TOTAL HYSTERECTOMY/BILATERAL SALPINGECTOMY;  Surgeon: Serita Kyle, MD;  Location: [facility];  Service: Gynecology;  Laterality: N/A;   TUBAL LIGATION      Family History  Problem Relation Age of Onset   Arthritis Mother    Kidney disease Mother    Arthritis Father    Lung cancer Father    Heart attack Father    Lymphoma Father    Diabetes Father    Epilepsy Brother    Breast cancer Maternal Aunt    Hyperlipidemia Maternal Grandmother    Stroke Maternal Grandmother    Hypertension Maternal Grandmother    Heart attack Maternal Grandmother    Diabetes Maternal Grandmother    Alcohol abuse Paternal Grandmother    Arthritis Paternal Grandmother    Mental illness Paternal Grandmother    Lupus Son    Colon cancer Neg Hx    Esophageal cancer Neg Hx    Stomach cancer Neg Hx    Rectal cancer Neg Hx     Allergies  Allergen Reactions   Phenergan [Promethazine Hcl] Hives and Rash   Sulfa Antibiotics Hives and Rash    Pt states sent her to the hospital.   Trazodone And Nefazodone     Hallucinations      Current Outpatient Medications on File Prior to Visit  Medication Sig Dispense Refill   albuterol (VENTOLIN HFA) 108 (90 Base) MCG/ACT inhaler TAKE 2 PUFFS BY MOUTH EVERY 6 HOURS AS NEEDED FOR WHEEZE OR SHORTNESS OF BREATH 6.7 each 0   amLODipine (NORVASC) 5 MG tablet TAKE 1 TABLET BY MOUTH EVERY DAY FOR BLOOD PRESSURE 90 tablet 2   fluticasone (FLONASE) 50 MCG/ACT nasal spray PLACE 1 SPRAY INTO BOTH NOSTRILS 2 (TWO) TIMES DAILY 48 mL 1   hydrOXYzine (ATARAX) 10 MG tablet Take 1 tablet (10 mg total) by mouth at  bedtime as needed for anxiety. 90 tablet 0   meloxicam (MOBIC) 7.5 MG tablet Take 1 tablet (7.5 mg total) by mouth 2 (two) times daily as needed for pain. 30 tablet 2   montelukast (SINGULAIR) 10 MG tablet TAKE 1 TABLET BY MOUTH AT BEDTIME FOR ALLERGIES/ASTHMA 90 tablet 1   olmesartan (BENICAR) 40 MG tablet TAKE 1 TABLET (40 MG TOTAL) BY MOUTH DAILY FOR BLOOD PRESSURE 90 tablet 0   Budesonide (PULMICORT FLEXHALER) 90 MCG/ACT inhaler INHALE 1-2 PUFFS INTO THE LUNGS TWICE A DAY (Patient not taking: Reported on 06/29/2023) 1 each 2   venlafaxine XR (EFFEXOR-XR) 37.5 MG 24 hr capsule TAKE 1 CAPSULE (37.5 MG TOTAL) BY MOUTH DAILY WITH BREAKFAST. FOR ANXIETY AND HOT FLASHES (Patient not taking: Reported on 06/29/2023) 90 capsule 0   No current facility-administered medications on file prior to visit.    BP 122/82   Pulse 82   Temp 97.8 F (36.6 C) (Temporal)   Ht 5' 3.5" (1.613 m)   Wt 86 lb (39 kg)   LMP 03/05/2014 Comment: LMP x 81yrs ago   SpO2 99%   BMI 15.00 kg/m  Objective:   Physical Exam Cardiovascular:     Rate and Rhythm: Normal rate and regular rhythm.  Pulmonary:     Effort: Pulmonary effort is normal.     Breath sounds: Normal breath sounds.  Abdominal:     Palpations: Abdomen is soft.     Tenderness: There is no abdominal tenderness.  Musculoskeletal:     Cervical back: Neck supple.  Skin:    General: Skin is warm and dry.     Findings: Rash present.      Comments: Wide spread maculopapular rash to entire body excluding face.  Dry, flaking skin to posterior trunk  Neurological:     Mental Status: She is alert and oriented to person, place, and time.  Psychiatric:        Mood and Affect: Mood normal.           Assessment & Plan:  Rash and other nonspecific skin eruption Assessment & Plan: Exam and HPI representative of allergic reaction. As this has been going on for years, unclear etiology.  Checking labs today including alpha gal panel, food allergy panel, CBC, CMP.  Consider discontinuation of amlodipine.  IM Depo-Medrol 80 mg provided today. Start prednisone 20 mg course.  60 mg x 3 days, 40 mg x 3 days, 20 mg x 3 days.  Consider allergist referral. Consider addition of famotidine 20 to 40 mg daily.  Orders: -     Food Allergy Profile -     Alpha-Gal Panel -     predniSONE; Take 3 tablets daily in the morning for 3 days, then 2 tablets for 3 days, then 1 tablet for 3 days.  Dispense: 18 tablet; Refill: 0 -     methylPREDNISolone Acetate  Alcohol abuse Assessment & Plan: Improving.  Referral placed again for psychiatry.   Discussed instructions for scheduling.  Orders: -     Comprehensive metabolic panel -     CBC  GAD (generalized anxiety disorder) Assessment & Plan: Referral placed again for psychiatry. Discussed instructions for scheduling.  Orders: -     Ambulatory referral to Psychiatry  Vitamin D deficiency -     VITAMIN D 25 Hydroxy (Vit-D Deficiency, Fractures)  Diarrhea, unspecified type -     Giardia antigen -     Gastrointestinal Pathogen Pnl RT, PCR -     C.  difficile GDH and Toxin A/B        Doreene Nest, NP

## 2023-06-29 NOTE — Assessment & Plan Note (Signed)
Exam and HPI representative of allergic reaction. As this has been going on for years, unclear etiology.  Checking labs today including alpha gal panel, food allergy panel, CBC, CMP.  Consider discontinuation of amlodipine.  IM Depo-Medrol 80 mg provided today. Start prednisone 20 mg course.  60 mg x 3 days, 40 mg x 3 days, 20 mg x 3 days.  Consider allergist referral. Consider addition of famotidine 20 to 40 mg daily.

## 2023-06-29 NOTE — Assessment & Plan Note (Signed)
Improving.  Referral placed again for psychiatry.   Discussed instructions for scheduling.

## 2023-06-29 NOTE — Patient Instructions (Addendum)
Stop by the lab prior to leaving today. I will notify you of your results once received.   You will either be contacted via phone regarding your referral to psychiatry, or you may receive a letter on your MyChart portal from our referral team with instructions for scheduling an appointment. Please let us know if you have not been contacted by anyone within two weeks.  Call the nutritionist for the appointment.  Take 3 tablets daily in the morning for 3 days, then 2 tablets for 3 days, then 1 tablet for 3 days.  It was a pleasure to see you today!

## 2023-06-30 ENCOUNTER — Ambulatory Visit (INDEPENDENT_AMBULATORY_CARE_PROVIDER_SITE_OTHER): Payer: No Typology Code available for payment source

## 2023-06-30 ENCOUNTER — Other Ambulatory Visit: Payer: Self-pay | Admitting: Primary Care

## 2023-06-30 DIAGNOSIS — F1010 Alcohol abuse, uncomplicated: Secondary | ICD-10-CM

## 2023-06-30 LAB — C. DIFFICILE GDH AND TOXIN A/B
GDH ANTIGEN: NOT DETECTED
MICRO NUMBER:: 15626797
SPECIMEN QUALITY:: ADEQUATE
TOXIN A AND B: NOT DETECTED

## 2023-06-30 LAB — FOLATE: Folate: 2.8 ng/mL — ABNORMAL LOW (ref >5.9)

## 2023-07-02 LAB — GASTROINTESTINAL PATHOGEN PNL
CampyloBacter Group: NOT DETECTED
Norovirus GI/GII: NOT DETECTED
Rotavirus A: NOT DETECTED
Salmonella species: NOT DETECTED
Shiga Toxin 1: NOT DETECTED
Shiga Toxin 2: NOT DETECTED
Shigella Species: NOT DETECTED
Vibrio Group: NOT DETECTED
Yersinia enterocolitica: NOT DETECTED

## 2023-07-02 LAB — GIARDIA ANTIGEN
MICRO NUMBER:: 15626782
RESULT:: NOT DETECTED
SPECIMEN QUALITY:: ADEQUATE

## 2023-07-05 DIAGNOSIS — R636 Underweight: Secondary | ICD-10-CM

## 2023-07-05 DIAGNOSIS — E440 Moderate protein-calorie malnutrition: Secondary | ICD-10-CM

## 2023-07-06 ENCOUNTER — Ambulatory Visit (INDEPENDENT_AMBULATORY_CARE_PROVIDER_SITE_OTHER): Payer: No Typology Code available for payment source | Admitting: Orthopaedic Surgery

## 2023-07-06 DIAGNOSIS — M25511 Pain in right shoulder: Secondary | ICD-10-CM | POA: Diagnosis not present

## 2023-07-06 DIAGNOSIS — G8929 Other chronic pain: Secondary | ICD-10-CM

## 2023-07-06 LAB — FOOD ALLERGY PROFILE
Allergen, Salmon, f41: <0.1 kU/L
Almonds: <0.1 kU/L
CLASS: 0
CLASS: 0
CLASS: 0
CLASS: 0
CLASS: 0
CLASS: 0
CLASS: 0
CLASS: 0
CLASS: 0
CLASS: 0
CLASS: 0
Cashew IgE: <0.1 kU/L
Class: 0
Class: 0
Class: 0
Class: 0
Egg White IgE: <0.1 kU/L
Fish Cod: <0.1 kU/L
Hazelnut: <0.1 kU/L
Milk IgE: <0.1 kU/L
Peanut IgE: <0.1 kU/L
Scallop IgE: <0.1 kU/L
Sesame Seed f10: <0.1 kU/L
Shrimp IgE: <0.1 kU/L
Soybean IgE: <0.1 kU/L
Tuna IgE: <0.1 kU/L
Walnut: <0.1 kU/L
Wheat IgE: <0.1 kU/L

## 2023-07-06 LAB — ALPHA-GAL PANEL
Allergen, Mutton, f88: <0.1 kU/L
Allergen, Pork, f26: <0.1 kU/L
Beef: <0.1 kU/L
CLASS: 0
CLASS: 0
Class: 0
GALACTOSE-ALPHA-1,3-GALACTOSE IGE*: <0.1 kU/L (ref <0.10)

## 2023-07-06 LAB — INTERPRETATION:

## 2023-07-06 NOTE — Progress Notes (Signed)
Office Visit Note   Patient: Kelly Walton           Date of Birth: October 04, 1970           MRN: [id number] Visit Date: 07/06/2023              Requested by: Doreene Nest, NP [address] [place],  [place] [address] PCP: Doreene Nest, NP   Assessment & Plan: Visit Diagnoses:  1. Chronic right shoulder pain     Plan: MRI of the right shoulder shows nonspecific bony edema in the proximal humerus and mild tendinosis of the supraspinatus tendon.  There is no hardware complications or structural abnormalities.  These findings were reviewed with Kelly Walton.  Based on these findings I recommend continuing with symptomatic management with NSAIDs and activity modification as needed.  She reports that the pain is not constant mainly exacerbated by repetitive activity at work.  Follow-Up Instructions: No follow-ups on file.   Orders:  No orders of the defined types were placed in this encounter.  No orders of the defined types were placed in this encounter.     Procedures: No procedures performed   Clinical Data: No additional findings.   Subjective: Chief Complaint  Patient presents with   Right Shoulder - Follow-up    MRI Review    HPI Kelly Walton returns today to discuss right shoulder MRI scan.  Reports that the pain is not constant and is usually exacerbated by repetitive activity and use of the arm at work. Review of Systems   Objective: Vital Signs: LMP 03/05/2014 Comment: LMP x 80yrs ago   Physical Exam  Ortho Exam Exam of the right shoulder is unchanged from prior visit. Specialty Comments:  No specialty comments available.  Imaging: No results found.   PMFS History: Patient Active Problem List   Diagnosis Date Noted   Rash and other nonspecific skin eruption 06/29/2023   Unexplained weight loss 02/25/2023   Vitamin D deficiency 11/06/2022   Elevated lipase 11/06/2022   Left upper quadrant abdominal pain 08/04/2022   Mass of soft tissue of abdomen  08/04/2022   GERD (gastroesophageal reflux disease) 04/07/2022   Frequent headaches 10/02/2021   Night sweats 10/02/2021   Dizziness 04/18/2021   Tachycardia 04/18/2021   Dyspareunia, female 04/24/2020   Diarrhea 03/12/2020   Toe pain, chronic, left 02/28/2020   GAD (generalized anxiety disorder) 01/02/2020   Alcohol abuse 01/02/2020   Palpitations 04/03/2019   Seasonal allergies 01/16/2019   Elevated LFTs 11/15/2018   Spongiotic dermatitis 07/08/2018   Hyperlipidemia 07/08/2018   Asthma 08/20/2017   Preventative health care 06/21/2017   Insomnia 04/16/2017   Essential hypertension 04/16/2017   Past Medical History:  Diagnosis Date   AC separation, right, initial encounter    Acute conjunctivitis of left eye 12/27/2020   Acute right lower quadrant pain 06/10/2018   Alcohol abuse    Anxiety    Arthritis    Asthma    Hand injury, right, initial encounter 09/26/2019   History of fracture of clavicle 06/11/2020   Hypertension    Insomnia    Pain of upper abdomen 06/21/2017   Palpitations    a. 05/2019 Echo: EF 60-65%, diast dysfxn; b. 05/2019 Zio: Sinus rhythm-sinus tach. Avg HR 105. 6 SVT episodes - longest 7 beats. Rare PVCs-->Diltiazem added.   Periumbilical abdominal pain 03/12/2020   Spongiotic dermatitis     Family History  Problem Relation Age of Onset   Arthritis Mother  Kidney disease Mother    Arthritis Father    Lung cancer Father    Heart attack Father    Lymphoma Father    Diabetes Father    Epilepsy Brother    Breast cancer Maternal Aunt    Hyperlipidemia Maternal Grandmother    Stroke Maternal Grandmother    Hypertension Maternal Grandmother    Heart attack Maternal Grandmother    Diabetes Maternal Grandmother    Alcohol abuse Paternal Grandmother    Arthritis Paternal Grandmother    Mental illness Paternal Grandmother    Lupus Son    Colon cancer Neg Hx    Esophageal cancer Neg Hx    Stomach cancer Neg Hx    Rectal cancer Neg Hx     Past  Surgical History:  Procedure Laterality Date   CESAREAN SECTION     FOOT FRACTURE SURGERY Right    ORIF CLAVICULAR FRACTURE Right 06/17/2020   Procedure: OPEN REDUCTION INTERNAL FIXATION (ORIF) RIGHT CLAVICLE FRACTURE, ACROMIOCLAVICULAR SEPARATION;  Surgeon: Tarry Kos, MD;  Location: [facility];  Service: Orthopedics;  Laterality: Right;   ORIF CLAVICULAR FRACTURE Right 07/05/2020   Procedure: REVISION OPEN REDUCTION INTERNAL FIXATION (ORIF) RIGHT CLAVICLE FRACTURE, ACROMIOCLAVICULAR SEPARATION;  Surgeon: Tarry Kos, MD;  Location: [facility];  Service: Orthopedics;  Laterality: Right;   PERCUTANEOUS PINNING PHALANX FRACTURE OF HAND Right    ROBOTIC ASSISTED TOTAL HYSTERECTOMY N/A 03/05/2014   Procedure: ROBOTIC ASSISTED TOTAL HYSTERECTOMY/BILATERAL SALPINGECTOMY;  Surgeon: Serita Kyle, MD;  Location: [facility];  Service: Gynecology;  Laterality: N/A;   TUBAL LIGATION     Social History   Occupational History   Occupation: Event organiser: [employer]  Tobacco Use   Smoking status: Never   Smokeless tobacco: Never  Vaping Use   Vaping status: Never Used  Substance and Sexual Activity   Alcohol use: Yes    Comment: 2-3 per week   Drug use: No   Sexual activity: Not on file

## 2023-07-09 DIAGNOSIS — F1010 Alcohol abuse, uncomplicated: Secondary | ICD-10-CM

## 2023-07-19 ENCOUNTER — Ambulatory Visit (INDEPENDENT_AMBULATORY_CARE_PROVIDER_SITE_OTHER): Payer: No Typology Code available for payment source | Admitting: Behavioral Health

## 2023-07-19 ENCOUNTER — Encounter: Payer: Self-pay | Admitting: Behavioral Health

## 2023-07-19 VITALS — BP 154/95 | HR 113 | Ht 64.0 in | Wt 85.0 lb

## 2023-07-19 DIAGNOSIS — F331 Major depressive disorder, recurrent, moderate: Secondary | ICD-10-CM

## 2023-07-19 DIAGNOSIS — F99 Mental disorder, not otherwise specified: Secondary | ICD-10-CM

## 2023-07-19 DIAGNOSIS — F1011 Alcohol abuse, in remission: Secondary | ICD-10-CM

## 2023-07-19 DIAGNOSIS — F411 Generalized anxiety disorder: Secondary | ICD-10-CM

## 2023-07-19 DIAGNOSIS — F5105 Insomnia due to other mental disorder: Secondary | ICD-10-CM

## 2023-07-19 DIAGNOSIS — R636 Underweight: Secondary | ICD-10-CM | POA: Diagnosis not present

## 2023-07-19 DIAGNOSIS — I10 Essential (primary) hypertension: Secondary | ICD-10-CM

## 2023-07-19 MED ORDER — MIRTAZAPINE 15 MG PO TABS
15.0000 mg | ORAL_TABLET | Freq: Every day | ORAL | 1 refills | Status: DC
Start: 2023-07-19 — End: 2023-08-19

## 2023-07-19 MED ORDER — ESCITALOPRAM OXALATE 5 MG PO TABS: 5.0000 mg | ORAL_TABLET | Freq: Every day | ORAL | 1 refills | Status: DC

## 2023-07-19 MED ORDER — NALTREXONE HCL 50 MG PO TABS
50.0000 mg | ORAL_TABLET | Freq: Every day | ORAL | 1 refills | Status: DC
Start: 2023-07-19 — End: 2023-08-19

## 2023-07-19 MED ORDER — HYDROXYZINE HCL 10 MG PO TABS: 10.0000 mg | ORAL_TABLET | Freq: Every evening | ORAL | 1 refills | Status: DC | PRN

## 2023-07-19 NOTE — Progress Notes (Signed)
[facility] MD/PA/NP Initial Note  07/19/2023 11:36 AM Kelly Walton  MRN:  [id number]  Chief Complaint:  Chief Complaint   Anxiety; Depression; Patient Education; Medication Refill; Establish Care; Alcohol Problem; Stress     HPI:  "Kelly Walton", 52 year old female presents to this office for initial visit and to establish care.  Collateral information should be considered reliable.  She appears anxious and states that she "came very close to leaving before she was called back to be seen".  She is very candid about prior history of substance abuse through her teen  years and early 93s.  Admits to having problems with EtOH but says that she stopped cold [place] approximately 2 months ago.  Assures me that she has not had any alcohol since then.  Demonstrates readiness to stop drinking altogether.  She says that her depression level right now is 7/10 and anxiety is 5/10.  Says that she realizes she was using alcohol to self medicate.  She is here today from referral from her PCP to get additional help.  Says that she does struggle with poor sleep quality and has been using hydroxyzine to assist.  She is not happy with her weight at 85 pounds and acknowledges poor appetite and diet.  She is willing to try new medication to help with her mental health concerns.  She is also interested in seeing a therapist.  She currently lives in [place] with her significant other of 14 years.  Her mother and one of her grown sons also lives in the home.  She is currently on leave of absence from work.  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  She denies any history of mania, no psychosis, no auditory or visual hallucinations.  No current SI or HI.  Patient states that she feels safe and verbally contracts for safety with this Clinical research associate.  Strong family support from her husband and friends.  Past psychiatric medication trials: And laxative seen Ambien Trazodone- hallucinations, listed as  allergy Hydroxyzine  Visit Diagnosis:    ICD-10-CM   1. Essential hypertension  I10     2. Underweight  R63.6 mirtazapine (REMERON) 15 MG tablet    3. Generalized anxiety disorder  F41.1 naltrexone (DEPADE) 50 MG tablet    escitalopram (LEXAPRO) 5 MG tablet    4. Major depressive disorder, recurrent episode, moderate (HCC)  F33.1 escitalopram (LEXAPRO) 5 MG tablet    5. History of alcohol abuse  F10.11 naltrexone (DEPADE) 50 MG tablet    6. Insomnia due to other mental disorder  F51.05 mirtazapine (REMERON) 15 MG tablet   F99     7. GAD (generalized anxiety disorder)  F41.1 hydrOXYzine (ATARAX) 10 MG tablet      Past Psychiatric History: ETOH Abuse, Hx of substance abuse, Depression   Past Medical History:  Past Medical History:  Diagnosis Date   AC separation, right, initial encounter    Acute conjunctivitis of left eye 12/27/2020   Acute right lower quadrant pain 06/10/2018   Alcohol abuse    Anxiety    Arthritis    Asthma    Hand injury, right, initial encounter 09/26/2019   History of fracture of clavicle 06/11/2020   Hypertension    Insomnia    Pain of upper abdomen 06/21/2017   Palpitations    a. 05/2019 Echo: EF 60-65%, diast dysfxn; b. 05/2019 Zio: Sinus rhythm-sinus tach. Avg HR 105. 6 SVT episodes - longest 7 beats. Rare PVCs-->Diltiazem added.  Periumbilical abdominal pain 03/12/2020   Spongiotic dermatitis     Past Surgical History:  Procedure Laterality Date   CESAREAN SECTION     FOOT FRACTURE SURGERY Right    ORIF CLAVICULAR FRACTURE Right 06/17/2020   Procedure: OPEN REDUCTION INTERNAL FIXATION (ORIF) RIGHT CLAVICLE FRACTURE, ACROMIOCLAVICULAR SEPARATION;  Surgeon: Tarry Kos, MD;  Location: [facility];  Service: Orthopedics;  Laterality: Right;   ORIF CLAVICULAR FRACTURE Right 07/05/2020   Procedure: REVISION OPEN REDUCTION INTERNAL FIXATION (ORIF) RIGHT CLAVICLE FRACTURE, ACROMIOCLAVICULAR SEPARATION;  Surgeon: Tarry Kos, MD;   Location: [facility];  Service: Orthopedics;  Laterality: Right;   PERCUTANEOUS PINNING PHALANX FRACTURE OF HAND Right    ROBOTIC ASSISTED TOTAL HYSTERECTOMY N/A 03/05/2014   Procedure: ROBOTIC ASSISTED TOTAL HYSTERECTOMY/BILATERAL SALPINGECTOMY;  Surgeon: Serita Kyle, MD;  Location: [facility];  Service: Gynecology;  Laterality: N/A;   TUBAL LIGATION      Family Psychiatric History: see chart  Family History:  Family History  Problem Relation Age of Onset   Arthritis Mother    Kidney disease Mother    Alcohol abuse Father    Arthritis Father    Lung cancer Father    Heart attack Father    Lymphoma Father    Diabetes Father    Epilepsy Brother    Breast cancer Maternal Aunt    Hyperlipidemia Maternal Grandmother    Stroke Maternal Grandmother    Hypertension Maternal Grandmother    Heart attack Maternal Grandmother    Diabetes Maternal Grandmother    Alcohol abuse Paternal Grandmother    Arthritis Paternal Grandmother    Mental illness Paternal Grandmother    Lupus Son    Colon cancer Neg Hx    Esophageal cancer Neg Hx    Stomach cancer Neg Hx    Rectal cancer Neg Hx     Social History:  Social History   Socioeconomic History   Marital status: Significant Other    Spouse name: [name]   Number of children: 2   Years of education: !2   Highest education level: GED or equivalent  Occupational History   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 Pharm Tech  Tobacco Use   Smoking status: Never   Smokeless tobacco: Never  Vaping Use   Vaping status: Never Used  Substance and Sexual Activity   Alcohol use: Yes    Comment: no ETOH in 2 months   Drug use: No   Sexual activity: Yes  Other Topics Concern   Not on file  Social History Narrative   Long term 14 year relationship   2 children, 1 step-son.   Lives in [place] with significant other, mother, and son.   Enjoys going to beach, spending time with family.    Social  Determinants of Health   Financial Resource Strain: Not on file  Food Insecurity: Not on file  Transportation Needs: Not on file  Physical Activity: Not on file  Stress: Not on file  Social Connections: Unknown (01/17/2022)   Received from [facility]   Social Network    Social Network: Not on file    Allergies:  Allergies  Allergen Reactions   Phenergan [Promethazine Hcl] Hives and Rash   Sulfa Antibiotics Hives and Rash    Pt states sent her to the hospital.   Trazodone And Nefazodone     Hallucinations     Metabolic Disorder Labs: Lab Results  Component Value  Date   HGBA1C 5.3 04/07/2022   No results found for: "PROLACTIN" Lab Results  Component Value Date   CHOL 270 (H) 04/07/2022   TRIG 194.0 (H) 04/07/2022   HDL 140.80 04/07/2022   CHOLHDL 2 04/07/2022   VLDL 38.8 04/07/2022   LDLCALC 90 04/07/2022   LDLCALC 116 (H) 06/21/2017   Lab Results  Component Value Date   TSH 0.50 02/25/2023   TSH 0.38 08/04/2022    Therapeutic Level Labs: No results found for: "LITHIUM" No results found for: "VALPROATE" No results found for: "CBMZ"  Current Medications: Current Outpatient Medications  Medication Sig Dispense Refill   amLODipine (NORVASC) 5 MG tablet TAKE 1 TABLET BY MOUTH EVERY DAY FOR BLOOD PRESSURE 90 tablet 2   Budesonide (PULMICORT FLEXHALER) 90 MCG/ACT inhaler INHALE 1-2 PUFFS INTO THE LUNGS TWICE A DAY 1 each 2   escitalopram (LEXAPRO) 5 MG tablet Take 1 tablet (5 mg total) by mouth daily. 30 tablet 1   fluticasone (FLONASE) 50 MCG/ACT nasal spray PLACE 1 SPRAY INTO BOTH NOSTRILS 2 (TWO) TIMES DAILY 48 mL 1   mirtazapine (REMERON) 15 MG tablet Take 1 tablet (15 mg total) by mouth at bedtime. 30 tablet 1   naltrexone (DEPADE) 50 MG tablet Take 1 tablet (50 mg total) by mouth daily. 30 tablet 1   albuterol (VENTOLIN HFA) 108 (90 Base) MCG/ACT inhaler TAKE 2 PUFFS BY MOUTH EVERY 6 HOURS AS NEEDED FOR WHEEZE OR SHORTNESS OF BREATH (Patient  not taking: Reported on 07/19/2023) 6.7 each 0   hydrOXYzine (ATARAX) 10 MG tablet Take 1 tablet (10 mg total) by mouth at bedtime as needed for anxiety. 90 tablet 1   meloxicam (MOBIC) 7.5 MG tablet Take 1 tablet (7.5 mg total) by mouth 2 (two) times daily as needed for pain. (Patient not taking: Reported on 07/19/2023) 30 tablet 2   montelukast (SINGULAIR) 10 MG tablet TAKE 1 TABLET BY MOUTH AT BEDTIME FOR ALLERGIES/ASTHMA 90 tablet 1   olmesartan (BENICAR) 40 MG tablet TAKE 1 TABLET (40 MG TOTAL) BY MOUTH DAILY FOR BLOOD PRESSURE (Patient not taking: Reported on 07/19/2023) 90 tablet 0   predniSONE (DELTASONE) 20 MG tablet Take 3 tablets daily in the morning for 3 days, then 2 tablets for 3 days, then 1 tablet for 3 days. (Patient not taking: Reported on 07/19/2023) 18 tablet 0   venlafaxine XR (EFFEXOR-XR) 37.5 MG 24 hr capsule TAKE 1 CAPSULE (37.5 MG TOTAL) BY MOUTH DAILY WITH BREAKFAST. FOR ANXIETY AND HOT FLASHES (Patient not taking: Reported on 07/19/2023) 90 capsule 0   No current facility-administered medications for this visit.    Medication Side Effects: none  Orders placed this visit:  No orders of the defined types were placed in this encounter.   Psychiatric Specialty Exam:  Review of Systems  Constitutional:  Positive for appetite change, fatigue and unexpected weight change.  Musculoskeletal:  Positive for joint swelling.  Skin:  Positive for rash.  Allergic/Immunologic: Positive for environmental allergies.  Neurological:  Positive for headaches.    Last menstrual period 03/05/2014.There is no height or weight on file to calculate BMI.  General Appearance: Casual, Neat, and Well Groomed  Eye Contact:  Good  Speech:  Clear and Coherent  Volume:  Normal  Mood:  Anxious, Depressed, and Dysphoric  Affect:  Congruent, Depressed, and Anxious  Thought Process:  Coherent  Orientation:  Full (Time, Place, and Person)  Thought Content: Logical   Suicidal Thoughts:  No   Homicidal Thoughts:  No  Memory:  WNL  Judgement:  Good  Insight:  Good  Psychomotor Activity:  Normal  Concentration:  Concentration: Good  Recall:  Good  Fund of Knowledge: Good  Language: Good  Assets:  Desire for Improvement Physical Health Resilience Social Support  ADL's:  Intact  Cognition: WNL  Prognosis:  Good   Screenings:  GAD-7    Flowsheet Row Office Visit from 06/29/2023 in [facility] [place] Office Visit from 11/06/2022 in [facility] Office Visit from 01/02/2020 in [facility] [place] [facility]  Total GAD-7 Score 9 21 19       PHQ2-9    Flowsheet Row Office Visit from 07/19/2023 in [facility] Office Visit from 06/29/2023 in [facility] Visit from 11/06/2022 in [facility] Visit from 04/07/2022 in [facility] Office Visit from 02/04/2021 in [facility] at [place]  PHQ-2 Total Score 2 2 3  0 0  PHQ-9 Total Score 18 9 16 1 6       Flowsheet Row ED from 10/28/2021 in [facility] Emergency Department at [facility] ED from 05/01/2021 in [facility]  C-SSRS RISK CATEGORY No Risk No Risk       Receiving Psychotherapy: No   Treatment Plan/Recommendations:  Greater than 50% of 60 min face to face time with patient was spent on counseling and coordination of care. We discussed her extensive history of substance abuse and her teen years through her early 46s.  Discussed her continued problems with EtOH until recently.  She acknowledges stopping EtOH approximately 2 months ago and declaring no relapse.  We talked about her desire to stop drinking and to control alcohol cravings, as well as treat her depression and anxiety.  We discussed medication options as well as her goals for  care in this office.  She also is currently underweight and would like to increase her appetite and reach a healthy weight.  We talked about her sleep hygiene and sleeping patterns.  She is a little apprehensive about medication so we discussed barriers.  We agreed today to: She will continue hydroxyzine 10 mg 3 times daily as needed for sleep and anxiety. Will start naltrexone 50 mg at bedtime daily for alcohol cravings.  Evidence-based research has shown that this may also help with anxiety. Will start Lexapro 5 mg daily after breakfast.  We will titrate slowly as needed but she is very underweight. To start mirtazapine 15 mg at bedtime to help with sleep quality as well as stimulate appetite with goal of gaining weight. Will report worsening symptoms or side effects promptly To follow-up in 4 weeks to reassess Provided emergency contact information Reviewed PDMP   Joan Flores, NP

## 2023-07-23 ENCOUNTER — Other Ambulatory Visit: Payer: Self-pay | Admitting: Primary Care

## 2023-07-23 DIAGNOSIS — F411 Generalized anxiety disorder: Secondary | ICD-10-CM

## 2023-07-28 NOTE — Telephone Encounter (Signed)
Kelly Walton, we need to update her return to work date for 08/08/2023. I will print a letter stating this information if you can fax it to her short-term disability company.  Letter placed in Kelly Walton's in box.

## 2023-07-29 NOTE — Telephone Encounter (Signed)
Letter faxed to [employer]

## 2023-08-10 ENCOUNTER — Other Ambulatory Visit: Payer: Self-pay | Admitting: Behavioral Health

## 2023-08-10 DIAGNOSIS — F5105 Insomnia due to other mental disorder: Secondary | ICD-10-CM

## 2023-08-10 DIAGNOSIS — R636 Underweight: Secondary | ICD-10-CM

## 2023-08-10 DIAGNOSIS — F331 Major depressive disorder, recurrent, moderate: Secondary | ICD-10-CM

## 2023-08-10 DIAGNOSIS — F411 Generalized anxiety disorder: Secondary | ICD-10-CM

## 2023-08-11 ENCOUNTER — Other Ambulatory Visit: Payer: Self-pay | Admitting: Primary Care

## 2023-08-12 ENCOUNTER — Other Ambulatory Visit: Payer: No Typology Code available for payment source

## 2023-08-12 DIAGNOSIS — F1010 Alcohol abuse, uncomplicated: Secondary | ICD-10-CM | POA: Diagnosis not present

## 2023-08-12 LAB — CBC WITH DIFFERENTIAL/PLATELET
Basophils Absolute: 0.1 10*3/uL (ref 0.0–0.1)
Basophils Relative: 1.2 % (ref 0.0–3.0)
Eosinophils Absolute: 0.1 10*3/uL (ref 0.0–0.7)
Eosinophils Relative: 1.1 % (ref 0.0–5.0)
HCT: 40 % (ref 36.0–46.0)
Hemoglobin: 14 g/dL (ref 12.0–15.0)
Lymphocytes Relative: 23.5 % (ref 12.0–46.0)
Lymphs Abs: 1.1 10*3/uL (ref 0.7–4.0)
MCHC: 35 g/dL (ref 30.0–36.0)
MCV: 100 fL (ref 78.0–100.0)
Monocytes Absolute: 0.2 10*3/uL (ref 0.1–1.0)
Monocytes Relative: 4.4 % (ref 3.0–12.0)
Neutro Abs: 3.2 10*3/uL (ref 1.4–7.7)
Neutrophils Relative %: 69.8 % (ref 43.0–77.0)
Platelets: 274 10*3/uL (ref 150.0–400.0)
RBC: 4 Mil/uL (ref 3.87–5.11)
RDW: 14.9 % (ref 11.5–15.5)
WBC: 4.7 10*3/uL (ref 4.0–10.5)

## 2023-08-12 LAB — HEPATIC FUNCTION PANEL
ALT: 26 U/L (ref 0–35)
AST: 35 U/L (ref 0–37)
Albumin: 3.8 g/dL (ref 3.5–5.2)
Alkaline Phosphatase: 56 U/L (ref 39–117)
Bilirubin, Direct: 0 mg/dL (ref 0.0–0.3)
Total Bilirubin: 0.4 mg/dL (ref 0.2–1.2)
Total Protein: 6 g/dL (ref 6.0–8.3)

## 2023-08-16 ENCOUNTER — Ambulatory Visit: Payer: No Typology Code available for payment source | Admitting: Behavioral Health

## 2023-08-16 LAB — VITAMIN B1: Vitamin B1 (Thiamine): 19 nmol/L (ref 8–30)

## 2023-08-16 NOTE — Progress Notes (Signed)
Pt called in and could not make appt due to work conflict. No additional charges to be assessed.

## 2023-08-19 ENCOUNTER — Ambulatory Visit: Payer: No Typology Code available for payment source | Admitting: Behavioral Health

## 2023-08-19 ENCOUNTER — Encounter: Payer: Self-pay | Admitting: Behavioral Health

## 2023-08-19 DIAGNOSIS — F411 Generalized anxiety disorder: Secondary | ICD-10-CM

## 2023-08-19 DIAGNOSIS — F99 Mental disorder, not otherwise specified: Secondary | ICD-10-CM

## 2023-08-19 DIAGNOSIS — F331 Major depressive disorder, recurrent, moderate: Secondary | ICD-10-CM | POA: Diagnosis not present

## 2023-08-19 DIAGNOSIS — F1011 Alcohol abuse, in remission: Secondary | ICD-10-CM

## 2023-08-19 DIAGNOSIS — R636 Underweight: Secondary | ICD-10-CM

## 2023-08-19 DIAGNOSIS — F5105 Insomnia due to other mental disorder: Secondary | ICD-10-CM

## 2023-08-19 MED ORDER — NALTREXONE HCL 50 MG PO TABS
50.0000 mg | ORAL_TABLET | Freq: Every day | ORAL | 1 refills | Status: DC
Start: 2023-08-19 — End: 2023-09-14

## 2023-08-19 MED ORDER — ESCITALOPRAM OXALATE 5 MG PO TABS
5.0000 mg | ORAL_TABLET | Freq: Every day | ORAL | 1 refills | Status: DC
Start: 2023-08-19 — End: 2024-06-01

## 2023-08-19 MED ORDER — VENLAFAXINE HCL ER 37.5 MG PO CP24
37.5000 mg | ORAL_CAPSULE | Freq: Every day | ORAL | 1 refills | Status: DC
Start: 2023-08-19 — End: 2024-06-01

## 2023-08-19 MED ORDER — HYDROXYZINE HCL 10 MG PO TABS
10.0000 mg | ORAL_TABLET | Freq: Every evening | ORAL | 1 refills | Status: AC | PRN
Start: 2023-08-19 — End: ?

## 2023-08-19 MED ORDER — MIRTAZAPINE 15 MG PO TABS
15.0000 mg | ORAL_TABLET | Freq: Every day | ORAL | 1 refills | Status: DC
Start: 2023-08-19 — End: 2024-06-01

## 2023-08-19 NOTE — Progress Notes (Signed)
[facility] Med Check  Patient ID: Kelly Walton,  MRN: [id number]  PCP: Doreene Nest, NP  Date of Evaluation: 08/19/2023 Time spent:30 minutes  Chief Complaint:  Chief Complaint   Anxiety; Depression; Follow-up; Alcohol Problem; Patient Education; Medication Refill; Weight Loss     HISTORY/CURRENT STATUS: HPI  "44", 52 year old female presents to this office for follow up and medication management.  Collateral information should be considered reliable. Very happy with her medication. Feels like this has been the best regimen so far. She is gaining weight and clothes fitting better. She is requesting no changes this visit.  Naltrexone is helping with the ETOH craving. Only had one Margarita since last visit. Demonstrates readiness to stop drinking altogether.  She says that her depression level right now is 3/10 and anxiety is 3/10.  Appetite has increase and she reports 12 lbs weight gain..  She currently lives in [place] with her significant other of 14 years.  Her mother and one of her grown sons also lives in the home.  She has gone back to work now..  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  She denies any history of mania, no psychosis, no auditory or visual hallucinations.  No current SI or HI.  Patient states that she feels safe and verbally contracts for safety with this Clinical research associate.  Strong family support from her husband and friends.   Past psychiatric medication trials: And laxative seen Ambien Trazodone- hallucinations, listed as allergy Hydroxyzine Individual Medical History/ Review of Systems: Changes? :No   Allergies: Phenergan [promethazine hcl], Sulfa antibiotics, and Trazodone and nefazodone  Current Medications:  Current Outpatient Medications:    albuterol (VENTOLIN HFA) 108 (90 Base) MCG/ACT inhaler, TAKE 2 PUFFS BY MOUTH EVERY 6 HOURS AS NEEDED FOR WHEEZE OR SHORTNESS OF BREATH (Patient not taking: Reported on  07/19/2023), Disp: 6.7 each, Rfl: 0   amLODipine (NORVASC) 5 MG tablet, TAKE 1 TABLET BY MOUTH EVERY DAY FOR BLOOD PRESSURE, Disp: 90 tablet, Rfl: 2   Budesonide (PULMICORT FLEXHALER) 90 MCG/ACT inhaler, INHALE 1-2 PUFFS INTO THE LUNGS TWICE A DAY, Disp: 1 each, Rfl: 2   escitalopram (LEXAPRO) 5 MG tablet, Take 1 tablet (5 mg total) by mouth daily., Disp: 90 tablet, Rfl: 1   fluticasone (FLONASE) 50 MCG/ACT nasal spray, PLACE 1 SPRAY INTO BOTH NOSTRILS 2 (TWO) TIMES DAILY, Disp: 48 mL, Rfl: 1   hydrOXYzine (ATARAX) 10 MG tablet, Take 1 tablet (10 mg total) by mouth at bedtime as needed for anxiety., Disp: 90 tablet, Rfl: 1   meloxicam (MOBIC) 7.5 MG tablet, Take 1 tablet (7.5 mg total) by mouth 2 (two) times daily as needed for pain. (Patient not taking: Reported on 07/19/2023), Disp: 30 tablet, Rfl: 2   mirtazapine (REMERON) 15 MG tablet, Take 1 tablet (15 mg total) by mouth at bedtime., Disp: 90 tablet, Rfl: 1   montelukast (SINGULAIR) 10 MG tablet, TAKE 1 TABLET BY MOUTH AT BEDTIME FOR ALLERGIES/ASTHMA, Disp: 90 tablet, Rfl: 1   naltrexone (DEPADE) 50 MG tablet, Take 1 tablet (50 mg total) by mouth daily., Disp: 90 tablet, Rfl: 1   olmesartan (BENICAR) 40 MG tablet, TAKE 1 TABLET (40 MG TOTAL) BY MOUTH DAILY FOR BLOOD PRESSURE (Patient not taking: Reported on 07/19/2023), Disp: 90 tablet, Rfl: 0   predniSONE (DELTASONE) 20 MG tablet, Take 3 tablets daily in the morning for 3 days, then 2 tablets for 3 days, then 1 tablet for 3 days. (Patient not taking:  Reported on 07/19/2023), Disp: 18 tablet, Rfl: 0   venlafaxine XR (EFFEXOR-XR) 37.5 MG 24 hr capsule, Take 1 capsule (37.5 mg total) by mouth daily with breakfast. For anxiety and hot flashes, Disp: 90 capsule, Rfl: 1 Medication Side Effects: none  Family Medical/ Social History: Changes? No  MENTAL HEALTH EXAM:  Last menstrual period 03/05/2014.There is no height or weight on file to calculate BMI.  General Appearance: Casual, Neat, and Well  Groomed  Eye Contact:  Good  Speech:  Clear and Coherent  Volume:  Normal  Mood:  NA  Affect:  Appropriate  Thought Process:  Coherent  Orientation:  Full (Time, Place, and Person)  Thought Content: Logical   Suicidal Thoughts:  No  Homicidal Thoughts:  No  Memory:  WNL  Judgement:  Good  Insight:  Good  Psychomotor Activity:  NA  Concentration:  Concentration: Good  Recall:  Good  Fund of Knowledge: Good  Language: Good  Assets:  Desire for Improvement  ADL's:  Intact  Cognition: WNL  Prognosis:  Good    DIAGNOSES:    ICD-10-CM   1. GAD (generalized anxiety disorder)  F41.1 hydrOXYzine (ATARAX) 10 MG tablet    venlafaxine XR (EFFEXOR-XR) 37.5 MG 24 hr capsule    2. Generalized anxiety disorder  F41.1 escitalopram (LEXAPRO) 5 MG tablet    naltrexone (DEPADE) 50 MG tablet    3. Major depressive disorder, recurrent episode, moderate (HCC)  F33.1 escitalopram (LEXAPRO) 5 MG tablet    4. Underweight  R63.6 mirtazapine (REMERON) 15 MG tablet    5. Insomnia due to other mental disorder  F51.05 mirtazapine (REMERON) 15 MG tablet   F99     6. History of alcohol abuse  F10.11 naltrexone (DEPADE) 50 MG tablet      Receiving Psychotherapy: No    RECOMMENDATIONS:  Greater than 50% of 60 min face to face time with patient was spent on counseling and coordination of care. She is very happy with her medication. Reports gaining 12 lbs. Naltrexone has reduced her ETOH craving. She tried to drink a [place] and could not get through the whole drink.  Anxiety and depression is manageable level right now.  She is requesting no medication changes this visit.    We agreed today to: She will continue hydroxyzine 10 mg 3 times daily as needed for sleep and anxiety. Continue  naltrexone 50 mg at bedtime daily for alcohol cravings.  Evidence-based research has shown that this may also help with anxiety. To continue Lexapro 5 mg daily after breakfast. We will titrate slowly as needed but  she is very underweight. Continue  mirtazapine 15 mg at bedtime to help with sleep quality as well as stimulate appetite with goal of gaining weight. Will report worsening symptoms or side effects promptly To follow-up in 8 weeks to reassess Provided emergency contact information Reviewed PDMP    Joan Flores, NP

## 2023-08-30 ENCOUNTER — Encounter: Payer: No Typology Code available for payment source | Attending: Primary Care | Admitting: Dietician

## 2023-08-30 VITALS — Wt 90.0 lb

## 2023-08-30 DIAGNOSIS — E440 Moderate protein-calorie malnutrition: Secondary | ICD-10-CM

## 2023-08-30 DIAGNOSIS — R636 Underweight: Secondary | ICD-10-CM

## 2023-08-30 NOTE — Progress Notes (Signed)
Medical Nutrition Therapy  Appointment Start time:  [phone] Appointment End time:  0919  Primary concerns today: Pt states she cannot gain body weight as desired  Referral diagnosis: Underweight  Preferred learning style:   no preference indicated Learning readiness: ready-change in progress   NUTRITION ASSESSMENT    Clinical Medical Hx:  Past Medical History:  Diagnosis Date   AC separation, right, initial encounter    Acute conjunctivitis of left eye 12/27/2020   Acute right lower quadrant pain 06/10/2018   Alcohol abuse    Anxiety    Arthritis    Asthma    Hand injury, right, initial encounter 09/26/2019   History of fracture of clavicle 06/11/2020   Hypertension    Insomnia    Pain of upper abdomen 06/21/2017   Palpitations    a. 05/2019 Echo: EF 60-65%, diast dysfxn; b. 05/2019 Zio: Sinus rhythm-sinus tach. Avg HR 105. 6 SVT episodes - longest 7 beats. Rare PVCs-->Diltiazem added.   Periumbilical abdominal pain 03/12/2020   Spongiotic dermatitis     Medications:  Current Outpatient Medications:    amLODipine (NORVASC) 5 MG tablet, TAKE 1 TABLET BY MOUTH EVERY DAY FOR BLOOD PRESSURE, Disp: 90 tablet, Rfl: 2   escitalopram (LEXAPRO) 5 MG tablet, Take 1 tablet (5 mg total) by mouth daily., Disp: 90 tablet, Rfl: 1   fluticasone (FLONASE) 50 MCG/ACT nasal spray, PLACE 1 SPRAY INTO BOTH NOSTRILS 2 (TWO) TIMES DAILY, Disp: 48 mL, Rfl: 1   hydrOXYzine (ATARAX) 10 MG tablet, Take 1 tablet (10 mg total) by mouth at bedtime as needed for anxiety., Disp: 90 tablet, Rfl: 1   mirtazapine (REMERON) 15 MG tablet, Take 1 tablet (15 mg total) by mouth at bedtime., Disp: 90 tablet, Rfl: 1   montelukast (SINGULAIR) 10 MG tablet, TAKE 1 TABLET BY MOUTH AT BEDTIME FOR ALLERGIES/ASTHMA, Disp: 90 tablet, Rfl: 1   naltrexone (DEPADE) 50 MG tablet, Take 1 tablet (50 mg total) by mouth daily., Disp: 90 tablet, Rfl: 1   venlafaxine XR (EFFEXOR-XR) 37.5 MG 24 hr capsule, Take 1 capsule (37.5 mg total)  by mouth daily with breakfast. For anxiety and hot flashes, Disp: 90 capsule, Rfl: 1   albuterol (VENTOLIN HFA) 108 (90 Base) MCG/ACT inhaler, TAKE 2 PUFFS BY MOUTH EVERY 6 HOURS AS NEEDED FOR WHEEZE OR SHORTNESS OF BREATH (Patient not taking: Reported on 08/30/2023), Disp: 6.7 each, Rfl: 0   Budesonide (PULMICORT FLEXHALER) 90 MCG/ACT inhaler, INHALE 1-2 PUFFS INTO THE LUNGS TWICE A DAY (Patient not taking: Reported on 08/30/2023), Disp: 1 each, Rfl: 2   meloxicam (MOBIC) 7.5 MG tablet, Take 1 tablet (7.5 mg total) by mouth 2 (two) times daily as needed for pain. (Patient not taking: Reported on 08/30/2023), Disp: 30 tablet, Rfl: 2   olmesartan (BENICAR) 40 MG tablet, TAKE 1 TABLET (40 MG TOTAL) BY MOUTH DAILY FOR BLOOD PRESSURE (Patient not taking: Reported on 08/30/2023), Disp: 90 tablet, Rfl: 0   predniSONE (DELTASONE) 20 MG tablet, Take 3 tablets daily in the morning for 3 days, then 2 tablets for 3 days, then 1 tablet for 3 days. (Patient not taking: Reported on 08/30/2023), Disp: 18 tablet, Rfl: 0  Labs:  Lab Results  Component Value Date   HGBA1C 5.3 04/07/2022   Lab Results  Component Value Date   CHOL 270 (H) 04/07/2022   HDL 140.80 04/07/2022   LDLCALC 90 04/07/2022   LDLDIRECT 168.0 02/04/2021   TRIG 194.0 (H) 04/07/2022   CHOLHDL 2 04/07/2022   Lab  Results  Component Value Date   NA 144 06/29/2023   CL 104 06/29/2023   K 3.9 06/29/2023   CO2 31 06/29/2023   BUN 14 06/29/2023   CREATININE 0.85 06/29/2023   GFR 79.38 06/29/2023   CALCIUM 8.5 06/29/2023   ALBUMIN 3.8 08/12/2023   GLUCOSE 91 06/29/2023    Wt Readings from Last 3 Encounters:  08/30/23 90 lb (40.8 kg)  06/29/23 86 lb (39 kg)  02/25/23 92 lb (41.7 kg)  Pt's body weight increased 4# and is desired and intentional   Lifestyle & Dietary Hx Pt presents today alone. Pt reports she lives with her partner and mother. Pt reports she does all the shopping and shares cooking with husband.  Pt reports she  monitors her body weight on home scale. Pt reports gaining weight has been challenging. Pt reports using boost or ensure once daily to increase her body weight for the past year. Pt reports she is not a breakfast person and is using boost a a meal replacement. Pt reports she enjoys and crafts with her grandchildren. Pt reports she works full time mostly on her feet 6 days weekly. Pt reports she recently discontinued alcohol about 5 months ago and is connected to therapy. Pt reports her appetite is poor. Pt reports her barriers to increasing body weight are lack of time with her work schedule and poor appetite. Pt dislikes asparagus. All Pt questions were answered during this encounter.   Estimated daily fluid intake: unknown oz Supplements: none Sleep: poor; Pt c/o insomnia Stress / self-care: 8 out of 10/ "nothing" Current average weekly physical activity:   24-Hr Dietary Recall First Meal: strawberry or peaches and cream Ensure   Snack:  none Second Meal: ~ 1:30 pm leftovers or sandwich ham and cheese on honey wheat with or fast food cookout cheeseburger plain, coke  Snack: none Third Meal: ~6 pm: noodles, [place] or beef red sauce, parmesan cheese garlic bread, sometimes salad or meat loaf, mashed potatoes, green beans, rolls or celery, potatoes, carrots, onions, roast beef, coke or sweet tea with lemon Snack: none Beverages: water, crystal light   NUTRITION DIAGNOSIS  NB-1.1 Food and nutrition-related knowledge deficit As related to no prior nutrition related education.  As evidenced by Pt report and dietary recall.   NUTRITION INTERVENTION  Nutrition education (E-1) on the following topics:  Fruits & Vegetables: Aim to fill half your plate with a variety of fruits and vegetables. They are rich in vitamins, minerals, and fiber, and can help reduce the risk of chronic diseases. Choose a colorful assortment of fruits and vegetables to ensure you get a wide range of nutrients. Grains and  Starches: Make at least half of your grain choices whole grains, such as brown rice, whole wheat bread, and oats. Whole grains provide fiber, which aids in digestion and healthy cholesterol levels. Aim for whole forms of starchy vegetables such as potatoes, sweet potatoes, beans, peas, and corn, which are fiber rich and provide many vitamins and minerals.  Protein: Incorporate lean sources of protein, such as poultry, fish, beans, nuts, and seeds, into your meals. Protein is essential for building and repairing tissues, staying full, balancing blood sugar, as well as supporting immune function. Include protein at all meals and snacks Dairy: Include low-fat or fat-free dairy products like milk, yogurt, and cheese in your diet. Dairy foods are excellent sources of calcium and vitamin D, which are crucial for bone health.  Physical Activity: Aim for 30 minutes of physical activity  daily. Regular physical activity promotes overall health-including helping to reduce risk for heart disease and diabetes, promoting mental health, and helping [place] sleep better.   Increase intake of meals and snacks aiming for balanced intake every 3 hours aiming for 3 meals with 2-03 snack daily.   Handouts Provided Include  Plate Planner Snack Options High Calories, High Protein Recipes  Learning Style & Readiness for Change Teaching method utilized: Visual & Auditory  Demonstrated degree of understanding via: Teach Back  Barriers to learning/adherence to lifestyle change: work schedule and appetite  Goals Established by Pt Try to increase intake of calories by adding 1 snack daily including protein and starch   MONITORING & EVALUATION Dietary intake, weekly physical activity, and body weight  Next Steps  Patient is to return 4-6 weeks PRN.

## 2023-08-30 NOTE — Patient Instructions (Addendum)
1- Try to increase intake of calories by adding 1 snack daily including protein and starch

## 2023-09-07 ENCOUNTER — Encounter: Payer: Self-pay | Admitting: Physician Assistant

## 2023-09-07 ENCOUNTER — Telehealth: Payer: Self-pay

## 2023-09-07 ENCOUNTER — Ambulatory Visit: Payer: No Typology Code available for payment source | Admitting: Physician Assistant

## 2023-09-07 ENCOUNTER — Other Ambulatory Visit: Payer: Self-pay

## 2023-09-07 DIAGNOSIS — M79641 Pain in right hand: Secondary | ICD-10-CM | POA: Diagnosis not present

## 2023-09-07 DIAGNOSIS — M25571 Pain in right ankle and joints of right foot: Secondary | ICD-10-CM

## 2023-09-07 NOTE — Progress Notes (Signed)
 Office Visit Note   Patient: Kelly Walton           Date of Birth: [birth year]           MRN: [id number] Visit Date: 09/07/2023              Requested by: Gretta Comer POUR, NP [address] [place],  [place] [address] PCP: Gretta Comer POUR, NP   Assessment & Plan: Visit Diagnoses:  1. Pain in right ankle and joints of right foot   2. 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  She was seen and placed in a splint.  She has had previous surgery of the second medical carpal.  She is otherwise fairly healthy.  She comes in today complaining of pain and swelling at the second MCP joint.  X-rays today demonstrate a depressed intra-articular fracture of the head of the second metacarpal.  Discussed this with Dr. Barbarann he recommend timely referral to Dr. Erwin for possible surgical intervention.  She does use her hand quite a bit and her job and she is right-hand dominant  Follow-Up Instructions: With Dr. Erwin next week  Orders:  Orders Placed This Encounter  Procedures   XR Hand Complete Right   No orders of the defined types were placed in this encounter.     Procedures: No procedures performed   Clinical Data: No additional findings.   Subjective:   Injury    Review of Systems  All other systems reviewed and are negative.    Objective: Vital Signs: LMP 03/05/2014 Comment: LMP x 38yrs ago   Physical Exam Constitutional:      Appearance: Normal appearance.  Pulmonary:     Effort: Pulmonary effort is normal.  Skin:    General: Skin is warm and dry.  Neurological:     General: No focal deficit present.     Mental Status: She is alert.     Ortho Exam Right hand she is neurovascularly intact she does have swelling and ecchymosis at the second MCP joint.  Very tender to palpation she has brisk capillary refill.   Pulses are intact.  No evidence of cellulitis or infection she has resolving abrasions Specialty Comments:  No specialty comments available.  Imaging: No results found.   PMFS History: Patient Active Problem List   Diagnosis Date Noted   Pain in right hand 09/07/2023   Rash and other nonspecific skin eruption 06/29/2023   Unexplained weight loss 02/25/2023   Vitamin D  deficiency 11/06/2022   Elevated lipase 11/06/2022   Left upper quadrant abdominal pain 08/04/2022   Mass of soft tissue of abdomen 08/04/2022   GERD (gastroesophageal reflux disease) 04/07/2022   Frequent headaches 10/02/2021   Night sweats 10/02/2021   Dizziness 04/18/2021   Tachycardia 04/18/2021   Dyspareunia, female 04/24/2020   Diarrhea 03/12/2020   Toe pain, chronic, left 02/28/2020   GAD (generalized anxiety disorder) 01/02/2020   Alcohol abuse 01/02/2020   Palpitations 04/03/2019   Seasonal allergies 01/16/2019   Elevated LFTs 11/15/2018   Spongiotic dermatitis 07/08/2018   Hyperlipidemia 07/08/2018   Asthma 08/20/2017   Preventative health care 06/21/2017   Insomnia 04/16/2017   Essential hypertension 04/16/2017   Past Medical History:  Diagnosis Date   AC separation, right, initial encounter    Acute conjunctivitis of left eye  12/27/2020   Acute right lower quadrant pain 06/10/2018   Alcohol abuse    Anxiety    Arthritis    Asthma    Hand injury, right, initial encounter 09/26/2019   History of fracture of clavicle 06/11/2020   Hypertension    Insomnia    Pain of upper abdomen 06/21/2017   Palpitations    a. 05/2019 Echo: EF 60-65%, diast dysfxn; b. 05/2019 Zio: Sinus rhythm-sinus tach. Avg HR 105. 6 SVT episodes - longest 7 beats. Rare PVCs-->Diltiazem  added.   Periumbilical abdominal pain 03/12/2020   Spongiotic dermatitis     Family History  Problem Relation Age of Onset   Arthritis Mother    Kidney disease Mother    Alcohol abuse Father    Arthritis Father    Lung cancer Father     Heart attack Father    Lymphoma Father    Diabetes Father    Epilepsy Brother    Breast cancer Maternal Aunt    Hyperlipidemia Maternal Grandmother    Stroke Maternal Grandmother    Hypertension Maternal Grandmother    Heart attack Maternal Grandmother    Diabetes Maternal Grandmother    Alcohol abuse Paternal Grandmother    Arthritis Paternal Grandmother    Mental illness Paternal Grandmother    Lupus Son    Colon cancer Neg Hx    Esophageal cancer Neg Hx    Stomach cancer Neg Hx    Rectal cancer Neg Hx     Past Surgical History:  Procedure Laterality Date   CESAREAN SECTION     FOOT FRACTURE SURGERY Right    ORIF CLAVICULAR FRACTURE Right 06/17/2020   Procedure: OPEN REDUCTION INTERNAL FIXATION (ORIF) RIGHT CLAVICLE FRACTURE, ACROMIOCLAVICULAR SEPARATION;  Surgeon: Jerri Kay HERO, MD;  Location: [facility];  Service: Orthopedics;  Laterality: Right;   ORIF CLAVICULAR FRACTURE Right 07/05/2020   Procedure: REVISION OPEN REDUCTION INTERNAL FIXATION (ORIF) RIGHT CLAVICLE FRACTURE, ACROMIOCLAVICULAR SEPARATION;  Surgeon: Jerri Kay HERO, MD;  Location: [facility];  Service: Orthopedics;  Laterality: Right;   PERCUTANEOUS PINNING PHALANX FRACTURE OF HAND Right    ROBOTIC ASSISTED TOTAL HYSTERECTOMY N/A 03/05/2014   Procedure: ROBOTIC ASSISTED TOTAL HYSTERECTOMY/BILATERAL SALPINGECTOMY;  Surgeon: Dickie DELENA Carder, MD;  Location: [facility];  Service: Gynecology;  Laterality: N/A;   TUBAL LIGATION     Social History   Occupational History   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 Pharm Tech  Tobacco Use   Smoking status: Never   Smokeless tobacco: Never  Vaping Use   Vaping status: Never Used  Substance and Sexual Activity   Alcohol use: Yes    Comment: no ETOH in 2 months   Drug use: No   Sexual activity: Yes

## 2023-09-07 NOTE — Telephone Encounter (Signed)
Kelly Walton states that per Ophelia Charter this patient needs to see Fara Boros next week for potential hand surgical issue Can you please call patient, thanks

## 2023-09-07 NOTE — Telephone Encounter (Signed)
Spoke with patient; she is coming in at 38 on Tuesday 7th

## 2023-09-14 ENCOUNTER — Ambulatory Visit: Payer: No Typology Code available for payment source | Admitting: Orthopedic Surgery

## 2023-09-14 ENCOUNTER — Encounter: Payer: Self-pay | Admitting: Professional Counselor

## 2023-09-14 ENCOUNTER — Encounter: Payer: Self-pay | Admitting: Orthopedic Surgery

## 2023-09-14 ENCOUNTER — Ambulatory Visit (INDEPENDENT_AMBULATORY_CARE_PROVIDER_SITE_OTHER): Payer: No Typology Code available for payment source

## 2023-09-14 ENCOUNTER — Ambulatory Visit (INDEPENDENT_AMBULATORY_CARE_PROVIDER_SITE_OTHER): Payer: No Typology Code available for payment source | Admitting: Professional Counselor

## 2023-09-14 ENCOUNTER — Other Ambulatory Visit: Payer: Self-pay | Admitting: Orthopedic Surgery

## 2023-09-14 DIAGNOSIS — S62390A Other fracture of second metacarpal bone, right hand, initial encounter for closed fracture: Secondary | ICD-10-CM | POA: Diagnosis not present

## 2023-09-14 DIAGNOSIS — F331 Major depressive disorder, recurrent, moderate: Secondary | ICD-10-CM

## 2023-09-14 DIAGNOSIS — M79641 Pain in right hand: Secondary | ICD-10-CM | POA: Diagnosis not present

## 2023-09-14 DIAGNOSIS — F411 Generalized anxiety disorder: Secondary | ICD-10-CM | POA: Diagnosis not present

## 2023-09-14 MED ORDER — OXYCODONE HCL 5 MG PO TABS: 5.0000 mg | ORAL_TABLET | Freq: Four times a day (QID) | ORAL | 0 refills | Status: DC | PRN

## 2023-09-14 NOTE — H&P (View-Only) (Signed)
 [name] - 53 y.o. female MRN [id number]  Date of birth: January 05, 1971  Office Visit Note: Visit Date: 09/14/2023 PCP: Gretta Comer POUR, NP Referred by: Gretta Comer POUR, NP  Subjective: No chief complaint on file.  HPI: Kelly Walton is a pleasant 53 y.o. female who presents today for evaluation of a right hand injury sustained approximately 3 weeks prior.  Injury mechanism described as having her hand slammed into a car door with significant pain at the index metacarpal head.  She was seen initially in the urgent care setting and given a subsequent referral to orthopedics.  She has since been referred to orthopedic hand surgery for a right index finger metacarpal head fracture with articular involvement with possible need for surgical intervention.  Pertinent ROS were reviewed with the patient and found to be negative unless otherwise specified above in HPI.   Visit Reason: right hand injury Duration of symptoms: 3 weeks Hand dominance: right Occupation: Pharmacy Tech Diabetic: No Smoking: No Heart/Lung History: none Blood Thinners:  none  Treatments: brace Prior Surgery: 3 prior surgeries on hand, prior index finger metacarpal shaft ORIF  Assessment & Plan: Visit Diagnoses:  1. Pain in right hand     Plan: Extensive discussion was had with the patient today regarding her right index finger metacarpal head fracture.  New x-rays obtained today were reviewed in detail with the patient as well which do show metacarpal head fracture with notable displacement and intra-articular involvement.  Given her ongoing pain and swelling in this region with limited motion and the associated radiographic workup, she is indicated for right index finger metacarpal head fracture closed reduction and percutaneous pinning versus possible open reduction internal fixation.  Given the timeline of her injury, I did discuss the likelihood for needing open reduction internal fixation of this  injury.  Risks and benefits of the procedure were discussed, risks including but not limited to infection, bleeding, scarring, stiffness, nerve injury, tendon injury, vascular injury, hardware complication, recurrence of symptoms and need for subsequent operation.  Patient expressed understanding.  We will move forward with surgical scheduling for the next available date, surgery will be performed this week.    Follow-up: No follow-ups on file.   Meds & Orders: No orders of the defined types were placed in this encounter.   Orders Placed This Encounter  Procedures   XR Hand Complete Right     Procedures: No procedures performed      Clinical History: No specialty comments available.  She reports that she has never smoked. She has never used smokeless tobacco. No results for input(s): HGBA1C, LABURIC in the last 8760 hours.  Objective:   Vital Signs: LMP 03/05/2014 Comment: LMP x 12yrs ago   Physical Exam  Gen: Well-appearing, in no acute distress; non-toxic CV: Regular Rate. Well-perfused. Warm.  Resp: Breathing unlabored on room air; no wheezing. Psych: Fluid speech in conversation; appropriate affect; normal thought process  Ortho Exam Right hand: - Significant swelling notable over the index finger metacarpal head with associated tenderness - Range of motion is limited at the index finger MCP, 0-30, pain elicited with flexion past the 30 degree mark of the index finger - Remaining digits with full range of motion without restriction - No significant rotational abnormalities appreciated, normal cascade of the digits - Sensation intact in all distributions, hand is warm well-perfused  Imaging: XR Hand Complete Right Result Date: 09/14/2023 X-rays of the right hand demonstrate an index finger metacarpal head fracture  with notable displacement and shortening, associated comminution, there is intra-articular involvement at the MCP seen in multiple planes   Past  Medical/Family/Surgical/Social History: Medications & Allergies reviewed per EMR, new medications updated. Patient Active Problem List   Diagnosis Date Noted   Pain in right hand 09/07/2023   Rash and other nonspecific skin eruption 06/29/2023   Unexplained weight loss 02/25/2023   Vitamin D  deficiency 11/06/2022   Elevated lipase 11/06/2022   Left upper quadrant abdominal pain 08/04/2022   Mass of soft tissue of abdomen 08/04/2022   GERD (gastroesophageal reflux disease) 04/07/2022   Frequent headaches 10/02/2021   Night sweats 10/02/2021   Dizziness 04/18/2021   Tachycardia 04/18/2021   Dyspareunia, female 04/24/2020   Diarrhea 03/12/2020   Toe pain, chronic, left 02/28/2020   GAD (generalized anxiety disorder) 01/02/2020   Alcohol abuse 01/02/2020   Palpitations 04/03/2019   Seasonal allergies 01/16/2019   Elevated LFTs 11/15/2018   Spongiotic dermatitis 07/08/2018   Hyperlipidemia 07/08/2018   Asthma 08/20/2017   Preventative health care 06/21/2017   Insomnia 04/16/2017   Essential hypertension 04/16/2017   Past Medical History:  Diagnosis Date   AC separation, right, initial encounter    Acute conjunctivitis of left eye 12/27/2020   Acute right lower quadrant pain 06/10/2018   Alcohol abuse    Anxiety    Arthritis    Asthma    Hand injury, right, initial encounter 09/26/2019   History of fracture of clavicle 06/11/2020   Hypertension    Insomnia    Pain of upper abdomen 06/21/2017   Palpitations    a. 05/2019 Echo: EF 60-65%, diast dysfxn; b. 05/2019 Zio: Sinus rhythm-sinus tach. Avg HR 105. 6 SVT episodes - longest 7 beats. Rare PVCs-->Diltiazem  added.   Periumbilical abdominal pain 03/12/2020   Spongiotic dermatitis    Family History  Problem Relation Age of Onset   Arthritis Mother    Kidney disease Mother    Alcohol abuse Father    Arthritis Father    Lung cancer Father    Heart attack Father    Lymphoma Father    Diabetes Father    Epilepsy Brother     Breast cancer Maternal Aunt    Hyperlipidemia Maternal Grandmother    Stroke Maternal Grandmother    Hypertension Maternal Grandmother    Heart attack Maternal Grandmother    Diabetes Maternal Grandmother    Alcohol abuse Paternal Grandmother    Arthritis Paternal Grandmother    Mental illness Paternal Grandmother    Lupus Son    Colon cancer Neg Hx    Esophageal cancer Neg Hx    Stomach cancer Neg Hx    Rectal cancer Neg Hx    Past Surgical History:  Procedure Laterality Date   CESAREAN SECTION     FOOT FRACTURE SURGERY Right    ORIF CLAVICULAR FRACTURE Right 06/17/2020   Procedure: OPEN REDUCTION INTERNAL FIXATION (ORIF) RIGHT CLAVICLE FRACTURE, ACROMIOCLAVICULAR SEPARATION;  Surgeon: Jerri Kay HERO, MD;  Location: [facility];  Service: Orthopedics;  Laterality: Right;   ORIF CLAVICULAR FRACTURE Right 07/05/2020   Procedure: REVISION OPEN REDUCTION INTERNAL FIXATION (ORIF) RIGHT CLAVICLE FRACTURE, ACROMIOCLAVICULAR SEPARATION;  Surgeon: Jerri Kay HERO, MD;  Location: [facility];  Service: Orthopedics;  Laterality: Right;   PERCUTANEOUS PINNING PHALANX FRACTURE OF HAND Right    ROBOTIC ASSISTED TOTAL HYSTERECTOMY N/A 03/05/2014   Procedure: ROBOTIC ASSISTED TOTAL HYSTERECTOMY/BILATERAL SALPINGECTOMY;  Surgeon: Dickie DELENA Carder, MD;  Location: [facility];  Service: Gynecology;  Laterality: N/A;  TUBAL LIGATION     Social History   Occupational History   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 Pharm Tech  Tobacco Use   Smoking status: Never   Smokeless tobacco: Never  Vaping Use   Vaping status: Never Used  Substance and Sexual Activity   Alcohol use: Yes    Comment: no ETOH in 2 months   Drug use: No   Sexual activity: Yes    Adelyn Roscher Estela) Kelly Walton, M.D. [facility] 9:18 AM

## 2023-09-14 NOTE — Progress Notes (Signed)
 [name] - 53 y.o. female MRN [id number]  Date of birth: January 05, 1971  Office Visit Note: Visit Date: 09/14/2023 PCP: Gretta Comer POUR, NP Referred by: Gretta Comer POUR, NP  Subjective: No chief complaint on file.  HPI: [name] is a pleasant 53 y.o. female who presents today for evaluation of a right hand injury sustained approximately 3 weeks prior.  Injury mechanism described as having her hand slammed into a car door with significant pain at the index metacarpal head.  She was seen initially in the urgent care setting and given a subsequent referral to orthopedics.  She has since been referred to orthopedic hand surgery for a right index finger metacarpal head fracture with articular involvement with possible need for surgical intervention.  Pertinent ROS were reviewed with the patient and found to be negative unless otherwise specified above in HPI.   Visit Reason: right hand injury Duration of symptoms: 3 weeks Hand dominance: right Occupation: Pharmacy Tech Diabetic: No Smoking: No Heart/Lung History: none Blood Thinners:  none  Treatments: brace Prior Surgery: 3 prior surgeries on hand, prior index finger metacarpal shaft ORIF  Assessment & Plan: Visit Diagnoses:  1. Pain in right hand     Plan: Extensive discussion was had with the patient today regarding her right index finger metacarpal head fracture.  New x-rays obtained today were reviewed in detail with the patient as well which do show metacarpal head fracture with notable displacement and intra-articular involvement.  Given her ongoing pain and swelling in this region with limited motion and the associated radiographic workup, she is indicated for right index finger metacarpal head fracture closed reduction and percutaneous pinning versus possible open reduction internal fixation.  Given the timeline of her injury, I did discuss the likelihood for needing open reduction internal fixation of this  injury.  Risks and benefits of the procedure were discussed, risks including but not limited to infection, bleeding, scarring, stiffness, nerve injury, tendon injury, vascular injury, hardware complication, recurrence of symptoms and need for subsequent operation.  Patient expressed understanding.  We will move forward with surgical scheduling for the next available date, surgery will be performed this week.    Follow-up: No follow-ups on file.   Meds & Orders: No orders of the defined types were placed in this encounter.   Orders Placed This Encounter  Procedures   XR Hand Complete Right     Procedures: No procedures performed      Clinical History: No specialty comments available.  She reports that she has never smoked. She has never used smokeless tobacco. No results for input(s): HGBA1C, LABURIC in the last 8760 hours.  Objective:   Vital Signs: LMP 03/05/2014 Comment: LMP x 12yrs ago   Physical Exam  Gen: Well-appearing, in no acute distress; non-toxic CV: Regular Rate. Well-perfused. Warm.  Resp: Breathing unlabored on room air; no wheezing. Psych: Fluid speech in conversation; appropriate affect; normal thought process  Ortho Exam Right hand: - Significant swelling notable over the index finger metacarpal head with associated tenderness - Range of motion is limited at the index finger MCP, 0-30, pain elicited with flexion past the 30 degree mark of the index finger - Remaining digits with full range of motion without restriction - No significant rotational abnormalities appreciated, normal cascade of the digits - Sensation intact in all distributions, hand is warm well-perfused  Imaging: XR Hand Complete Right Result Date: 09/14/2023 X-rays of the right hand demonstrate an index finger metacarpal head fracture  with notable displacement and shortening, associated comminution, there is intra-articular involvement at the MCP seen in multiple planes   Past  Medical/Family/Surgical/Social History: Medications & Allergies reviewed per EMR, new medications updated. Patient Active Problem List   Diagnosis Date Noted   Pain in right hand 09/07/2023   Rash and other nonspecific skin eruption 06/29/2023   Unexplained weight loss 02/25/2023   Vitamin D  deficiency 11/06/2022   Elevated lipase 11/06/2022   Left upper quadrant abdominal pain 08/04/2022   Mass of soft tissue of abdomen 08/04/2022   GERD (gastroesophageal reflux disease) 04/07/2022   Frequent headaches 10/02/2021   Night sweats 10/02/2021   Dizziness 04/18/2021   Tachycardia 04/18/2021   Dyspareunia, female 04/24/2020   Diarrhea 03/12/2020   Toe pain, chronic, left 02/28/2020   GAD (generalized anxiety disorder) 01/02/2020   Alcohol abuse 01/02/2020   Palpitations 04/03/2019   Seasonal allergies 01/16/2019   Elevated LFTs 11/15/2018   Spongiotic dermatitis 07/08/2018   Hyperlipidemia 07/08/2018   Asthma 08/20/2017   Preventative health care 06/21/2017   Insomnia 04/16/2017   Essential hypertension 04/16/2017   Past Medical History:  Diagnosis Date   AC separation, right, initial encounter    Acute conjunctivitis of left eye 12/27/2020   Acute right lower quadrant pain 06/10/2018   Alcohol abuse    Anxiety    Arthritis    Asthma    Hand injury, right, initial encounter 09/26/2019   History of fracture of clavicle 06/11/2020   Hypertension    Insomnia    Pain of upper abdomen 06/21/2017   Palpitations    a. 05/2019 Echo: EF 60-65%, diast dysfxn; b. 05/2019 Zio: Sinus rhythm-sinus tach. Avg HR 105. 6 SVT episodes - longest 7 beats. Rare PVCs-->Diltiazem  added.   Periumbilical abdominal pain 03/12/2020   Spongiotic dermatitis    Family History  Problem Relation Age of Onset   Arthritis Mother    Kidney disease Mother    Alcohol abuse Father    Arthritis Father    Lung cancer Father    Heart attack Father    Lymphoma Father    Diabetes Father    Epilepsy Brother     Breast cancer Maternal Aunt    Hyperlipidemia Maternal Grandmother    Stroke Maternal Grandmother    Hypertension Maternal Grandmother    Heart attack Maternal Grandmother    Diabetes Maternal Grandmother    Alcohol abuse Paternal Grandmother    Arthritis Paternal Grandmother    Mental illness Paternal Grandmother    Lupus Son    Colon cancer Neg Hx    Esophageal cancer Neg Hx    Stomach cancer Neg Hx    Rectal cancer Neg Hx    Past Surgical History:  Procedure Laterality Date   CESAREAN SECTION     FOOT FRACTURE SURGERY Right    ORIF CLAVICULAR FRACTURE Right 06/17/2020   Procedure: OPEN REDUCTION INTERNAL FIXATION (ORIF) RIGHT CLAVICLE FRACTURE, ACROMIOCLAVICULAR SEPARATION;  Surgeon: Jerri Kay HERO, MD;  Location: [facility];  Service: Orthopedics;  Laterality: Right;   ORIF CLAVICULAR FRACTURE Right 07/05/2020   Procedure: REVISION OPEN REDUCTION INTERNAL FIXATION (ORIF) RIGHT CLAVICLE FRACTURE, ACROMIOCLAVICULAR SEPARATION;  Surgeon: Jerri Kay HERO, MD;  Location: [facility];  Service: Orthopedics;  Laterality: Right;   PERCUTANEOUS PINNING PHALANX FRACTURE OF HAND Right    ROBOTIC ASSISTED TOTAL HYSTERECTOMY N/A 03/05/2014   Procedure: ROBOTIC ASSISTED TOTAL HYSTERECTOMY/BILATERAL SALPINGECTOMY;  Surgeon: Dickie DELENA Carder, MD;  Location: [facility];  Service: Gynecology;  Laterality: N/A;  TUBAL LIGATION     Social History   Occupational History   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 Pharm Tech  Tobacco Use   Smoking status: Never   Smokeless tobacco: Never  Vaping Use   Vaping status: Never Used  Substance and Sexual Activity   Alcohol use: Yes    Comment: no ETOH in 2 months   Drug use: No   Sexual activity: Yes    Adelyn Roscher Estela) Arlinda, M.D. [facility] 9:18 AM
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
## 2023-09-15 ENCOUNTER — Other Ambulatory Visit: Payer: Self-pay

## 2023-09-15 ENCOUNTER — Encounter (HOSPITAL_BASED_OUTPATIENT_CLINIC_OR_DEPARTMENT_OTHER): Payer: Self-pay | Admitting: Orthopedic Surgery

## 2023-09-16 ENCOUNTER — Ambulatory Visit: Payer: No Typology Code available for payment source | Admitting: Orthopaedic Surgery

## 2023-09-17 ENCOUNTER — Encounter (HOSPITAL_BASED_OUTPATIENT_CLINIC_OR_DEPARTMENT_OTHER): Payer: Self-pay | Admitting: Orthopedic Surgery

## 2023-09-17 ENCOUNTER — Ambulatory Visit (HOSPITAL_BASED_OUTPATIENT_CLINIC_OR_DEPARTMENT_OTHER): Payer: No Typology Code available for payment source

## 2023-09-17 ENCOUNTER — Ambulatory Visit (HOSPITAL_BASED_OUTPATIENT_CLINIC_OR_DEPARTMENT_OTHER): Payer: No Typology Code available for payment source | Admitting: Anesthesiology

## 2023-09-17 ENCOUNTER — Other Ambulatory Visit: Payer: Self-pay

## 2023-09-17 ENCOUNTER — Ambulatory Visit (HOSPITAL_BASED_OUTPATIENT_CLINIC_OR_DEPARTMENT_OTHER)
Admission: RE | Admit: 2023-09-17 | Discharge: 2023-09-17 | Disposition: A | Discharge status: Home / Self Care | Payer: No Typology Code available for payment source | Attending: Orthopedic Surgery | Admitting: Orthopedic Surgery

## 2023-09-17 ENCOUNTER — Encounter (HOSPITAL_BASED_OUTPATIENT_CLINIC_OR_DEPARTMENT_OTHER)
Admission: RE | Disposition: A | Discharge status: Home / Self Care | Payer: Self-pay | Source: Home / Self Care | Attending: Orthopedic Surgery

## 2023-09-17 ENCOUNTER — Other Ambulatory Visit: Payer: Self-pay | Admitting: Orthopedic Surgery

## 2023-09-17 DIAGNOSIS — I10 Essential (primary) hypertension: Secondary | ICD-10-CM | POA: Insufficient documentation

## 2023-09-17 DIAGNOSIS — J45909 Unspecified asthma, uncomplicated: Secondary | ICD-10-CM | POA: Insufficient documentation

## 2023-09-17 DIAGNOSIS — S62390A Other fracture of second metacarpal bone, right hand, initial encounter for closed fracture: Secondary | ICD-10-CM | POA: Diagnosis not present

## 2023-09-17 DIAGNOSIS — W2209XA Striking against other stationary object, initial encounter: Secondary | ICD-10-CM | POA: Insufficient documentation

## 2023-09-17 DIAGNOSIS — S62309A Unspecified fracture of unspecified metacarpal bone, initial encounter for closed fracture: Secondary | ICD-10-CM

## 2023-09-17 DIAGNOSIS — S62300A Unspecified fracture of second metacarpal bone, right hand, initial encounter for closed fracture: Secondary | ICD-10-CM

## 2023-09-17 DIAGNOSIS — K219 Gastro-esophageal reflux disease without esophagitis: Secondary | ICD-10-CM | POA: Diagnosis not present

## 2023-09-17 DIAGNOSIS — Z79899 Other long term (current) drug therapy: Secondary | ICD-10-CM | POA: Insufficient documentation

## 2023-09-17 DIAGNOSIS — S62600A Fracture of unspecified phalanx of right index finger, initial encounter for closed fracture: Secondary | ICD-10-CM | POA: Diagnosis present

## 2023-09-17 HISTORY — DX: Nausea with vomiting, unspecified: R11.2

## 2023-09-17 HISTORY — DX: Other specified postprocedural states: Z98.890

## 2023-09-17 HISTORY — DX: Unspecified fracture of unspecified metacarpal bone, initial encounter for closed fracture: S62.309A

## 2023-09-17 HISTORY — PX: OPEN REDUCTION INTERNAL FIXATION (ORIF) METACARPAL: SHX6234

## 2023-09-17 SURGERY — OPEN REDUCTION INTERNAL FIXATION (ORIF) METACARPAL
Anesthesia: Regional | Site: Finger | Laterality: Right

## 2023-09-17 MED ORDER — PROPOFOL 500 MG/50ML IV EMUL
INTRAVENOUS | Status: DC | PRN
  Administered 2023-09-17: 100 ug/kg/min via INTRAVENOUS

## 2023-09-17 MED ORDER — ROPIVACAINE HCL 5 MG/ML IJ SOLN
INTRAMUSCULAR | Status: DC | PRN
  Administered 2023-09-17: 30 mL via PERINEURAL

## 2023-09-17 MED ORDER — CEFAZOLIN SODIUM-DEXTROSE 2-4 GM/100ML-% IV SOLN
2.0000 g | INTRAVENOUS | Status: AC
Start: 2023-09-17 — End: 2023-09-17
  Administered 2023-09-17: 2 g via INTRAVENOUS

## 2023-09-17 MED ORDER — DROPERIDOL 2.5 MG/ML IJ SOLN
0.6250 mg | Freq: Once | INTRAMUSCULAR | Status: AC | PRN
  Administered 2023-09-17: 0.625 mg via INTRAVENOUS

## 2023-09-17 MED ORDER — DEXAMETHASONE SODIUM PHOSPHATE 4 MG/ML IJ SOLN
INTRAMUSCULAR | Status: DC | PRN
  Administered 2023-09-17: 5 mg via PERINEURAL

## 2023-09-17 MED ORDER — MIDAZOLAM HCL 2 MG/2ML IJ SOLN
INTRAMUSCULAR | Status: AC
  Filled 2023-09-17: qty 2

## 2023-09-17 MED ORDER — ONDANSETRON HCL 4 MG/2ML IJ SOLN
INTRAMUSCULAR | Status: DC | PRN
  Administered 2023-09-17: 4 mg via INTRAVENOUS

## 2023-09-17 MED ORDER — DROPERIDOL 2.5 MG/ML IJ SOLN
INTRAMUSCULAR | Status: AC
  Filled 2023-09-17: qty 2

## 2023-09-17 MED ORDER — MIDAZOLAM HCL 2 MG/2ML IJ SOLN
2.0000 mg | Freq: Once | INTRAMUSCULAR | Status: AC
  Administered 2023-09-17: 1 mg via INTRAVENOUS

## 2023-09-17 MED ORDER — MEPERIDINE HCL 25 MG/ML IJ SOLN: 6.2500 mg | INTRAMUSCULAR | Status: DC | PRN

## 2023-09-17 MED ORDER — SODIUM CHLORIDE 0.9 % IV SOLN: INTRAVENOUS | Status: DC | PRN

## 2023-09-17 MED ORDER — FENTANYL CITRATE (PF) 100 MCG/2ML IJ SOLN: 25.0000 ug | INTRAMUSCULAR | Status: DC | PRN

## 2023-09-17 MED ORDER — CLONIDINE HCL (ANALGESIA) 100 MCG/ML EP SOLN
EPIDURAL | Status: DC | PRN
  Administered 2023-09-17: 80 ug

## 2023-09-17 MED ORDER — 0.9 % SODIUM CHLORIDE (POUR BTL) OPTIME
TOPICAL | Status: DC | PRN
  Administered 2023-09-17: 700 mL

## 2023-09-17 MED ORDER — DEXMEDETOMIDINE HCL IN NACL 80 MCG/20ML IV SOLN
INTRAVENOUS | Status: DC | PRN
  Administered 2023-09-17 (×2): 4 ug via INTRAVENOUS

## 2023-09-17 MED ORDER — FENTANYL CITRATE (PF) 100 MCG/2ML IJ SOLN
INTRAMUSCULAR | Status: AC
  Filled 2023-09-17: qty 2

## 2023-09-17 MED ORDER — LACTATED RINGERS IV SOLN: INTRAVENOUS | Status: DC

## 2023-09-17 MED ORDER — PROPOFOL 10 MG/ML IV BOLUS
INTRAVENOUS | Status: DC | PRN
  Administered 2023-09-17: 20 mg via INTRAVENOUS
  Administered 2023-09-17: 50 mg via INTRAVENOUS
  Administered 2023-09-17: 20 mg via INTRAVENOUS

## 2023-09-17 MED ORDER — CEFAZOLIN SODIUM-DEXTROSE 2-4 GM/100ML-% IV SOLN
INTRAVENOUS | Status: AC
  Filled 2023-09-17: qty 100

## 2023-09-17 MED ORDER — ACETAMINOPHEN 325 MG PO TABS
650.0000 mg | ORAL_TABLET | Freq: Four times a day (QID) | ORAL | Status: DC | PRN
Start: 2023-09-17 — End: 2023-09-17

## 2023-09-17 MED ORDER — FENTANYL CITRATE (PF) 100 MCG/2ML IJ SOLN
100.0000 ug | Freq: Once | INTRAMUSCULAR | Status: AC
  Administered 2023-09-17: 50 ug via INTRAVENOUS

## 2023-09-17 SURGICAL SUPPLY — 51 items
BLADE SURG 15 STRL LF DISP TIS (BLADE) ×2 IMPLANT
BNDG COHESIVE 4X5 TAN STRL LF (GAUZE/BANDAGES/DRESSINGS) ×1 IMPLANT
BNDG ELASTIC 3INX 5YD STR LF (GAUZE/BANDAGES/DRESSINGS) IMPLANT
BNDG ELASTIC 4INX 5YD STR LF (GAUZE/BANDAGES/DRESSINGS) ×2 IMPLANT
BNDG ESMARK 4X9 LF (GAUZE/BANDAGES/DRESSINGS) ×1 IMPLANT
BNDG GAUZE DERMACEA FLUFF 4 (GAUZE/BANDAGES/DRESSINGS) ×1 IMPLANT
CHLORAPREP W/TINT 26 (MISCELLANEOUS) ×1 IMPLANT
CORD BIPOLAR FORCEPS 12FT (ELECTRODE) ×1 IMPLANT
COVER BACK TABLE 60X90IN (DRAPES) ×1 IMPLANT
CUFF TOURN SGL QUICK 18X4 (TOURNIQUET CUFF) IMPLANT
CUFF TRNQT CYL 24X4X16.5-23 (TOURNIQUET CUFF) ×1 IMPLANT
DRAPE HAND 75INX146IN 110IN (DRAPES) ×1 IMPLANT
DRAPE OEC MINIVIEW 54X84 (DRAPES) ×1 IMPLANT
DRAPE SURG 17X23 STRL (DRAPES) ×1 IMPLANT
DRILL COUNTERSINK CANN 3 (DRILL) IMPLANT
GAUZE PAD ABD 8X10 STRL (GAUZE/BANDAGES/DRESSINGS) ×1 IMPLANT
GAUZE SPONGE 4X4 12PLY STRL (GAUZE/BANDAGES/DRESSINGS) ×1 IMPLANT
GAUZE STRETCH 2X75IN STRL (MISCELLANEOUS) ×1 IMPLANT
GAUZE XEROFORM 1X8 LF (GAUZE/BANDAGES/DRESSINGS) ×1 IMPLANT
GLOVE BIO SURGEON STRL SZ7.5 (GLOVE) ×2 IMPLANT
GLOVE BIOGEL PI IND STRL 7.5 (GLOVE) ×1 IMPLANT
GOWN STRL REUS W/ TWL LRG LVL3 (GOWN DISPOSABLE) ×1 IMPLANT
GOWN STRL REUS W/TWL XL LVL3 (GOWN DISPOSABLE) ×1 IMPLANT
GOWN STRL SURGICAL XL XLNG (GOWN DISPOSABLE) ×1 IMPLANT
K-WIRE TROCAR 1.35 (MISCELLANEOUS) ×1
KWIRE TROCAR 1.35 (MISCELLANEOUS) IMPLANT
KWIRE W/LASER LINE SS QF .86 (WIRE) IMPLANT
MANIFOLD NEPTUNE II (INSTRUMENTS) ×1 IMPLANT
NDL HYPO 25X1 1.5 SAFETY (NEEDLE) IMPLANT
NDL HYPO 25X5/8 SAFETYGLIDE (NEEDLE) IMPLANT
NEEDLE HYPO 25X1 1.5 SAFETY (NEEDLE)
NEEDLE HYPO 25X5/8 SAFETYGLIDE (NEEDLE)
NS IRRIG 1000ML POUR BTL (IV SOLUTION) IMPLANT
PACK BASIN DAY SURGERY FS (CUSTOM PROCEDURE TRAY) ×1 IMPLANT
PAD CAST 4YDX4 CTTN HI CHSV (CAST SUPPLIES) ×2 IMPLANT
SCREW CANN COMP FT 2.5X14 (Screw) IMPLANT
SHEET MEDIUM DRAPE 40X70 STRL (DRAPES) ×1 IMPLANT
SLEEVE SCD COMPRESS KNEE MED (STOCKING) IMPLANT
SPIKE FLUID TRANSFER (MISCELLANEOUS) IMPLANT
SPLINT PLASTER CAST XFAST 4X15 (CAST SUPPLIES) ×10 IMPLANT
STOCKINETTE IMPERVIOUS 9X36 MD (GAUZE/BANDAGES/DRESSINGS) ×1 IMPLANT
SUCTION TUBE FRAZIER 10FR DISP (SUCTIONS) IMPLANT
SUT ETHILON 4 0 PS 2 18 (SUTURE) ×1 IMPLANT
SUT MNCRL AB 3-0 PS2 27 (SUTURE) ×1 IMPLANT
SUT VIC AB 3-0 FS2 27 (SUTURE) IMPLANT
SUT VIC AB 4-0 PS2 18 (SUTURE) IMPLANT
SYR BULB EAR ULCER 3OZ GRN STR (SYRINGE) ×2 IMPLANT
SYR CONTROL 10ML LL (SYRINGE) IMPLANT
TOWEL GREEN STERILE FF (TOWEL DISPOSABLE) ×2 IMPLANT
TUBE CONNECTING 20X1/4 (TUBING) IMPLANT
UNDERPAD 30X36 HEAVY ABSORB (UNDERPADS AND DIAPERS) ×1 IMPLANT

## 2023-09-17 NOTE — Interval H&P Note (Signed)
 History and Physical Interval Note:  09/17/2023 11:37 AM  Kelly Walton  has presented today for surgery, with the diagnosis of RIGHT INDEX FINGER METACARPAL HEAD FRACTURE.  The various methods of treatment have been discussed with the patient and family. After consideration of risks, benefits and other options for treatment, the patient has consented to  Procedure(s): RIGHT INDEX FINGER METACARPAL HEAD FRACTURE OPEN REDUCTION INTERNAL FIXATION (ORIF) METACARPAL (Right) POSSIBLE RIGHT INDEX FINGER METACARPAL HEAD FRACTURE CLOSED REDUCTION FINGER WITH PERCUTANEOUS PINNING (Right) as a surgical intervention.  The patient's history has been reviewed, patient examined, no change in status, stable for surgery.  I have reviewed the patient's chart and labs.  Questions were answered to the patient's satisfaction.     [name]

## 2023-09-17 NOTE — Op Note (Signed)
 NAME: Kelly Walton MEDICAL RECORD NO: [id number] DATE OF BIRTH: 1971-08-06 FACILITY: [facility] LOCATION: [facility] PHYSICIAN: GILDARDO ALDERTON, MD   OPERATIVE REPORT   DATE OF PROCEDURE: 09/17/23    PREOPERATIVE DIAGNOSIS: Right hand index finger metacarpal head fracture, intra-articular   POSTOPERATIVE DIAGNOSIS: Right hand index finger metacarpal head fracture, intra-articular   PROCEDURE: Right hand index finger metacarpal head fracture open reduction internal fixation   SURGEON:  Gildardo Alderton, M.D.   ASSISTANT: Search Dinsmore, OPA   ANESTHESIA:  Regional with sedation   INTRAVENOUS FLUIDS:  Per anesthesia flow sheet.   ESTIMATED BLOOD LOSS:  Minimal.   COMPLICATIONS:  None.   SPECIMENS:  none   TOURNIQUET TIME:    Total Tourniquet Time Documented: Upper Arm (Right) - 44 minutes Total: Upper Arm (Right) - 44 minutes    DISPOSITION:  Stable to PACU.   INDICATIONS: This is a 53 year old female who was referred to me for a right index finger metacarpal head fracture with displacement and intra-articular involvement.  Extensive discussion was had with the patient regarding her right index finger metacarpal head fracture.  New x-rays obtained were reviewed in detail with the patient as well which do show metacarpal head fracture with notable displacement and intra-articular involvement.  Given her ongoing pain and swelling in this region with limited motion and the associated radiographic workup, she is indicated for right index finger metacarpal head fracture closed reduction and percutaneous pinning versus possible open reduction internal fixation.  Given the timeline of her injury, I did discuss the likelihood for needing open reduction internal fixation of this injury.  Risks and benefits of the procedure were discussed, risks including but not limited to infection, bleeding, scarring, stiffness, nerve injury, tendon injury, vascular injury, hardware  complication, recurrence of symptoms and need for subsequent operation.  Patient expressed understanding.  She voiced understanding of these risks and elected to proceed.  OPERATIVE COURSE: Patient was seen and identified in the preoperative area and marked appropriately.  Surgical consent had been signed. Preoperative IV antibiotic prophylaxis was given. She was transferred to the operating room and placed in supine position with the Right upper extremity on an arm board.  Sedation was induced by the anesthesiologist. A regional block had been performed by anesthesia in preoperative holding.    Right upper extremity was prepped and draped in normal sterile orthopedic fashion.  A surgical pause was performed between the surgeons, anesthesia, and operating room staff and all were in agreement as to the patient, procedure, and site of procedure.  Tourniquet was placed and padded appropriately to the right upper arm.  We first began with attempted closed reduction of the metacarpal head fracture.  Pointed reduction forceps were utilized at the metacarpal head fracture region.  Using biplanar fluoroscopy, we attempted closed reduction.  However, the fracture fragments had began to heal, there was no significant mobility appreciated with attempted closed reduction.  At this juncture, decision was made to pursue open reduction and internal fixation.  Longitudinal incision was designed over the index finger MCP joint.  Incision was extended in curvilinear fashion over the metacarpal head.  Incision was carried down utilizing a 15 blade, flaps were elevated bluntly to expose the underlying extensor apparatus.  Extensor splitting approach was utilized sharply, to expose the underlying metacarpal phalangeal joint.  MCP capsule was opened in longitudinal fashion, flaps were elevated to expose the underlying metacarpal head fracture.  Significant comminution of the fracture with multiple intra-articular pieces were  appreciated.  Fracture was then mobilized utilizing osteotome and a Freer.  There was noted to be a significant intra-articular portion in an extended position with shortening which was appropriately mobilized and reduced under biplanar fluoroscopy.  Temporizing 0.054 K wire was then placed to hold the fracture reduction and checked with biplanar fluoroscopy.  This wire was to act as a derotational pin for a screw insertion.  Once were satisfied with our fracture reduction, a 2.5 mm Arthrex headless cannulated screw was selected.  Wire for the screw was first inserted in retrograde fashion across the fracture site.  This was followed by the drill.  Measurement was taken, we selected a 16 mm screw which was placed in retrograde fashion over the wire and buried beneath the articular surface.  Entry point for the screw was noted to be on the dorsal aspect of the MCP joint, away from the major articular portion.  Once the screw was seated appropriately, decision was made to leave the derotational pin in place temporarily for further stability of the fracture given the extensive comminution.  K wire was bent and cut appropriately.  Biplanar fluoroscopy was utilized for final fluoroscopic films confirming appropriate fracture reduction and maintenance of the joint surface.  Joint surfaces noted to be congruent in all planes.  Tourniquet was then deflated and copious irrigation was performed.  MCP capsule was closed utilizing 3-0 Vicryl in figure-of-eight fashion.  Extensor apparatus was also closed utilizing 3-0 Vicryl in running locked fashion.  Appropriate tendon gliding was appreciated after closure with appropriate stability of the extensor apparatus.  Skin closure was perform utilizing 4-0 nylon in horizontal mattress fashion.  Sterile dressings were applied using Xeroform, gauze, pin And Xeroform for the pin site.  Clamshell splint was then fabricated utilizing plaster, placing the hand in the intrinsic plus  position for comfort.  The tourniquet time was 44 minutes.  Fingertips were pink with brisk capillary refill after deflation of tourniquet.  The operative drapes were broken down.  The patient was awoken from anesthesia safely and taken to PACU in stable condition.  I will see her back in the office in  2 weeks  for postoperative followup.  She is remain nonweightbearing to the right upper extremity, splint will remain in place.  Discharge instructions were provided with dressing instructions as well as postoperative medications for pain control.   Jasemine Nawaz, MD Electronically signed, 09/17/23

## 2023-09-17 NOTE — Discharge Instructions (Addendum)
 Hand Surgery Postop Instructions   Dressings: Maintain postoperative dressing until orthopedic follow-up.  Keep operative site clean and dry until orthopedic follow-up.  Wound Care: Keep your hand elevated above the level of your heart.  Do not allow it to dangle by your side. Moving your fingers is advised to stimulate circulation but will depend on the site of your surgery.  If you have a splint applied, your doctor will advise you regarding movement.  Activity: Do not drive or operate machinery until clearance given from physician. No heavy lifting with operative extremity.  Diet:  Drink liquids today or eat a light diet.  You may resume a regular diet tomorrow.    General expectations: Take prescribed medication if given, transition to over-the-counter medication as quickly as possible. Fingers may become slightly swollen.  Call your doctor if any of the following occur: Severe pain not relieved by pain medication. Elevated temperature. Dressing soaked with blood. Inability to move fingers. White or bluish color to fingers.   Per [facility] clinic policy, our goal is ensure optimal postoperative pain control with a multimodal pain management strategy. For all [facility] patients, our goal is to wean post-operative narcotic medications by 6 weeks post-operatively. If this is not possible due to utilization of pain medication prior to surgery, your [facility] doctor will support your acute post-operative pain control for the first 6 weeks postoperatively, with a plan to transition you back to your primary pain team following that. Cyndia Skeeters will work to ensure a Therapist, occupational.  Naeema Patlan Trevor Mace, M.D. Hand Surgery [facility]  Post Anesthesia Home Care Instructions  Activity: Get plenty of rest for the remainder of the day. A responsible individual must stay with you for 24 hours following the procedure.  For the next 24 hours, DO NOT: -Drive a  car -Advertising copywriter -Drink alcoholic beverages -Take any medication unless instructed by your physician -Make any legal decisions or sign important papers.  Meals: Start with liquid foods such as gelatin or soup. Progress to regular foods as tolerated. Avoid greasy, spicy, heavy foods. If nausea and/or vomiting occur, drink only clear liquids until the nausea and/or vomiting subsides. Call your physician if vomiting continues.  Special Instructions/Symptoms: Your throat may feel dry or sore from the anesthesia or the breathing tube placed in your throat during surgery. If this causes discomfort, gargle with warm salt water. The discomfort should disappear within 24 hours.  If you had a scopolamine patch placed behind your ear for the management of post- operative nausea and/or vomiting:  1. The medication in the patch is effective for 72 hours, after which it should be removed.  Wrap patch in a tissue and discard in the trash. Wash hands thoroughly with soap and water. 2. You may remove the patch earlier than 72 hours if you experience unpleasant side effects which may include dry mouth, dizziness or visual disturbances. 3. Avoid touching the patch. Wash your hands with soap and water after contact with the patch.    Regional Anesthesia Blocks  1. You may not be able to move or feel the "blocked" extremity after a regional anesthetic block. This may last may last from 3-48 hours after placement, but it will go away. The length of time depends on the medication injected and your individual response to the medication. As the nerves start to wake up, you may experience tingling as the movement and feeling returns to your extremity. If the numbness and inability to move your extremity  has not gone away after 48 hours, please call your surgeon.   2. The extremity that is blocked will need to be protected until the numbness is gone and the strength has returned. Because you cannot feel it, you  will need to take extra care to avoid injury. Because it may be weak, you may have difficulty moving it or using it. You may not know what position it is in without looking at it while the block is in effect.  3. For blocks in the legs and feet, returning to weight bearing and walking needs to be done carefully. You will need to wait until the numbness is entirely gone and the strength has returned. You should be able to move your leg and foot normally before you try and bear weight or walk. You will need someone to be with you when you first try to ensure you do not fall and possibly risk injury.  4. Bruising and tenderness at the needle site are common side effects and will resolve in a few days.  5. Persistent numbness or new problems with movement should be communicated to the surgeon or the [facility] [phone] [facility] [phone]).

## 2023-09-17 NOTE — Anesthesia Preprocedure Evaluation (Addendum)
 Anesthesia Evaluation  Patient identified by MRN, date of birth, ID band Patient awake    Reviewed: Allergy  & Precautions, NPO status , Patient's Chart, lab work & pertinent test results  History of Anesthesia Complications (+) PONV and history of anesthetic complications  Airway Mallampati: II  TM Distance: >3 FB Neck ROM: Full    Dental  (+) Dental Advisory Given, Teeth Intact   Pulmonary asthma  07/02/2020 SARS coronavirus neg   Pulmonary exam normal breath sounds clear to auscultation       Cardiovascular hypertension, Pt. on medications (-) angina Normal cardiovascular exam Rhythm:Regular Rate:Normal  NM Stress 2021  Normal pharmacologic myocardial perfusion stress test.  The left ventricular ejection fraction is normal (>65%).  No significant coronary artery calcification is observed.  This is a low risk study.   Echo 2020  1. Left ventricular ejection fraction, by visual estimation, is 60 to 65%. The left ventricle has normal function. Normal left ventricular size. There is no left ventricular hypertrophy.   2. Left ventricular diastolic Doppler parameters are consistent with impaired relaxation pattern of LV diastolic filling.   3. Global right ventricle has normal systolic function.The right ventricular size is normal. No increase in right ventricular wall thickness.   4. Normal pulmonary artery systolic pressure.   5. Left atrial size was normal.   6. Sinus tachycardia rate 106 bpm      Neuro/Psych  Headaches PSYCHIATRIC DISORDERS Anxiety        GI/Hepatic ,GERD  ,,(+)     substance abuse  alcohol useH/o Elevated LFTs   Endo/Other  negative endocrine ROS    Renal/GU negative Renal ROS     Musculoskeletal  (+) Arthritis ,    Abdominal   Peds  Hematology negative hematology ROS (+)   Anesthesia Other Findings   Reproductive/Obstetrics                              Anesthesia Physical Anesthesia Plan  ASA: 2  Anesthesia Plan: Regional   Post-op Pain Management: Tylenol  PO (pre-op)*   Induction: Intravenous  PONV Risk Score and Plan: 3 and Ondansetron , Treatment may vary due to age or medical condition, Propofol  infusion, TIVA, Dexamethasone  and Midazolam   Airway Management Planned: Natural Airway  Additional Equipment: None  Intra-op Plan:   Post-operative Plan:   Informed Consent: I have reviewed the patients History and Physical, chart, labs and discussed the procedure including the risks, benefits and alternatives for the proposed anesthesia with the patient or authorized representative who has indicated his/her understanding and acceptance.     Dental advisory given  Plan Discussed with: CRNA  Anesthesia Plan Comments: (Risks of anesthesia explained at length. This includes, but is not limited to, sore throat, damage to teeth, lips gums, tongue and vocal cords, nausea and vomiting, reactions to medications, stroke, heart attack, and death. All patient questions were answered and the patient wishes to proceed. Risks of peripheral nerve block explained at length. This includes, but is not limited to, bleeding, infection, reactions to the medications, seizures, damage to surrounding structures, damage to nerves, permanent weakness, numbness, tingling and pain. All patient questions were answered and patient wishes to proceed with nerve block. )        Anesthesia Quick Evaluation

## 2023-09-17 NOTE — Transfer of Care (Signed)
 Immediate Anesthesia Transfer of Care Note  Patient: Kelly Walton  Procedure(s) Performed: RIGHT INDEX FINGER METACARPAL HEAD FRACTURE OPEN REDUCTION INTERNAL FIXATION (ORIF) METACARPAL (Right: Finger)  Patient Location: PACU  Anesthesia Type:MAC combined with regional for post-op pain  Level of Consciousness: sedated  Airway & Oxygen Therapy: Patient Spontanous Breathing and Patient connected to face mask oxygen  Post-op Assessment: Report given to RN and Post -op Vital signs reviewed and stable  Post vital signs: Reviewed and stable  Last Vitals:  Vitals Value Taken Time  BP 120/82 09/17/23 1330  Temp    Pulse 65 09/17/23 1332  Resp 12 09/17/23 1332  SpO2 100 % 09/17/23 1332  Vitals shown include unfiled device data.  Last Pain:  Vitals:   09/17/23 1006  TempSrc: Oral  PainSc: 7       Patients Stated Pain Goal: 9 (09/17/23 1006)  Complications: No notable events documented.

## 2023-09-17 NOTE — Progress Notes (Signed)
 Assisted Dr. Renold Don with right, axillary, ultrasound guided block. Side rails up, monitors on throughout procedure. See vital signs in flow sheet. Tolerated Procedure well.

## 2023-09-17 NOTE — Anesthesia Procedure Notes (Signed)
 Anesthesia Regional Block: Axillary brachial plexus block   Pre-Anesthetic Checklist: , timeout performed,  Correct Patient, Correct Site, Correct Laterality,  Correct Procedure, Correct Position, site marked,  Risks and benefits discussed,  Surgical consent,  Pre-op evaluation,  At surgeon's request and post-op pain management  Laterality: Upper and Right  Prep: chloraprep       Needles:  Injection technique: Single-shot  Needle Type: Stimiplex          Additional Needles:   Procedures:,,,, ultrasound used (permanent image in chart),,    Narrative:  Start time: 09/17/2023 10:52 AM End time: 09/17/2023 11:12 AM Injection made incrementally with aspirations every 5 mL.  Performed by: Personally  Anesthesiologist: Darlyn Rush, MD  Additional Notes: BP cuff, SpO2 and EKG monitors applied. Sedation begun. Nerve location verified with ultrasound. Anesthetic injected incrementally, slowly, and after neg aspirations under direct u/s guidance. Good perineural spread. Tolerated well.

## 2023-09-17 NOTE — Anesthesia Postprocedure Evaluation (Signed)
 Anesthesia Post Note  Patient: Ashante A Suh  Procedure(s) Performed: RIGHT INDEX FINGER METACARPAL HEAD FRACTURE OPEN REDUCTION INTERNAL FIXATION (ORIF) METACARPAL (Right: Finger)     Patient location during evaluation: PACU Anesthesia Type: Regional and MAC Level of consciousness: awake Pain management: pain level controlled Vital Signs Assessment: post-procedure vital signs reviewed and stable Respiratory status: spontaneous breathing, nonlabored ventilation and respiratory function stable Cardiovascular status: stable and blood pressure returned to baseline Postop Assessment: no apparent nausea or vomiting Anesthetic complications: no   No notable events documented.  Last Vitals:  Vitals:   09/17/23 1400 09/17/23 1415  BP: 138/89 (!) 142/99  Pulse: 69 70  Resp: 16 16  Temp:  (!) 36.2 C  SpO2: 98% 98%    Last Pain:  Vitals:   09/17/23 1415  TempSrc:   PainSc: 0-No pain                 Delon Aisha Arch

## 2023-09-19 NOTE — Anesthesia Postprocedure Evaluation (Signed)
 Anesthesia Post Note  Patient: Kelly Walton  Procedure(s) Performed: RIGHT INDEX FINGER METACARPAL HEAD FRACTURE OPEN REDUCTION INTERNAL FIXATION (ORIF) METACARPAL (Right: Finger)     Patient location during evaluation: PACU Anesthesia Type: Regional and MAC Level of consciousness: awake Pain management: pain level controlled Vital Signs Assessment: post-procedure vital signs reviewed and stable Respiratory status: spontaneous breathing, nonlabored ventilation and respiratory function stable Cardiovascular status: stable and blood pressure returned to baseline Postop Assessment: no apparent nausea or vomiting Anesthetic complications: no   No notable events documented.  Last Vitals:  Vitals:   09/17/23 1400 09/17/23 1415  BP: 138/89 (!) 142/99  Pulse: 69 70  Resp: 16 16  Temp:  (!) 36.2 C  SpO2: 98% 98%    Last Pain:  Vitals:   09/17/23 1415  TempSrc:   PainSc: 0-No pain                 Delon Aisha Arch

## 2023-09-20 ENCOUNTER — Telehealth: Payer: Self-pay | Admitting: Orthopedic Surgery

## 2023-09-20 ENCOUNTER — Encounter (HOSPITAL_BASED_OUTPATIENT_CLINIC_OR_DEPARTMENT_OTHER): Payer: Self-pay | Admitting: Orthopedic Surgery

## 2023-09-20 ENCOUNTER — Encounter: Payer: Self-pay | Admitting: Orthopedic Surgery

## 2023-09-20 ENCOUNTER — Other Ambulatory Visit: Payer: Self-pay | Admitting: Orthopedic Surgery

## 2023-09-20 NOTE — Telephone Encounter (Signed)
 Patient would like Casali to call her. Confused to why she needs a splint.

## 2023-09-29 ENCOUNTER — Ambulatory Visit: Payer: No Typology Code available for payment source | Admitting: Orthopedic Surgery

## 2023-09-29 ENCOUNTER — Encounter: Payer: Self-pay | Admitting: Orthopedic Surgery

## 2023-09-29 ENCOUNTER — Other Ambulatory Visit (INDEPENDENT_AMBULATORY_CARE_PROVIDER_SITE_OTHER): Payer: Self-pay

## 2023-09-29 DIAGNOSIS — S62390A Other fracture of second metacarpal bone, right hand, initial encounter for closed fracture: Secondary | ICD-10-CM

## 2023-09-29 NOTE — Progress Notes (Unsigned)
   Kelly Walton - 53 y.o. female MRN [id number]  Date of birth: 1970-10-25  Office Visit Note: Visit Date: 09/29/2023 PCP: Doreene Nest, NP Referred by: Doreene Nest, NP  Subjective:  HPI: Kelly Walton is a 53 y.o. female who presents today for follow up 2 weeks status post right index finger metacarpal head fracture ORIF.  Pertinent ROS were reviewed with the patient and found to be negative unless otherwise specified above in HPI.   Assessment & Plan: Visit Diagnoses: No diagnosis found.  Plan: ***  Follow-up: No follow-ups on file.   Meds & Orders: No orders of the defined types were placed in this encounter.  No orders of the defined types were placed in this encounter.    Procedures: No procedures performed       Objective:   Vital Signs: LMP 03/05/2014 Comment: LMP x 69yrs ago   Ortho Exam ***  Imaging: No results found.   Chrishawn Boley Trevor Mace, M.D. [facility] 11:07 AM

## 2023-09-30 ENCOUNTER — Other Ambulatory Visit: Payer: Self-pay | Admitting: Orthopedic Surgery

## 2023-09-30 MED ORDER — OXYCODONE HCL 5 MG PO TABS: 5.0000 mg | ORAL_TABLET | Freq: Four times a day (QID) | ORAL | 0 refills | Status: DC | PRN

## 2023-10-01 ENCOUNTER — Other Ambulatory Visit: Payer: Self-pay | Admitting: Orthopedic Surgery

## 2023-10-01 MED ORDER — OXYCODONE HCL 5 MG PO TABS: 5.0000 mg | ORAL_TABLET | Freq: Four times a day (QID) | ORAL | 0 refills | Status: DC | PRN

## 2023-10-04 ENCOUNTER — Encounter: Payer: No Typology Code available for payment source | Admitting: Orthopedic Surgery

## 2023-10-06 ENCOUNTER — Other Ambulatory Visit: Payer: Self-pay | Admitting: Orthopedic Surgery

## 2023-10-07 ENCOUNTER — Other Ambulatory Visit: Payer: Self-pay | Admitting: Orthopedic Surgery

## 2023-10-07 ENCOUNTER — Encounter: Payer: Self-pay | Admitting: Orthopedic Surgery

## 2023-10-07 MED ORDER — OXYCODONE HCL 5 MG PO TABS: 5.0000 mg | ORAL_TABLET | Freq: Four times a day (QID) | ORAL | 0 refills | Status: DC | PRN

## 2023-10-14 ENCOUNTER — Encounter: Payer: No Typology Code available for payment source | Admitting: Rehabilitative and Restorative Service Providers"

## 2023-10-14 ENCOUNTER — Ambulatory Visit: Payer: No Typology Code available for payment source | Admitting: Behavioral Health

## 2023-10-15 ENCOUNTER — Encounter: Payer: Self-pay | Admitting: Orthopedic Surgery

## 2023-10-18 NOTE — Therapy (Signed)
OUTPATIENT OCCUPATIONAL THERAPY ORTHO EVALUATION  Patient Name: Kelly Walton MRN: [id number] DOB:07/13/1971, 53 y.o., female Today's Date: 10/21/2023  PCP: Allayne Gitelman NP REFERRING PROVIDER: Samuella Cota, MD   END OF SESSION:  OT End of Session - 10/21/23 0849     Visit Number 1    Number of Visits 10    Date for OT Re-Evaluation 12/03/23    Authorization Type [employer]    OT Start Time 0849    OT Stop Time 0954    OT Time Calculation (min) 65 min    Equipment Utilized During Treatment Orthotic materials    Activity Tolerance Patient tolerated treatment well;Patient limited by fatigue;Patient limited by pain;No increased pain    Behavior During Therapy [facility] for tasks assessed/performed;Anxious             Past Medical History:  Diagnosis Date   AC separation, right, initial encounter    Acute conjunctivitis of left eye 12/27/2020   Acute right lower quadrant pain 06/10/2018   Alcohol abuse    Anxiety    Arthritis    Asthma    Hand injury, right, initial encounter 09/26/2019   History of fracture of clavicle 06/11/2020   Hypertension    Insomnia    Insomnia    Pain of upper abdomen 06/21/2017   Palpitations    a. 05/2019 Echo: EF 60-65%, diast dysfxn; b. 05/2019 Zio: Sinus rhythm-sinus tach. Avg HR 105. 6 SVT episodes - longest 7 beats. Rare PVCs-->Diltiazem added.   Periumbilical abdominal pain 03/12/2020   PONV (postoperative nausea and vomiting)    Spongiotic dermatitis    Past Surgical History:  Procedure Laterality Date   ABDOMINAL HYSTERECTOMY     CESAREAN SECTION     FOOT FRACTURE SURGERY Right    OPEN REDUCTION INTERNAL FIXATION (ORIF) METACARPAL Right 09/17/2023   Procedure: RIGHT INDEX FINGER METACARPAL HEAD FRACTURE OPEN REDUCTION INTERNAL FIXATION (ORIF) METACARPAL;  Surgeon: Samuella Cota, MD;  Location: [facility];  Service: Orthopedics;  Laterality: Right;   ORIF CLAVICULAR FRACTURE Right 06/17/2020   Procedure: OPEN REDUCTION  INTERNAL FIXATION (ORIF) RIGHT CLAVICLE FRACTURE, ACROMIOCLAVICULAR SEPARATION;  Surgeon: Tarry Kos, MD;  Location: [facility];  Service: Orthopedics;  Laterality: Right;   ORIF CLAVICULAR FRACTURE Right 07/05/2020   Procedure: REVISION OPEN REDUCTION INTERNAL FIXATION (ORIF) RIGHT CLAVICLE FRACTURE, ACROMIOCLAVICULAR SEPARATION;  Surgeon: Tarry Kos, MD;  Location: [facility];  Service: Orthopedics;  Laterality: Right;   PERCUTANEOUS PINNING PHALANX FRACTURE OF HAND Right    ROBOTIC ASSISTED TOTAL HYSTERECTOMY N/A 03/05/2014   Procedure: ROBOTIC ASSISTED TOTAL HYSTERECTOMY/BILATERAL SALPINGECTOMY;  Surgeon: Serita Kyle, MD;  Location: [facility];  Service: Gynecology;  Laterality: N/A;   TUBAL LIGATION     Patient Active Problem List   Diagnosis Date Noted   Closed fracture of head of metacarpal bone 09/17/2023   Pain in right hand 09/07/2023   Rash and other nonspecific skin eruption 06/29/2023   Unexplained weight loss 02/25/2023   Vitamin D deficiency 11/06/2022   Elevated lipase 11/06/2022   Left upper quadrant abdominal pain 08/04/2022   Mass of soft tissue of abdomen 08/04/2022   GERD (gastroesophageal reflux disease) 04/07/2022   Frequent headaches 10/02/2021   Night sweats 10/02/2021   Dizziness 04/18/2021   Tachycardia 04/18/2021   Dyspareunia, female 04/24/2020   Diarrhea 03/12/2020   Toe pain, chronic, left 02/28/2020   GAD (generalized anxiety disorder) 01/02/2020   Alcohol abuse 01/02/2020   Palpitations 04/03/2019   Seasonal  allergies 01/16/2019   Elevated LFTs 11/15/2018   Spongiotic dermatitis 07/08/2018   Hyperlipidemia 07/08/2018   Asthma 08/20/2017   Preventative health care 06/21/2017   Insomnia 04/16/2017   Essential hypertension 04/16/2017    ONSET DATE: DOS 09/17/23  REFERRING DIAG: Z61.096E (ICD-10-CM) - Closed displaced fracture of other part of second metacarpal bone of right hand, initial encounter   THERAPY DIAG:  Muscle  weakness (generalized)  Localized edema  Other lack of coordination  Stiffness of right hand, not elsewhere classified  Pain in right hand  Rationale for Evaluation and Treatment: Rehabilitation  SUBJECTIVE:   SUBJECTIVE STATEMENT: 4+  weeks status post right index finger metacarpal head fracture ORIF. She works @ [employer].   She states her truck Writer on her hand, breaking it.    Pt accompanied by: significant other  PERTINENT HISTORY:   past Rt clavicle fx, past Rt hand fx (>3 years ago)  PRECAUTIONS: None  RED FLAGS: None   WEIGHT BEARING RESTRICTIONS: Yes nonweightbearing in right hand for at least 2-4 more weeks  PAIN:  Are you having pain? Yes: NPRS scale: 2-3/10 at rest, 10/10 in past week Pain location: Rt hand IF Pain description: Aching and sometimes sharp Aggravating factors: Attempted motion Relieving factors: Rest and immobilization  FALLS: Has patient fallen in last 6 months? No  LIVING ENVIRONMENT: Lives with: lives with their family Lives in: House/apartment Has following equipment at home: None  PLOF: Independent  PATIENT GOALS: Improve use of right dominant hand  NEXT MD VISIT: As needed   OBJECTIVE: (All objective assessments below are from initial evaluation on: 10/21/23 unless otherwise specified.)   HAND DOMINANCE: Right   ADLs: Overall ADLs: States decreased ability to grab, hold household objects, pain and difficulty to open containers, perform FMS tasks (manipulate fasteners on clothing), mild to moderate bathing problems as well.    FUNCTIONAL OUTCOME MEASURES: Eval: Quick DASH 88% impairment today  (Higher % Score  =  More Impairment)    UPPER EXTREMITY ROM     Shoulder to Wrist AROM Right eval  Forearm supination 80  Forearm pronation  84  Wrist flexion 24  Wrist extension 32  Wrist ulnar deviation   Wrist radial deviation   Functional dart thrower's motion (F-DTM) in ulnar flexion   F-DTM in radial  extension    (Blank rows = not tested)   Hand AROM Right eval  Full Fist Ability (or Gap to Distal Palmar Crease) Unable, 8.8cm gap from tip IF to [facility]  Thumb Opposition  (Kapandji Scale)  1/10  Thumb MCP (0-60) 0-15  Thumb IP (0-80) 0-25  Index MCP (0-90) (-18)  - 23  Index PIP (0-100) (-22) - 35  Index DIP (0-70) (-4) - 0  (Blank rows = not tested)   UPPER EXTREMITY MMT:    Eval:  NT at eval due to recent and still healing injuries. Will be tested when appropriate.   MMT Right TBD  Elbow flexion   Elbow extension   Forearm supination   Forearm pronation   Wrist flexion   Wrist extension   Wrist ulnar deviation   Wrist radial deviation   (Blank rows = not tested)  HAND FUNCTION: Eval: Observed weakness in affected Rt hand.  Details will be tested when safe Grip strength Right: NT lbs, Left: 51 lbs   COORDINATION: Eval: Observed coordination impairments with affected right hand.  Details will be tested in the next 1-2 sessions as able  Box and Blocks  Test: TBD blocks today (TBD is [facility]); 9 Hole Peg Test Right: TBD sec (TBD sec is WFL)   SENSATION: Eval:  Light touch diminished around sx area and around the thumb and index finger volarly and dorsally today deep pressure and temperature sense intact  EDEMA:   Eval:  Mildly swollen in right hand and wrist today  COGNITION: Eval: Overall cognitive status: WFL for evaluation today, though highly anxious  OBSERVATIONS:   Eval: She has some hypersensitivity and anxiety for which she flinches her hand away when OT lightly touches her skin.  This does improve by the end of the session.  MP joint is very tight and whole finger and thumb seems to have some inhibited motion from pain and also may be some nerve compression from swelling  Right index finger metacarpal fracture status post ORIF   TODAY'S TREATMENT:  Post-evaluation treatment:   For safety/self-care she was told to do no weightbearing or strong gripping or  pinching through her right dominant hand for at least 4 more weeks or until told to do so by her doctor or the therapist.  She was given education on desensitization and light touch techniques also for edema reduction with her hand above her heart.  Custom orthotic fabrication was indicated due to pt's broken right hand and need for safe, functional positioning. OT fabricated custom hand-based MP blocking orthosis for pt today to immobilize and protect the right hand. It fit well with no areas of pressure, pt states a comfortable fit, though MP joints were so stiff they could only be positioned at about a 25 degree flexion.  This may need adjusted session to session to achieve 40 to 60 degrees as able. Pt was educated on the wearing schedule (on at all times except for hygiene and exercises), to avoid exposing it to sources of heat, to wipe clean as needed (do not wash, use harsh detergents), to call or come in ASAP if it is causing any irritation or is not achieving desired function. It will be checked/adjusted in upcoming sessions, as needed. Pt states understanding all directions.    Additionally she was given the following home exercise program to be performed 4-6 times a day with mid range active range of motion to light tension and no significant pain.  She can use ice and heat modalities for 5 to 10 minutes as helpful, and each of these exercises were demonstrated to her with her stating understanding.  Exercises - Reach arms upward   - 4 x daily - 10 reps - Bend and Pull Back Wrist SLOWLY  - 4 x daily - 10-15 reps - Finger Spreading  - 4-6 x daily - 10-15 reps - Tendon Glides  - 4-6 x daily - 3-5 reps - 2-3 seconds hold - Thumb Opposition  - 4-6 x daily - 10 reps - Thumb stretch  - 4 x daily - 3-5 reps - 15-20 sec hold Patient Education - Scar Massage    PATIENT EDUCATION: Education details: See tx section above for details  Person educated: Patient Education method: Verbal Instruction,  Teach back, Handouts  Education comprehension: States and demonstrates understanding, Additional Education required    HOME EXERCISE PROGRAM: Access Code: [email or web address] URL: [email or web address] Date: 10/21/2023 Prepared by: Fannie Knee   GOALS: Goals reviewed with patient? Yes   SHORT TERM GOALS: (STG required if POC>30 days) Target Date: 11/05/2023  Pt will obtain protective, custom orthotic. Goal status: 10/21/2023 MET   2.  Pt will demo/state understanding  of initial HEP to improve pain levels and prerequisite motion. Goal status: INITIAL   LONG TERM GOALS: Target Date: 12/03/2023  Pt will improve functional ability by decreased impairment per Quick DASH assessment from 88% to 25% or better, for better quality of life. Goal status: INITIAL  2.  Pt will improve grip strength in Rt hand from NT to at least 35lbs for functional use at home and in IADLs. Goal status: INITIAL  3.  Pt will improve A/ROM in right index finger total active motion from 14 degrees to at least 200 degrees, to have functional motion for tasks like reach and grasp.  Goal status: INITIAL  4.  Pt will improve strength in right wrist flexion/extension from apparent 3/5 MMT to at least 4+/5 MMT to have increased functional ability to carry out selfcare and higher-level homecare tasks with less difficulty. Goal status: INITIAL  5.  Pt will improve coordination skills in right dominant hand, as seen by within functional limit score on nine-hole peg testing to have increased functional ability to carry out fine motor tasks (fasteners, etc.) and more complex, coordinated IADLs (meal prep, sports, etc.).  Goal status: INITIAL  6.  Pt will decrease pain at worst from 10/10 to 3/10 or better to have better sleep and occupational participation in daily roles. Goal status: INITIAL   ASSESSMENT:  CLINICAL IMPRESSION: Patient is a 52 y.o. female who was seen today for occupational therapy  evaluation for stiffness, weakness, swelling in right dominant hand after fracture and subsequent ORIF surgery.  She will benefit from outpatient occupational therapy to increase skills and quality of life safely.   PERFORMANCE DEFICITS: in functional skills including ADLs, IADLs, coordination, dexterity, proprioception, sensation, edema, ROM, strength, pain, fascial restrictions, muscle spasms, Fine motor control, body mechanics, endurance, decreased knowledge of precautions, wound, and UE functional use, cognitive skills including problem solving and safety awareness, and psychosocial skills including coping strategies, environmental adaptation, and habits.   IMPAIRMENTS: are limiting patient from ADLs, IADLs, rest and sleep, work, leisure, and social participation.   COMORBIDITIES: may have co-morbidities  that affects occupational performance. Patient will benefit from skilled OT to address above impairments and improve overall function.  MODIFICATION OR ASSISTANCE TO COMPLETE EVALUATION: No modification of tasks or assist necessary to complete an evaluation.  OT OCCUPATIONAL PROFILE AND HISTORY: Problem focused assessment: Including review of records relating to presenting problem.  CLINICAL DECISION MAKING: Moderate - several treatment options, min-mod task modification necessary  REHAB POTENTIAL: Excellent  EVALUATION COMPLEXITY: Low      PLAN:  OT FREQUENCY: 1-2x/week  OT DURATION: 6 weeks through 12/03/2023 and up to 10 total visits as needed  PLANNED INTERVENTIONS: 97168 OT Re-evaluation, 97535 self care/ADL training, 65784 therapeutic exercise, 97530 therapeutic activity, 97112 neuromuscular re-education, 97140 manual therapy, 97035 ultrasound, 97039 fluidotherapy, 97010 moist heat, 97010 cryotherapy, 97034 contrast bath, 97760 Orthotics management and training, 69629 Splinting (initial encounter), [id number] Subsequent splinting/medication, scar mobilization, compression bandaging,  Dry needling, energy conservation, coping strategies training, and patient/family education  RECOMMENDED OTHER SERVICES: None now  CONSULTED AND AGREED WITH PLAN OF CARE: Patient and family member/caregiver  PLAN FOR NEXT SESSION:   Check orthosis as needed, check initial home exercise program and recommendations and upgrade per protocols and as tolerated   Fannie Knee, OTR/L, CHT 10/21/2023, 10:06 AM

## 2023-10-21 ENCOUNTER — Ambulatory Visit: Payer: No Typology Code available for payment source | Admitting: Orthopedic Surgery

## 2023-10-21 ENCOUNTER — Encounter: Payer: Self-pay | Admitting: Rehabilitative and Restorative Service Providers"

## 2023-10-21 ENCOUNTER — Ambulatory Visit (INDEPENDENT_AMBULATORY_CARE_PROVIDER_SITE_OTHER): Payer: No Typology Code available for payment source | Admitting: Rehabilitative and Restorative Service Providers"

## 2023-10-21 ENCOUNTER — Ambulatory Visit (INDEPENDENT_AMBULATORY_CARE_PROVIDER_SITE_OTHER): Payer: No Typology Code available for payment source

## 2023-10-21 DIAGNOSIS — S62390A Other fracture of second metacarpal bone, right hand, initial encounter for closed fracture: Secondary | ICD-10-CM

## 2023-10-21 DIAGNOSIS — R278 Other lack of coordination: Secondary | ICD-10-CM | POA: Diagnosis not present

## 2023-10-21 DIAGNOSIS — M6281 Muscle weakness (generalized): Secondary | ICD-10-CM | POA: Diagnosis not present

## 2023-10-21 DIAGNOSIS — R600 Localized edema: Secondary | ICD-10-CM

## 2023-10-21 DIAGNOSIS — M79641 Pain in right hand: Secondary | ICD-10-CM

## 2023-10-21 DIAGNOSIS — M25641 Stiffness of right hand, not elsewhere classified: Secondary | ICD-10-CM

## 2023-10-21 NOTE — Progress Notes (Signed)
   [name] - 53 y.o. female MRN [id number]  Date of birth: 12/21/1970  Office Visit Note: Visit Date: 10/21/2023 PCP: Doreene Nest, NP Referred by: Doreene Nest, NP  Subjective:  HPI: Kelly Walton is a 53 y.o. female who presents today for follow up 5 weeks status post right index finger metacarpal head fracture open reduction internal fixation.  She is doing well overall, pain remains controlled.  Has been compliant with the immobilization as instructed.  Pertinent ROS were reviewed with the patient and found to be negative unless otherwise specified above in HPI.   Assessment & Plan: Visit Diagnoses:  1. Closed displaced fracture of other part of second metacarpal bone of right hand, initial encounter     Plan: She continues to do well postoperatively, x-rays reviewed today show stable appearance of the pinning and screw placement in the index finger metacarpal head.  There is a small cortical area outside of the joint surface with some residual displacement, however the joint line remains intact which is consistent with her clinical examination.  She is able to perform gentle range of motion at the digit without significant crepitus or pain.  Pin was removed today without incident.  She was seen by occupational therapy and fitted for a removable brace.  She can begin range of motion activities.  Will wait until postoperative week 8 to begin formalized strengthening.  I will plan on seeing her back in approximate 3 weeks for recheck.  Follow-up: No follow-ups on file.   Meds & Orders: No orders of the defined types were placed in this encounter.   Orders Placed This Encounter  Procedures   XR Hand Complete Right     Procedures: No procedures performed       Objective:   Vital Signs: LMP 03/05/2014 Comment: LMP x 18yrs ago   Ortho Exam Right hand - Mild swelling over the dorsal aspect of the hand, particularly at the index finger MCP region - Pin site clean  dry and intact, pin removed today without incident, dressing applied - Sensation intact throughout the hand distally - Hand is warm well-perfused - Gentle range of motion of the digits, index MCP 0-15 without significant pain or crepitus, slightly improved passively  Imaging: Imaging of the right hand taken today, interpretation in chart   Maikel Neisler Fara Boros) Denese Killings, M.D. [facility]

## 2023-10-25 ENCOUNTER — Telehealth: Payer: Self-pay | Admitting: Rehabilitative and Restorative Service Providers"

## 2023-10-25 NOTE — Telephone Encounter (Signed)
Called pt to offer a f/u visit this week- today at 845, 315 or Thurs at 4pm.  Left a message.

## 2023-10-26 ENCOUNTER — Other Ambulatory Visit: Payer: Self-pay | Admitting: Orthopedic Surgery

## 2023-10-28 NOTE — Therapy (Signed)
 OUTPATIENT OCCUPATIONAL THERAPY TREATMENT NOTE  Patient Name: Kelly Walton MRN: [id number] DOB:May 13, 1971, 53 y.o., female Today's Date: 11/01/2023  PCP: Allayne Gitelman NP REFERRING PROVIDER: Samuella Cota, MD   END OF SESSION:  OT End of Session - 11/01/23 1153     Visit Number 2    Number of Visits 10    Date for OT Re-Evaluation 12/03/23    Authorization Type [employer]    OT Start Time 1154    OT Stop Time 1241    OT Time Calculation (min) 47 min    Equipment Utilized During Treatment buddy strap    Activity Tolerance Patient tolerated treatment well;Patient limited by fatigue;Patient limited by pain;No increased pain    Behavior During Therapy [facility] for tasks assessed/performed              Past Medical History:  Diagnosis Date   AC separation, right, initial encounter    Acute conjunctivitis of left eye 12/27/2020   Acute right lower quadrant pain 06/10/2018   Alcohol abuse    Anxiety    Arthritis    Asthma    Hand injury, right, initial encounter 09/26/2019   History of fracture of clavicle 06/11/2020   Hypertension    Insomnia    Insomnia    Pain of upper abdomen 06/21/2017   Palpitations    a. 05/2019 Echo: EF 60-65%, diast dysfxn; b. 05/2019 Zio: Sinus rhythm-sinus tach. Avg HR 105. 6 SVT episodes - longest 7 beats. Rare PVCs-->Diltiazem added.   Periumbilical abdominal pain 03/12/2020   PONV (postoperative nausea and vomiting)    Spongiotic dermatitis    Past Surgical History:  Procedure Laterality Date   ABDOMINAL HYSTERECTOMY     CESAREAN SECTION     FOOT FRACTURE SURGERY Right    OPEN REDUCTION INTERNAL FIXATION (ORIF) METACARPAL Right 09/17/2023   Procedure: RIGHT INDEX FINGER METACARPAL HEAD FRACTURE OPEN REDUCTION INTERNAL FIXATION (ORIF) METACARPAL;  Surgeon: Samuella Cota, MD;  Location: [facility];  Service: Orthopedics;  Laterality: Right;   ORIF CLAVICULAR FRACTURE Right 06/17/2020   Procedure: OPEN REDUCTION INTERNAL FIXATION  (ORIF) RIGHT CLAVICLE FRACTURE, ACROMIOCLAVICULAR SEPARATION;  Surgeon: Tarry Kos, MD;  Location:  SURGERY CENTER;  Service: Orthopedics;  Laterality: Right;   ORIF CLAVICULAR FRACTURE Right 07/05/2020   Procedure: REVISION OPEN REDUCTION INTERNAL FIXATION (ORIF) RIGHT CLAVICLE FRACTURE, ACROMIOCLAVICULAR SEPARATION;  Surgeon: Tarry Kos, MD;  Location: [facility];  Service: Orthopedics;  Laterality: Right;   PERCUTANEOUS PINNING PHALANX FRACTURE OF HAND Right    ROBOTIC ASSISTED TOTAL HYSTERECTOMY N/A 03/05/2014   Procedure: ROBOTIC ASSISTED TOTAL HYSTERECTOMY/BILATERAL SALPINGECTOMY;  Surgeon: Serita Kyle, MD;  Location: [facility];  Service: Gynecology;  Laterality: N/A;   TUBAL LIGATION     Patient Active Problem List   Diagnosis Date Noted   Closed fracture of head of metacarpal bone 09/17/2023   Pain in right hand 09/07/2023   Rash and other nonspecific skin eruption 06/29/2023   Unexplained weight loss 02/25/2023   Vitamin D deficiency 11/06/2022   Elevated lipase 11/06/2022   Left upper quadrant abdominal pain 08/04/2022   Mass of soft tissue of abdomen 08/04/2022   GERD (gastroesophageal reflux disease) 04/07/2022   Frequent headaches 10/02/2021   Night sweats 10/02/2021   Dizziness 04/18/2021   Tachycardia 04/18/2021   Dyspareunia, female 04/24/2020   Diarrhea 03/12/2020   Toe pain, chronic, left 02/28/2020   GAD (generalized anxiety disorder) 01/02/2020   Alcohol abuse 01/02/2020   Palpitations 04/03/2019  Seasonal allergies 01/16/2019   Elevated LFTs 11/15/2018   Spongiotic dermatitis 07/08/2018   Hyperlipidemia 07/08/2018   Asthma 08/20/2017   Preventative health care 06/21/2017   Insomnia 04/16/2017   Essential hypertension 04/16/2017    ONSET DATE: DOS 09/17/23  REFERRING DIAG: Z30.865H (ICD-10-CM) - Closed displaced fracture of other part of second metacarpal bone of right hand, initial encounter   THERAPY DIAG:  Muscle weakness  (generalized)  Localized edema  Other lack of coordination  Stiffness of right hand, not elsewhere classified  Pain in right hand  Rationale for Evaluation and Treatment: Rehabilitation  PERTINENT HISTORY:   past Rt clavicle fx, past Rt hand fx (>3 years ago) She works @ [employer].   She states her truck Writer on her hand, breaking it.   PRECAUTIONS: None  RED FLAGS: None   WEIGHT BEARING RESTRICTIONS: Yes nonweightbearing in right hand for at least 2-4 more weeks   SUBJECTIVE:   SUBJECTIVE STATEMENT: 6+  weeks status post right index finger metacarpal head fracture ORIF. She states still having some hypersensitivity and some stiffness but her pain is significantly decreased and she is able to move her thumb now..    Pt accompanied by: significant other   PAIN:  Are you having pain? Yes: NPRS scale:  3/10 at rest, 3-4/10 in past week Pain location: Rt hand IF Pain description: Aching and sometimes sharp Aggravating factors: Attempted motion Relieving factors: Rest and immobilization   PATIENT GOALS: Improve use of right dominant hand  NEXT MD VISIT: As needed   OBJECTIVE: (All objective assessments below are from initial evaluation on: 10/21/23 unless otherwise specified.)   HAND DOMINANCE: Right   ADLs: Overall ADLs: States decreased ability to grab, hold household objects, pain and difficulty to open containers, perform FMS tasks (manipulate fasteners on clothing), mild to moderate bathing problems as well.    FUNCTIONAL OUTCOME MEASURES: Eval: Quick DASH 88% impairment today  (Higher % Score  =  More Impairment)    UPPER EXTREMITY ROM     Shoulder to Wrist AROM Right eval Rt 11/01/23  Forearm supination 80   Forearm pronation  84   Wrist flexion 24 52  Wrist extension 32 70  Wrist ulnar deviation    Wrist radial deviation    Functional dart thrower's motion (F-DTM) in ulnar flexion    F-DTM in radial extension     (Blank rows = not  tested)   Hand AROM Right eval Rt 11/01/23  Full Fist Ability (or Gap to Distal Palmar Crease) Unable, 8.8cm gap from tip IF to [facility] 6.8cm gap now from IF to [facility]  Thumb Opposition  (Kapandji Scale)  1/10 8/10  Can tough IF now easily   Thumb MCP (0-60) 0-15 0-59  Thumb IP (0-80) 0-25 0-33  Index MCP (0-90) (-18)  - 23 (-28) - 31  Index PIP (0-100) (-22) - 35 (- 24) - 64  Index DIP (0-70) (-4) - 0 0- 41  (Blank rows = not tested)   UPPER EXTREMITY MMT:    Eval:  NT at eval due to recent and still healing injuries. Will be tested when appropriate.   MMT Right TBD  Elbow flexion   Elbow extension   Forearm supination   Forearm pronation   Wrist flexion   Wrist extension   Wrist ulnar deviation   Wrist radial deviation   (Blank rows = not tested)  HAND FUNCTION: Eval: Observed weakness in affected Rt hand.  Details will be tested  when safe Grip strength Right: NT lbs, Left: 51 lbs   COORDINATION: Eval: Observed coordination impairments with affected right hand.  Details will be tested in the next 1-2 sessions as able  Box and Blocks Test: TBD blocks today (TBD is [facility]); 9 Hole Peg Test Right: TBD sec (TBD sec is WFL)   SENSATION: Eval:  Light touch diminished around sx area and around the thumb and index finger volarly and dorsally today deep pressure and temperature sense intact  EDEMA:   Eval:  Mildly swollen in right hand and wrist today  COGNITION: Eval: Overall cognitive status: WFL for evaluation today, though highly anxious  OBSERVATIONS:   Eval: She has some hypersensitivity and anxiety for which she flinches her hand away when OT lightly touches her skin.  This does improve by the end of the session.  MP joint is very tight and whole finger and thumb seems to have some inhibited motion from pain and also may be some nerve compression from swelling  Right index finger metacarpal fracture status post ORIF   TODAY'S TREATMENT:  11/01/23: She starts with active  range of motion for new measures and review of exercises which shows much better active range of motion now.  She still is significantly hypersensitive to touch, and OT reviews desensitization with progressive manual techniques with her for neuromuscular reeducation.  OT also discusses weaning from her orthosis for light functional activities at this point.  When she is not wearing it, she should be wearing buddy straps to help support her finger.  She was given 1 today, as well as a web spacer for her finger as it is somewhat ulnarly deviated at the MCP joint.  Her orthosis was also adjusted to allow more MCP joint flexion now.  Her home exercises were also upgraded to include light tolerable stretches as listed below and bolded.  Each 1 was explained and demonstrated with her demonstration back.  She leaves with no significantly increased pain stating understanding all directions.    Exercises - Finger Spreading  - 4-6 x daily - 10-15 reps - Tendon Glides  - 4-6 x daily - 3-5 reps - 2-3 seconds hold - Wrist Flexion Stretch  - 4 x daily - 3-5 reps - 15 sec hold - Wrist Extension Stretch Pronated  - 4 x daily - 3-5 reps - 15 hold - BACK KNUCKLE STRETCHES   - 4 x daily - 3-5 reps - 15 sec hold - HOOK Stretch  - 4 x daily - 3-5 reps - 15-20 sec hold - Seated Finger Composite Flexion Stretch  - 4 x daily - 3-5 reps - 15 hold - PUSH KNUCKLES DOWN  - 4 x daily - 3-5 reps - 15 seconds hold - Hand AROM Reverse Blocking  - 4-6 x daily - 10-15 reps      PATIENT EDUCATION: Education details: See tx section above for details  Person educated: Patient Education method: Engineer, structural, Teach back, Handouts  Education comprehension: States and demonstrates understanding, Additional Education required    HOME EXERCISE PROGRAM: Access Code: [email or web address] URL: [email or web address] Date: 10/21/2023 Prepared by: Fannie Knee   GOALS: Goals reviewed with patient? Yes   SHORT TERM  GOALS: (STG required if POC>30 days) Target Date: 11/05/2023  Pt will obtain protective, custom orthotic. Goal status: 10/21/2023 MET   2.  Pt will demo/state understanding of initial HEP to improve pain levels and prerequisite motion. Goal status: INITIAL   LONG TERM GOALS: Target Date: 12/03/2023  Pt  will improve functional ability by decreased impairment per Quick DASH assessment from 88% to 25% or better, for better quality of life. Goal status: INITIAL  2.  Pt will improve grip strength in Rt hand from NT to at least 35lbs for functional use at home and in IADLs. Goal status: INITIAL  3.  Pt will improve A/ROM in right index finger total active motion from 14 degrees to at least 200 degrees, to have functional motion for tasks like reach and grasp.  Goal status: INITIAL  4.  Pt will improve strength in right wrist flexion/extension from apparent 3/5 MMT to at least 4+/5 MMT to have increased functional ability to carry out selfcare and higher-level homecare tasks with less difficulty. Goal status: INITIAL  5.  Pt will improve coordination skills in right dominant hand, as seen by within functional limit score on nine-hole peg testing to have increased functional ability to carry out fine motor tasks (fasteners, etc.) and more complex, coordinated IADLs (meal prep, sports, etc.).  Goal status: INITIAL  6.  Pt will decrease pain at worst from 10/10 to 3/10 or better to have better sleep and occupational participation in daily roles. Goal status: INITIAL   ASSESSMENT:  CLINICAL IMPRESSION: 11/01/23: She states she may choose to only come 1 time a week and she was also unable to come last week which is a bit of a problem.  OT advises to perhaps continue twice a week as she is still very tender and stiff, but ultimately is up to her.  Continue on  Eval: Patient is a 53 y.o. female who was seen today for occupational therapy evaluation for stiffness, weakness, swelling in right  dominant hand after fracture and subsequent ORIF surgery.  She will benefit from outpatient occupational therapy to increase skills and quality of life safely.     PLAN:  OT FREQUENCY: 1-2x/week  OT DURATION: 6 weeks through 12/03/2023 and up to 10 total visits as needed  PLANNED INTERVENTIONS: 97168 OT Re-evaluation, 97535 self care/ADL training, 91478 therapeutic exercise, 97530 therapeutic activity, 97112 neuromuscular re-education, 97140 manual therapy, 97035 ultrasound, 97039 fluidotherapy, 97010 moist heat, 97010 cryotherapy, 97034 contrast bath, 97760 Orthotics management and training, 29562 Splinting (initial encounter), [id number] Subsequent splinting/medication, scar mobilization, compression bandaging, Dry needling, energy conservation, coping strategies training, and patient/family education  RECOMMENDED OTHER SERVICES: None now  CONSULTED AND AGREED WITH PLAN OF CARE: Patient and family member/caregiver  PLAN FOR NEXT SESSION:   She may need some mobilizing orthoses to help with her extension and flexion if stretches do not help improve this.  She has been limited by hypersensitivity so try to do fluidotherapy machine as well.   Fannie Knee, OTR/L, CHT 11/01/2023, 1:00 PM

## 2023-10-29 ENCOUNTER — Ambulatory Visit: Payer: No Typology Code available for payment source | Admitting: Professional Counselor

## 2023-10-29 DIAGNOSIS — Z0389 Encounter for observation for other suspected diseases and conditions ruled out: Secondary | ICD-10-CM

## 2023-10-29 NOTE — Progress Notes (Signed)
 Pt did not show for scheduled appointment. No message.  Bartolo Darter, [facility]

## 2023-11-01 ENCOUNTER — Encounter: Payer: Self-pay | Admitting: Rehabilitative and Restorative Service Providers"

## 2023-11-01 ENCOUNTER — Ambulatory Visit (INDEPENDENT_AMBULATORY_CARE_PROVIDER_SITE_OTHER): Payer: No Typology Code available for payment source | Admitting: Rehabilitative and Restorative Service Providers"

## 2023-11-01 DIAGNOSIS — M79641 Pain in right hand: Secondary | ICD-10-CM

## 2023-11-01 DIAGNOSIS — R600 Localized edema: Secondary | ICD-10-CM

## 2023-11-01 DIAGNOSIS — R278 Other lack of coordination: Secondary | ICD-10-CM

## 2023-11-01 DIAGNOSIS — M6281 Muscle weakness (generalized): Secondary | ICD-10-CM

## 2023-11-01 DIAGNOSIS — M25641 Stiffness of right hand, not elsewhere classified: Secondary | ICD-10-CM | POA: Diagnosis not present

## 2023-11-03 ENCOUNTER — Encounter: Payer: No Typology Code available for payment source | Admitting: Rehabilitative and Restorative Service Providers"

## 2023-11-04 ENCOUNTER — Other Ambulatory Visit: Payer: Self-pay | Admitting: Orthopedic Surgery

## 2023-11-05 NOTE — Therapy (Signed)
 OUTPATIENT OCCUPATIONAL THERAPY TREATMENT NOTE  Patient Name: Kelly Walton MRN: [id number] DOB:[birth year], 53 y.o., female Today's Date: 11/08/2023  PCP: Allayne Gitelman NP REFERRING PROVIDER: Samuella Cota, MD   END OF SESSION:  OT End of Session - 11/08/23 1148     Visit Number 3    Number of Visits 10    Date for OT Re-Evaluation 12/03/23    Authorization Type [employer]    OT Start Time 1148    OT Stop Time 1235    OT Time Calculation (min) 47 min    Equipment Utilized During Treatment orthotic materials    Activity Tolerance Patient tolerated treatment well;Patient limited by fatigue;Patient limited by pain;No increased pain    Behavior During Therapy [facility] for tasks assessed/performed               Past Medical History:  Diagnosis Date   AC separation, right, initial encounter    Acute conjunctivitis of left eye 12/27/2020   Acute right lower quadrant pain 06/10/2018   Alcohol abuse    Anxiety    Arthritis    Asthma    Hand injury, right, initial encounter 09/26/2019   History of fracture of clavicle 06/11/2020   Hypertension    Insomnia    Insomnia    Pain of upper abdomen 06/21/2017   Palpitations    a. 05/2019 Echo: EF 60-65%, diast dysfxn; b. 05/2019 Zio: Sinus rhythm-sinus tach. Avg HR 105. 6 SVT episodes - longest 7 beats. Rare PVCs-->Diltiazem added.   Periumbilical abdominal pain 03/12/2020   PONV (postoperative nausea and vomiting)    Spongiotic dermatitis    Past Surgical History:  Procedure Laterality Date   ABDOMINAL HYSTERECTOMY     CESAREAN SECTION     FOOT FRACTURE SURGERY Right    OPEN REDUCTION INTERNAL FIXATION (ORIF) METACARPAL Right 09/17/2023   Procedure: RIGHT INDEX FINGER METACARPAL HEAD FRACTURE OPEN REDUCTION INTERNAL FIXATION (ORIF) METACARPAL;  Surgeon: Samuella Cota, MD;  Location: [facility];  Service: Orthopedics;  Laterality: Right;   ORIF CLAVICULAR FRACTURE Right 06/17/2020   Procedure: OPEN REDUCTION INTERNAL  FIXATION (ORIF) RIGHT CLAVICLE FRACTURE, ACROMIOCLAVICULAR SEPARATION;  Surgeon: Tarry Kos, MD;  Location: [facility];  Service: Orthopedics;  Laterality: Right;   ORIF CLAVICULAR FRACTURE Right 07/05/2020   Procedure: REVISION OPEN REDUCTION INTERNAL FIXATION (ORIF) RIGHT CLAVICLE FRACTURE, ACROMIOCLAVICULAR SEPARATION;  Surgeon: Tarry Kos, MD;  Location: [facility];  Service: Orthopedics;  Laterality: Right;   PERCUTANEOUS PINNING PHALANX FRACTURE OF HAND Right    ROBOTIC ASSISTED TOTAL HYSTERECTOMY N/A 03/05/2014   Procedure: ROBOTIC ASSISTED TOTAL HYSTERECTOMY/BILATERAL SALPINGECTOMY;  Surgeon: Serita Kyle, MD;  Location: [facility];  Service: Gynecology;  Laterality: N/A;   TUBAL LIGATION     Patient Active Problem List   Diagnosis Date Noted   Closed fracture of head of metacarpal bone 09/17/2023   Pain in right hand 09/07/2023   Rash and other nonspecific skin eruption 06/29/2023   Unexplained weight loss 02/25/2023   Vitamin D deficiency 11/06/2022   Elevated lipase 11/06/2022   Left upper quadrant abdominal pain 08/04/2022   Mass of soft tissue of abdomen 08/04/2022   GERD (gastroesophageal reflux disease) 04/07/2022   Frequent headaches 10/02/2021   Night sweats 10/02/2021   Dizziness 04/18/2021   Tachycardia 04/18/2021   Dyspareunia, female 04/24/2020   Diarrhea 03/12/2020   Toe pain, chronic, left 02/28/2020   GAD (generalized anxiety disorder) 01/02/2020   Alcohol abuse 01/02/2020   Palpitations 04/03/2019  Seasonal allergies 01/16/2019   Elevated LFTs 11/15/2018   Spongiotic dermatitis 07/08/2018   Hyperlipidemia 07/08/2018   Asthma 08/20/2017   Preventative health care 06/21/2017   Insomnia 04/16/2017   Essential hypertension 04/16/2017    ONSET DATE: DOS 09/17/23  REFERRING DIAG: Z56.387F (ICD-10-CM) - Closed displaced fracture of other part of second metacarpal bone of right hand, initial encounter   THERAPY DIAG:  Muscle weakness  (generalized)  Localized edema  Stiffness of right hand, not elsewhere classified  Other lack of coordination  Pain in right hand  Rationale for Evaluation and Treatment: Rehabilitation  PERTINENT HISTORY:   past Rt clavicle fx, past Rt hand fx (>3 years ago) She works @ [employer].   She states her truck Writer on her hand, breaking it.   PRECAUTIONS: None  RED FLAGS: None   WEIGHT BEARING RESTRICTIONS: Yes nonweightbearing in right hand for at least 2-4 more weeks   SUBJECTIVE:   SUBJECTIVE STATEMENT: 7+  weeks status post right index finger metacarpal head fracture ORIF. She states doing long stretches and home exercise program but not feeling much progress in motion at the MCP joint.  Other joints and other tasks do seem to be improving.    Pt accompanied by: significant other   PAIN:  Are you having pain? Yes: NPRS scale:  1-2/10 at rest, 3-4/10 in past week Pain location: Rt hand IF Pain description: Aching and sometimes sharp Aggravating factors: Attempted motion Relieving factors: Rest and immobilization   PATIENT GOALS: Improve use of right dominant hand  NEXT MD VISIT: As needed   OBJECTIVE: (All objective assessments below are from initial evaluation on: 10/21/23 unless otherwise specified.)   HAND DOMINANCE: Right   ADLs: Overall ADLs: States decreased ability to grab, hold household objects, pain and difficulty to open containers, perform FMS tasks (manipulate fasteners on clothing), mild to moderate bathing problems as well.    FUNCTIONAL OUTCOME MEASURES: Eval: Quick DASH 88% impairment today  (Higher % Score  =  More Impairment)    UPPER EXTREMITY ROM     Shoulder to Wrist AROM Right eval Rt 11/01/23 Rt 11/08/23  Forearm supination 80    Forearm pronation  84    Wrist flexion 24 52 56  Wrist extension 32 70 72  Wrist ulnar deviation     Wrist radial deviation     Functional dart thrower's motion (F-DTM) in ulnar flexion      F-DTM in radial extension      (Blank rows = not tested)   Hand AROM Right eval Rt 11/01/23 Rt 11/08/23  Full Fist Ability (or Gap to Distal Palmar Crease) Unable, 8.8cm gap from tip IF to [facility] 6.8cm gap now from IF to [facility] 3.2cm gap now   Thumb Opposition  (Kapandji Scale)  1/10 8/10  Can tough IF now easily    Thumb MCP (0-60) 0-15 0-59   Thumb IP (0-80) 0-25 0-33   Index MCP (0-90) (-18)  - 23 (-28) - 31 (-28) - 40  Index PIP (0-100) (-22) - 35 (- 24) - 64 (-22) - 79  Index DIP (0-70) (-4) - 0 0- 41 0- 60  (Blank rows = not tested)   UPPER EXTREMITY MMT:    Eval:  NT at eval due to recent and still healing injuries. Will be tested when appropriate.   MMT Right TBD  Elbow flexion   Elbow extension   Forearm supination   Forearm pronation   Wrist flexion  Wrist extension   Wrist ulnar deviation   Wrist radial deviation   (Blank rows = not tested)  HAND FUNCTION: Eval: Observed weakness in affected Rt hand.  Details will be tested when safe Grip strength Right: NT lbs, Left: 51 lbs   COORDINATION: Eval: Observed coordination impairments with affected right hand.  Details will be tested in the next 1-2 sessions as able   SENSATION: Eval:  Light touch diminished around sx area and around the thumb and index finger volarly and dorsally today deep pressure and temperature sense intact  EDEMA:   11/07/33: none now   OBSERVATIONS:   11/08/23:  now no longer very hypersensitive, MCP joint is stiffest, but all is improving.   Eval: She has some hypersensitivity and anxiety for which she flinches her hand away when OT lightly touches her skin.  This does improve by the end of the session.  MP joint is very tight and whole finger and thumb seems to have some inhibited motion from pain and also may be some nerve compression from swelling  Right index finger metacarpal fracture status post ORIF   TODAY'S TREATMENT:  11/08/23: She starts with active range of motion for exercise as  well as new measures which shows good improvements but not much improvement at this stiff MCP joint.  Her hypersensitivity is much better, and now she tolerates fluidotherapy for desensitization for 5 minutes, and OT also reviews systematic desensitization with touching with different textures.   We then review her home exercise program which is still appropriate, before OT custom fabricates a new dynamic flexion orthosis that targets the MCP joint specifically.  This was a dynamic and complex brace and its fabrication took the rest of the remaining session time.  It fit well, she states "I like this" and she was educated to wear it about 3 times a day for 30 minutes at a time as tolerated as an adjunct to her current home exercise program.    Exercises reviewed today: - Finger Spreading  - 4-6 x daily - 10-15 reps - Tendon Glides  - 4-6 x daily - 3-5 reps - 2-3 seconds hold - Wrist Flexion Stretch  - 4 x daily - 3-5 reps - 15 sec hold - Wrist Extension Stretch Pronated  - 4 x daily - 3-5 reps - 15 hold - BACK KNUCKLE STRETCHES   - 4 x daily - 3-5 reps - 15 sec hold - HOOK Stretch  - 4 x daily - 3-5 reps - 15-20 sec hold - Seated Finger Composite Flexion Stretch  - 4 x daily - 3-5 reps - 15 hold - PUSH KNUCKLES DOWN  - 4 x daily - 3-5 reps - 15 seconds hold - Hand AROM Reverse Blocking  - 4-6 x daily - 10-15 reps    PATIENT EDUCATION: Education details: See tx section above for details  Person educated: Patient Education method: Engineer, structural, Teach back, Handouts  Education comprehension: States and demonstrates understanding, Additional Education required    HOME EXERCISE PROGRAM: Access Code: [email or web address] URL: [email or web address] Date: 10/21/2023 Prepared by: Fannie Knee   GOALS: Goals reviewed with patient? Yes   SHORT TERM GOALS: (STG required if POC>30 days) Target Date: 11/05/2023  Pt will obtain protective, custom orthotic. Goal status: 10/21/2023  MET   2.  Pt will demo/state understanding of initial HEP to improve pain levels and prerequisite motion. Goal status: INITIAL   LONG TERM GOALS: Target Date: 12/03/2023  Pt will improve functional ability by decreased  impairment per Quick DASH assessment from 88% to 25% or better, for better quality of life. Goal status: INITIAL  2.  Pt will improve grip strength in Rt hand from NT to at least 35lbs for functional use at home and in IADLs. Goal status: INITIAL  3.  Pt will improve A/ROM in right index finger total active motion from 14 degrees to at least 200 degrees, to have functional motion for tasks like reach and grasp.  Goal status: INITIAL  4.  Pt will improve strength in right wrist flexion/extension from apparent 3/5 MMT to at least 4+/5 MMT to have increased functional ability to carry out selfcare and higher-level homecare tasks with less difficulty. Goal status: INITIAL  5.  Pt will improve coordination skills in right dominant hand, as seen by within functional limit score on nine-hole peg testing to have increased functional ability to carry out fine motor tasks (fasteners, etc.) and more complex, coordinated IADLs (meal prep, sports, etc.).  Goal status: INITIAL  6.  Pt will decrease pain at worst from 10/10 to 3/10 or better to have better sleep and occupational participation in daily roles. Goal status: INITIAL   ASSESSMENT:  CLINICAL IMPRESSION: 11/08/23: Due to her stubborn stiffness at the MCP joint specifically, a dynamic mobility orthosis was necessary, and it was fabricated and seem to work well today.  She should continue to use caution with weightbearing, use her hand for light functional activities now, and continue home exercise program.  Carry on   PLAN:  OT FREQUENCY: 1-2x/week  OT DURATION: 6 weeks through 12/03/2023 and up to 10 total visits as needed  PLANNED INTERVENTIONS: 97168 OT Re-evaluation, 97535 self care/ADL training, 09811 therapeutic  exercise, 97530 therapeutic activity, 97112 neuromuscular re-education, 97140 manual therapy, 97035 ultrasound, 97039 fluidotherapy, 97010 moist heat, 97010 cryotherapy, 97034 contrast bath, 97760 Orthotics management and training, 91478 Splinting (initial encounter), [id number] Subsequent splinting/medication, scar mobilization, compression bandaging, Dry needling, energy conservation, coping strategies training, and patient/family education  CONSULTED AND AGREED WITH PLAN OF CARE: Patient and family member/caregiver  PLAN FOR NEXT SESSION:   Check new dynamic orthosis, continue to wean from protective orthosis for light functional activities, and start light hand strengthening in 1 week as tolerated   Fannie Knee, OTR/L, CHT 11/08/2023, 6:15 PM

## 2023-11-08 ENCOUNTER — Ambulatory Visit (INDEPENDENT_AMBULATORY_CARE_PROVIDER_SITE_OTHER): Payer: No Typology Code available for payment source | Admitting: Rehabilitative and Restorative Service Providers"

## 2023-11-08 ENCOUNTER — Encounter: Payer: Self-pay | Admitting: Rehabilitative and Restorative Service Providers"

## 2023-11-08 DIAGNOSIS — R600 Localized edema: Secondary | ICD-10-CM | POA: Diagnosis not present

## 2023-11-08 DIAGNOSIS — M79641 Pain in right hand: Secondary | ICD-10-CM

## 2023-11-08 DIAGNOSIS — M25641 Stiffness of right hand, not elsewhere classified: Secondary | ICD-10-CM | POA: Diagnosis not present

## 2023-11-08 DIAGNOSIS — M6281 Muscle weakness (generalized): Secondary | ICD-10-CM

## 2023-11-08 DIAGNOSIS — R278 Other lack of coordination: Secondary | ICD-10-CM | POA: Diagnosis not present

## 2023-11-10 ENCOUNTER — Encounter: Payer: No Typology Code available for payment source | Admitting: Rehabilitative and Restorative Service Providers"

## 2023-11-12 ENCOUNTER — Ambulatory Visit: Payer: No Typology Code available for payment source | Admitting: Professional Counselor

## 2023-11-15 ENCOUNTER — Encounter: Payer: No Typology Code available for payment source | Admitting: Rehabilitative and Restorative Service Providers"

## 2023-11-15 ENCOUNTER — Telehealth: Payer: Self-pay | Admitting: Rehabilitative and Restorative Service Providers"

## 2023-11-15 NOTE — Telephone Encounter (Signed)
OT called patient to discuss missed appointment. OT spoke with pt about next appointment date/time. They were reminded of the attendance policy and future missed appointments could lead to early discharge from therapy.  

## 2023-11-18 ENCOUNTER — Other Ambulatory Visit (INDEPENDENT_AMBULATORY_CARE_PROVIDER_SITE_OTHER): Payer: Self-pay

## 2023-11-18 ENCOUNTER — Ambulatory Visit: Payer: No Typology Code available for payment source | Admitting: Orthopedic Surgery

## 2023-11-18 DIAGNOSIS — S62390A Other fracture of second metacarpal bone, right hand, initial encounter for closed fracture: Secondary | ICD-10-CM

## 2023-11-18 NOTE — Progress Notes (Signed)
   Kelly Walton - 53 y.o. female MRN [id number]  Date of birth: 09/25/1970  Office Visit Note: Visit Date: 11/18/2023 PCP: Doreene Nest, NP Referred by: Doreene Nest, NP  Subjective:  HPI: Kelly Walton is a 53 y.o. female who presents today for follow up 9 weeks status post right index finger metacarpal head fracture open reduction internal fixation.  She has returned to work, has discontinued the fabricated splint by occupational therapy.  Range of motion of the digit is improving, ongoing stiffness at the PIP region.  Pertinent ROS were reviewed with the patient and found to be negative unless otherwise specified above in HPI.   Assessment & Plan: Visit Diagnoses:  1. Closed displaced fracture of other part of second metacarpal bone of right hand, initial encounter     Plan: She is doing well postoperatively.  X-rays today demonstrate appropriate healing and congruence of the index finger MP joint.  She does have some ongoing stiffness at the PIP with flexion and extension which she states she is working out with occupational therapy.  At this time, we will also allow her to progress with strengthening with occupational therapy.  She sees OT next week for her next visit.  I will plan on seeing her back in 1 month to track her progress.  Follow-up: Return in about 1 month (around 12/19/2023).   Meds & Orders: No orders of the defined types were placed in this encounter.   Orders Placed This Encounter  Procedures   XR Hand Complete Right     Procedures: No procedures performed       Objective:   Vital Signs: LMP 03/05/2014 Comment: LMP x 69yrs ago   Ortho Exam Right hand - Mild swelling over the dorsal aspect of the hand, particularly at the index finger MCP region - Sensation intact throughout the hand distally - Hand is warm well-perfused - Gentle range of motion of the digits, index MCP 0-75 without significant pain or crepitus, slightly improved passively,  index PIP 5-50, slightly improved passively - Slight extensor lag at the MP of the index finger, approximately 5-10 degrees - Collateral testing at the MP joint without significant instability, extensor tendon is well-positioned without evidence of subluxation with flexion extension  Imaging: XR Hand Complete Right Result Date: 11/18/2023 X-rays of the right hand demonstrate stable appearance of the index finger metacarpal head screw, joint line appears congruent at the MP joint at the bony interface.  Slight ulnar deviation at the index MP joint is seen best on the AP view.    Jessey Huyett Trevor Mace, M.D. [facility], Hand Surgery

## 2023-11-19 NOTE — Therapy (Addendum)
 OUTPATIENT OCCUPATIONAL THERAPY TREATMENT & DISCHARGE  NOTE  Patient Name: Kelly Walton MRN: [id number] DOB:1971-02-27, 53 y.o., female Today's Date: 11/22/2023  PCP: Gretta BEAL NP REFERRING PROVIDER: Arlinda Buster, MD                       OCCUPATIONAL THERAPY DISCHARGE SUMMARY  Visits from Start of Care: 4  Pt did not return to OT and goals could not be finalized.  The patient  is officially D/C therapy today per protocols. See note for additional details.   Melvenia Ada, OTR/L, CHT 07/07/24               END OF SESSION:  OT End of Session - 11/22/23 1103     Visit Number 4    Number of Visits 10    Date for OT Re-Evaluation 12/03/23    Authorization Type [employer]    OT Start Time 1103    OT Stop Time 1148    OT Time Calculation (min) 45 min    Equipment Utilized During Treatment orthotic materials    Activity Tolerance Patient tolerated treatment well;Patient limited by fatigue;Patient limited by pain;No increased pain    Behavior During Therapy [facility] for tasks assessed/performed             Past Medical History:  Diagnosis Date   AC separation, right, initial encounter    Acute conjunctivitis of left eye 12/27/2020   Acute right lower quadrant pain 06/10/2018   Alcohol abuse    Anxiety    Arthritis    Asthma    Hand injury, right, initial encounter 09/26/2019   History of fracture of clavicle 06/11/2020   Hypertension    Insomnia    Insomnia    Pain of upper abdomen 06/21/2017   Palpitations    a. 05/2019 Echo: EF 60-65%, diast dysfxn; b. 05/2019 Zio: Sinus rhythm-sinus tach. Avg HR 105. 6 SVT episodes - longest 7 beats. Rare PVCs-->Diltiazem  added.   Periumbilical abdominal pain 03/12/2020   PONV (postoperative nausea and vomiting)    Spongiotic dermatitis    Past Surgical History:  Procedure Laterality Date   ABDOMINAL HYSTERECTOMY     CESAREAN SECTION     FOOT FRACTURE SURGERY Right    OPEN REDUCTION INTERNAL  FIXATION (ORIF) METACARPAL Right 09/17/2023   Procedure: RIGHT INDEX FINGER METACARPAL HEAD FRACTURE OPEN REDUCTION INTERNAL FIXATION (ORIF) METACARPAL;  Surgeon: Arlinda Buster, MD;  Location: [facility];  Service: Orthopedics;  Laterality: Right;   ORIF CLAVICULAR FRACTURE Right 06/17/2020   Procedure: OPEN REDUCTION INTERNAL FIXATION (ORIF) RIGHT CLAVICLE FRACTURE, ACROMIOCLAVICULAR SEPARATION;  Surgeon: Jerri Kay HERO, MD;  Location: [facility];  Service: Orthopedics;  Laterality: Right;   ORIF CLAVICULAR FRACTURE Right 07/05/2020   Procedure: REVISION OPEN REDUCTION INTERNAL FIXATION (ORIF) RIGHT CLAVICLE FRACTURE, ACROMIOCLAVICULAR SEPARATION;  Surgeon: Jerri Kay HERO, MD;  Location: [facility];  Service: Orthopedics;  Laterality: Right;   PERCUTANEOUS PINNING PHALANX FRACTURE OF HAND Right    ROBOTIC ASSISTED TOTAL HYSTERECTOMY N/A 03/05/2014   Procedure: ROBOTIC ASSISTED TOTAL HYSTERECTOMY/BILATERAL SALPINGECTOMY;  Surgeon: Dickie DELENA Carder, MD;  Location: [facility];  Service: Gynecology;  Laterality: N/A;   TUBAL LIGATION     Patient Active Problem List   Diagnosis Date Noted   Closed fracture of head of metacarpal bone 09/17/2023   Pain in right hand 09/07/2023   Rash and other nonspecific skin eruption 06/29/2023   Unexplained weight loss 02/25/2023   Vitamin D   deficiency 11/06/2022   Elevated lipase 11/06/2022   Left upper quadrant abdominal pain 08/04/2022   Mass of soft tissue of abdomen 08/04/2022   GERD (gastroesophageal reflux disease) 04/07/2022   Frequent headaches 10/02/2021   Night sweats 10/02/2021   Dizziness 04/18/2021   Tachycardia 04/18/2021   Dyspareunia, female 04/24/2020   Diarrhea 03/12/2020   Toe pain, chronic, left 02/28/2020   GAD (generalized anxiety disorder) 01/02/2020   Alcohol abuse 01/02/2020   Palpitations 04/03/2019   Seasonal allergies 01/16/2019   Elevated LFTs 11/15/2018   Spongiotic dermatitis 07/08/2018    Hyperlipidemia 07/08/2018   Asthma 08/20/2017   Preventative health care 06/21/2017   Insomnia 04/16/2017   Essential hypertension 04/16/2017    ONSET DATE: DOS 09/17/23  REFERRING DIAG: D37.609J (ICD-10-CM) - Closed displaced fracture of other part of second metacarpal bone of right hand, initial encounter   THERAPY DIAG:  Muscle weakness (generalized)  Localized edema  Stiffness of right hand, not elsewhere classified  Other lack of coordination  Pain in right hand  Rationale for Evaluation and Treatment: Rehabilitation  PERTINENT HISTORY:   past Rt clavicle fx, past Rt hand fx (>3 years ago) She works @ [employer].   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   PRECAUTIONS: None  RED FLAGS: None   WEIGHT BEARING RESTRICTIONS: Yes nonweightbearing in right hand for at least 2-4 more weeks   SUBJECTIVE:   SUBJECTIVE STATEMENT: 9+ weeks status post right index finger metacarpal head fracture ORIF. She states using her hand a lot in the past week, had new swelling, and also now she has a lot of pain. She also has been using a squeeze ball though told to do no repetitive strengthening yet.     PAIN:  Are you having pain? Yes: NPRS scale:  6-7/10 at rest Pain location: Rt hand IF Pain description: Aching and sometimes sharp Aggravating factors: Attempted motion Relieving factors: Rest and immobilization   PATIENT GOALS: Improve use of right dominant hand  NEXT MD VISIT: As needed   OBJECTIVE: (All objective assessments below are from initial evaluation on: 10/21/23 unless otherwise specified.)   HAND DOMINANCE: Right   ADLs: Overall ADLs: States decreased ability to grab, hold household objects, pain and difficulty to open containers, perform FMS tasks (manipulate fasteners on clothing), mild to moderate bathing problems as well.    FUNCTIONAL OUTCOME MEASURES: Eval: Quick DASH 88% impairment today  (Higher % Score  =  More Impairment)     UPPER EXTREMITY ROM     Shoulder to Wrist AROM Right eval Rt 11/01/23 Rt 11/08/23 Rt 11/22/23  Forearm supination 80     Forearm pronation  84     Wrist flexion 24 52 56 58  Wrist extension 32 70 72 75  Wrist ulnar deviation      Wrist radial deviation      Functional dart thrower's motion (F-DTM) in ulnar flexion      F-DTM in radial extension       (Blank rows = not tested)   Hand AROM Right eval Rt 11/01/23 Rt 11/08/23 Rt 11/22/23  Full Fist Ability (or Gap to Distal Palmar Crease) Unable, 8.8cm gap from tip IF to [facility] 6.8cm gap now from IF to [facility] 3.2cm gap now    Thumb Opposition  (Kapandji Scale)  1/10 8/10  Can tough IF now easily     Thumb MCP (0-60) 0-15 0-59    Thumb IP (0-80) 0-25 0-33  Index MCP (0-90) (-18)  - 23 (-28) - 31 (-28) - 40 (-23) - 29  Index PIP (0-100) (-22) - 35 (- 24) - 64 (-22) - 79 (-14) -  80  Index DIP (0-70) (-4) - 0 0- 41 0- 60 0 - 56  (Blank rows = not tested)   UPPER EXTREMITY MMT:    Eval:  NT at eval due to recent and still healing injuries. Will be tested when appropriate.   MMT Right TBD  Elbow flexion   Elbow extension   Forearm supination   Forearm pronation   Wrist flexion   Wrist extension   Wrist ulnar deviation   Wrist radial deviation   (Blank rows = not tested)  HAND FUNCTION: Eval: Observed weakness in affected Rt hand.  Details will be tested when safe Grip strength Right: NT lbs, Left: 51 lbs   COORDINATION: Eval: Observed coordination impairments with affected right hand.  Details will be tested in the next 1-2 sessions as able   SENSATION: Eval:  Light touch diminished around sx area and around the thumb and index finger volarly and dorsally today deep pressure and temperature sense intact  EDEMA:   11/07/33: none now   OBSERVATIONS:   11/08/23:  now no longer very hypersensitive, MCP joint is stiffest, but all is improving.   Eval: She has some hypersensitivity and anxiety for which she flinches her hand  away when OT lightly touches her skin.  This does improve by the end of the session.  MP joint is very tight and whole finger and thumb seems to have some inhibited motion from pain and also may be some nerve compression from swelling  Right index finger metacarpal fracture status post ORIF   TODAY'S TREATMENT:  11/22/23: She returns in more pain, likely not doing HEP correctly and doing too much too soon to fast including squeezing a ball which was not educated on any strengthening yet.  OT educates her to slow down start doing long, slow stretches that are nonpainful.  She should try to avoid guarding and resisting her stretches, and she should not be doing any squeezing of stress balls yet.  As she is working more and even working overtime, she is already putting a lot of stress through her hand and we will not start strengthening today.  Especially due to her higher pains.   OT puts her on moist heat for approximately 8 minutes while adjusting her dynamic orthosis which she states did break loose in several places and need adjustment.  After moist heat, she feels a bit looser and less pain, and OT reviews and performs her home exercises with her including stretches at the MCP, IPJ's, and in composite flexion.  After this, her active range of motion measures were retaken and improved at each joint by approximately 5 degrees.  That is a 15 degree improvement after just several stretches right now.  OT tries to use this to encourage her to keep on her stretch routine consistently and without resisting and pain as she is very stiff today and shows behaviors of resisting quite a bit.  She was told to ditch the squeezy ball and simply do the exercises that I have shown her, and use the dynamic flexion orthosis that was made for her and adjusted today.  She was also encouraged to keep her activity at home and work lighter and nonpainful or else she will continue to have flareups and pain most  likely.   PATIENT  EDUCATION: Education details: See tx section above for details  Person educated: Patient Education method: Verbal Instruction, Teach back, Handouts  Education comprehension: States and demonstrates understanding, Additional Education required    HOME EXERCISE PROGRAM: Access Code: [email or web address] URL: [email or web address] Date: 10/21/2023 Prepared by: Melvenia Ada   GOALS: Goals reviewed with patient? Yes   SHORT TERM GOALS: (STG required if POC>30 days) Target Date: 11/05/2023  Pt will obtain protective, custom orthotic. Goal status: 10/21/2023 MET   2.  Pt will demo/state understanding of initial HEP to improve pain levels and prerequisite motion. Goal status: 11/22/2023: Partially met-she has been doing other exercises that were not educated on but she states understanding HEP that OT gave   LONG TERM GOALS: Target Date: 12/03/2023  Pt will improve functional ability by decreased impairment per Quick DASH assessment from 88% to 25% or better, for better quality of life. Goal status: INITIAL  2.  Pt will improve grip strength in Rt hand from NT to at least 35lbs for functional use at home and in IADLs. Goal status: INITIAL  3.  Pt will improve A/ROM in right index finger total active motion from 14 degrees to at least 200 degrees, to have functional motion for tasks like reach and grasp.  Goal status: INITIAL  4.  Pt will improve strength in right wrist flexion/extension from apparent 3/5 MMT to at least 4+/5 MMT to have increased functional ability to carry out selfcare and higher-level homecare tasks with less difficulty. Goal status: INITIAL  5.  Pt will improve coordination skills in right dominant hand, as seen by within functional limit score on nine-hole peg testing to have increased functional ability to carry out fine motor tasks (fasteners, etc.) and more complex, coordinated IADLs (meal prep, sports, etc.).  Goal status:  INITIAL  6.  Pt will decrease pain at worst from 10/10 to 3/10 or better to have better sleep and occupational participation in daily roles. Goal status: INITIAL   ASSESSMENT:  CLINICAL IMPRESSION: 11/22/23: It does not seem coincidental that she has been out of her orthosis, missed a week of therapy, working more, trying to squeeze squeeze balls, and having more pain and more stiffness now.  OT cautioned her not to go to too fast and to stick with the plan that OT has given her.  We will upgrade to light hand strengthening next visit if her pain is lower and her mobility is better.   PLAN:  OT FREQUENCY: 1-2x/week  OT DURATION: 6 weeks through 12/03/2023 and up to 10 total visits as needed  PLANNED INTERVENTIONS: 97168 OT Re-evaluation, 97535 self care/ADL training, 02889 therapeutic exercise, 97530 therapeutic activity, 97112 neuromuscular re-education, 97140 manual therapy, 97035 ultrasound, 97039 fluidotherapy, 97010 moist heat, 97010 cryotherapy, 97034 contrast bath, 97760 Orthotics management and training, 02239 Splinting (initial encounter), [id number] Subsequent splinting/medication, scar mobilization, compression bandaging, Dry needling, energy conservation, coping strategies training, and patient/family education  CONSULTED AND AGREED WITH PLAN OF CARE: Patient and family member/caregiver  PLAN FOR NEXT SESSION:   Check motion, pain, irritability, and if these things are all improved-start light hand strength with therapy putty.   Melvenia Ada, OTR/L, CHT 11/22/2023, 12:23 PM

## 2023-11-22 ENCOUNTER — Encounter: Payer: Self-pay | Admitting: Rehabilitative and Restorative Service Providers"

## 2023-11-22 ENCOUNTER — Ambulatory Visit (INDEPENDENT_AMBULATORY_CARE_PROVIDER_SITE_OTHER): Payer: No Typology Code available for payment source | Admitting: Rehabilitative and Restorative Service Providers"

## 2023-11-22 DIAGNOSIS — R600 Localized edema: Secondary | ICD-10-CM

## 2023-11-22 DIAGNOSIS — R278 Other lack of coordination: Secondary | ICD-10-CM

## 2023-11-22 DIAGNOSIS — M25641 Stiffness of right hand, not elsewhere classified: Secondary | ICD-10-CM

## 2023-11-22 DIAGNOSIS — M6281 Muscle weakness (generalized): Secondary | ICD-10-CM

## 2023-11-22 DIAGNOSIS — M79641 Pain in right hand: Secondary | ICD-10-CM

## 2023-11-29 ENCOUNTER — Encounter: Payer: No Typology Code available for payment source | Admitting: Rehabilitative and Restorative Service Providers"

## 2023-12-02 ENCOUNTER — Ambulatory Visit: Admitting: Primary Care

## 2023-12-20 ENCOUNTER — Encounter: Admitting: Orthopedic Surgery

## 2024-01-06 ENCOUNTER — Ambulatory Visit: Admitting: Primary Care

## 2024-03-16 DIAGNOSIS — I4581 Long QT syndrome: Secondary | ICD-10-CM | POA: Diagnosis not present

## 2024-03-16 DIAGNOSIS — I493 Ventricular premature depolarization: Secondary | ICD-10-CM | POA: Diagnosis not present

## 2024-03-17 DIAGNOSIS — I4581 Long QT syndrome: Secondary | ICD-10-CM | POA: Diagnosis not present

## 2024-03-18 DIAGNOSIS — I517 Cardiomegaly: Secondary | ICD-10-CM | POA: Diagnosis not present

## 2024-03-18 DIAGNOSIS — I4581 Long QT syndrome: Secondary | ICD-10-CM | POA: Diagnosis not present

## 2024-03-21 ENCOUNTER — Telehealth: Payer: Self-pay

## 2024-03-21 NOTE — Telephone Encounter (Signed)
 We will need some updated office notes for this request. Does she have access to her phone?  Please call her and let her know that I will be sending some questions through her MyChart portal. She will need to respond.

## 2024-03-21 NOTE — Telephone Encounter (Signed)
 Received Short term disability claim form.  Placed in providers box for review.

## 2024-03-22 ENCOUNTER — Encounter: Payer: Self-pay | Admitting: Internal Medicine

## 2024-03-22 NOTE — Telephone Encounter (Signed)
 Unable to reach patient. Left voicemail to return call to our office.

## 2024-03-23 NOTE — Telephone Encounter (Signed)
 Unable to reach patient. Left voicemail to return call to our office.

## 2024-03-24 NOTE — Telephone Encounter (Signed)
 Called and spoke with Kelly Walton, patients fiance (per dpr) he was able to answer questions needed for short term disability PPW.  -Pt was admitted to rehab facility on 03/19/24 -Tentative return to work date- (3 months) 06/19/24 -Pt has not been hospitalized for alcohol abuse -Pt has not had any surgeries related  -Pt has seen psychiatry specialty in the past that [place] referred her to, but Kelly Walton stated that has been a while   These are the only things I could tell from the ppw that were needed. If anything else is needed Kelly Walton will try to help answer.   PPW placed back in your inbox for review and completion

## 2024-03-24 NOTE — Telephone Encounter (Signed)
 Unable to reach patient. Left voicemail to return call to our office.   3rd attempt to reach patient.

## 2024-03-24 NOTE — Telephone Encounter (Signed)
 Called and spoke with patients fiance, Alm Nageotte. She is in [place]  rehab facility, the timeline is anywhere between 30 days and 3 months based on progress.  Alm states she does not have regular access to her phone, she only get one 15 minute phone call out per day.  Alm stated if we were able to have our questions ready he can have patient call the office on one of her daily calls to answer what is needed.

## 2024-03-24 NOTE — Telephone Encounter (Addendum)
 Can we call her fiance Alm Nageotte? Find out if she is in [place] . When she will be discharged.  Let him know what she sent in her message regarding paperwork.

## 2024-03-24 NOTE — Telephone Encounter (Signed)
 We need the date she was admitted and a tentative return to work date. The form is in my inbox to review for additional answers we may need.

## 2024-03-28 NOTE — Telephone Encounter (Signed)
 Completed forms and placed on Erin's desk.

## 2024-03-28 NOTE — Telephone Encounter (Signed)
 Completed forms received and faxed to [phone] Copy sent to scan. Copy mailed to patient at the address on file.

## 2024-06-01 ENCOUNTER — Ambulatory Visit (INDEPENDENT_AMBULATORY_CARE_PROVIDER_SITE_OTHER): Admitting: Primary Care

## 2024-06-01 ENCOUNTER — Encounter: Payer: Self-pay | Admitting: Primary Care

## 2024-06-01 VITALS — BP 116/78 | HR 95 | Temp 97.9°F | Ht 63.25 in | Wt 109.5 lb

## 2024-06-01 DIAGNOSIS — I10 Essential (primary) hypertension: Secondary | ICD-10-CM

## 2024-06-01 DIAGNOSIS — K429 Umbilical hernia without obstruction or gangrene: Secondary | ICD-10-CM

## 2024-06-01 DIAGNOSIS — R7989 Other specified abnormal findings of blood chemistry: Secondary | ICD-10-CM

## 2024-06-01 DIAGNOSIS — Z1231 Encounter for screening mammogram for malignant neoplasm of breast: Secondary | ICD-10-CM

## 2024-06-01 DIAGNOSIS — E785 Hyperlipidemia, unspecified: Secondary | ICD-10-CM

## 2024-06-01 DIAGNOSIS — Z0001 Encounter for general adult medical examination with abnormal findings: Secondary | ICD-10-CM | POA: Diagnosis not present

## 2024-06-01 DIAGNOSIS — G2581 Restless legs syndrome: Secondary | ICD-10-CM

## 2024-06-01 DIAGNOSIS — J302 Other seasonal allergic rhinitis: Secondary | ICD-10-CM | POA: Diagnosis not present

## 2024-06-01 DIAGNOSIS — J4520 Mild intermittent asthma, uncomplicated: Secondary | ICD-10-CM | POA: Diagnosis not present

## 2024-06-01 DIAGNOSIS — F1011 Alcohol abuse, in remission: Secondary | ICD-10-CM | POA: Diagnosis not present

## 2024-06-01 DIAGNOSIS — F411 Generalized anxiety disorder: Secondary | ICD-10-CM

## 2024-06-01 DIAGNOSIS — G4700 Insomnia, unspecified: Secondary | ICD-10-CM

## 2024-06-01 DIAGNOSIS — K219 Gastro-esophageal reflux disease without esophagitis: Secondary | ICD-10-CM

## 2024-06-01 DIAGNOSIS — R140 Abdominal distension (gaseous): Secondary | ICD-10-CM | POA: Diagnosis not present

## 2024-06-01 LAB — CBC
HCT: 31.8 % — ABNORMAL LOW (ref 36.0–46.0)
Hemoglobin: 10.5 g/dL — ABNORMAL LOW (ref 12.0–15.0)
MCHC: 32.9 g/dL (ref 30.0–36.0)
MCV: 88.9 fl (ref 78.0–100.0)
Platelets: 224 K/uL (ref 150.0–400.0)
RBC: 3.58 Mil/uL — ABNORMAL LOW (ref 3.87–5.11)
RDW: 14 % (ref 11.5–15.5)
WBC: 3.9 K/uL — ABNORMAL LOW (ref 4.0–10.5)

## 2024-06-01 LAB — COMPREHENSIVE METABOLIC PANEL WITH GFR
ALT: 18 U/L (ref 0–35)
AST: 22 U/L (ref 0–37)
Albumin: 4.1 g/dL (ref 3.5–5.2)
Alkaline Phosphatase: 84 U/L (ref 39–117)
BUN: 20 mg/dL (ref 6–23)
CO2: 33 meq/L — ABNORMAL HIGH (ref 19–32)
Calcium: 9.4 mg/dL (ref 8.4–10.5)
Chloride: 98 meq/L (ref 96–112)
Creatinine, Ser: 0.79 mg/dL (ref 0.40–1.20)
GFR: 86.11 mL/min (ref >60.00)
Glucose, Bld: 93 mg/dL (ref 70–99)
Potassium: 4.4 meq/L (ref 3.5–5.1)
Sodium: 139 meq/L (ref 135–145)
Total Bilirubin: 0.3 mg/dL (ref 0.2–1.2)
Total Protein: 6.4 g/dL (ref 6.0–8.3)

## 2024-06-01 LAB — LIPID PANEL
Cholesterol: 217 mg/dL — ABNORMAL HIGH (ref 0–200)
HDL: 72.5 mg/dL (ref >39.00)
LDL Cholesterol: 105 mg/dL — ABNORMAL HIGH (ref 0–99)
NonHDL: 144.09
Total CHOL/HDL Ratio: 3
Triglycerides: 195 mg/dL — ABNORMAL HIGH (ref 0.0–149.0)
VLDL: 39 mg/dL (ref 0.0–40.0)

## 2024-06-01 LAB — VITAMIN B12: Vitamin B-12: 449 pg/mL (ref 211–911)

## 2024-06-01 LAB — FOLATE: Folate: >22.9 ng/mL (ref >5.9)

## 2024-06-01 LAB — HEMOGLOBIN A1C: Hgb A1c MFr Bld: 5.7 % (ref 4.6–6.5)

## 2024-06-01 NOTE — Addendum Note (Signed)
 Addended by: SEBASTIAN DANNA GRADE on: 06/01/2024 02:07 PM   Modules accepted: Orders

## 2024-06-01 NOTE — Assessment & Plan Note (Signed)
 Commended her on her sobriety! Repeat liver enzymes pending

## 2024-06-01 NOTE — Assessment & Plan Note (Signed)
 Noted on exam today.  Since the site of concern is enlarging we will obtain an updated abdominal ultrasound.  She agrees.

## 2024-06-01 NOTE — Assessment & Plan Note (Signed)
 Unclear etiology. This could be from the protein drinks for which she will discontinue.  If no improvement we discussed probiotics. Will obtain abdominal ultrasound for further evaluation.

## 2024-06-01 NOTE — Assessment & Plan Note (Signed)
 Controlled. Continue amlodipine 5 mg daily.

## 2024-06-01 NOTE — Progress Notes (Signed)
 Subjective:    Patient ID: Kelly Walton, female    DOB: 10/22/1970, 53 y.o.   MRN: [id number]  Kelly Walton is a very pleasant 53 y.o. female who presents today for complete physical and follow up of chronic conditions.  She recently returned back from inpatient rehab for alcohol abuse.  She spent 30 days in [place]  and another 3 days in [place] .  She has been home for about 10 days now.  She is now 75 days sober.  She is connected with alcoholic Anonymous and has a sponsor.  She is currently managed on several new medications such as Seroquel 100 mg HS, Requip 0.5 mg HS for restless legs, methocarbamol 500 mg, clonidine  0.1 mg.  Her cravings have reduced significantly.  She would like to see a therapist.  She has a known periumbilical hernia to the left which was identified in December 2023.  Over the years she has noted the spot has increased in size.  She denies pain and the hernia does not bother her.  She is concerned about the growth.  She is also noticed abdominal bloating for the last several weeks.  Initially she was on 3 protein shakes a day so she has weaned herself down to 1 protein shake a day and has not noticed a difference.  She does have a history of GI bleed, was admitted to [facility] prior to her inpatient rehab.  Immunizations: -Tetanus: Completed in 2016 -Influenza: Declines influenza vaccine.  -Shingles: Never completed -Pneumonia: Completed in 2016  Diet: Fair diet.  Exercise: Regular exercise.  Eye exam: Completes annually  Dental exam: Completes semi-annually    Pap Smear: Hysterectomy  Mammogram: Completed in January 2024  Colonoscopy: Completed in 2023, due 2033  BP Readings from Last 3 Encounters:  06/01/24 116/78  09/17/23 (!) 142/99  06/29/23 122/82   Wt Readings from Last 3 Encounters:  06/01/24 109 lb 8 oz (49.7 kg)  09/17/23 89 lb 15.2 oz (40.8 kg)  08/30/23 90 lb (40.8 kg)         Review of Systems  Constitutional:  Negative  for unexpected weight change.  HENT:  Negative for rhinorrhea.   Respiratory:  Negative for cough and shortness of breath.   Cardiovascular:  Negative for chest pain.  Gastrointestinal:  Positive for abdominal distention. Negative for constipation and diarrhea.       Abdominal bloating. Enlarging periumbilical hernia  Genitourinary:  Negative for difficulty urinating.  Musculoskeletal:  Negative for arthralgias and myalgias.  Skin:  Negative for rash.  Allergic/Immunologic: Negative for environmental allergies.  Neurological:  Negative for dizziness, numbness and headaches.         Past Medical History:  Diagnosis Date   AC separation, right, initial encounter    Acute conjunctivitis of left eye 12/27/2020   Acute right lower quadrant pain 06/10/2018   Alcohol abuse    Anxiety    Arthritis    Asthma    Closed fracture of head of metacarpal bone 09/17/2023   Hand injury, right, initial encounter 09/26/2019   History of fracture of clavicle 06/11/2020   Hypertension    Insomnia    Insomnia    Pain of upper abdomen 06/21/2017   Palpitations    a. 05/2019 Echo: EF 60-65%, diast dysfxn; b. 05/2019 Zio: Sinus rhythm-sinus tach. Avg HR 105. 6 SVT episodes - longest 7 beats. Rare PVCs-->Diltiazem  added.   Periumbilical abdominal pain 03/12/2020   PONV (postoperative nausea and vomiting)    Spongiotic  dermatitis     Social History   Socioeconomic History   Marital status: Significant Other    Spouse name: Alm   Number of children: 2   Years of education: !2   Highest education level: GED or equivalent  Occupational History   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 Pharm Tech  Tobacco Use   Smoking status: Never   Smokeless tobacco: Never  Vaping Use   Vaping status: Never Used  Substance and Sexual Activity   Alcohol use: Yes    Comment: once a week-alcohol drink   Drug use: No   Sexual activity: Yes    Birth  control/protection: Surgical    Comment: hyst  Other Topics Concern   Not on file  Social History Narrative   Long term 14 year relationship   2 children, 1 step-son.   Lives in [place] with significant other, mother, and son.   Enjoys going to beach, spending time with family.    Social Drivers of Corporate investment banker Strain: Not on file  Food Insecurity: No Food Insecurity (08/30/2023)   Hunger Vital Sign    Worried About Running Out of Food in the Last Year: Never true    Ran Out of Food in the Last Year: Never true  Transportation Needs: Not on file  Physical Activity: Not on file  Stress: Not on file  Social Connections: Unknown (01/17/2022)   [facility]   Social Network    Social Network: Not on file  Intimate Partner Violence: Unknown (12/10/2021)   Received from [facility]   HITS    Physically Hurt: Not on file    Insult or Talk Down To: Not on file    Threaten Physical Harm: Not on file    Scream or Curse: Not on file    Past Surgical History:  Procedure Laterality Date   ABDOMINAL HYSTERECTOMY     CESAREAN SECTION     FOOT FRACTURE SURGERY Right    OPEN REDUCTION INTERNAL FIXATION (ORIF) METACARPAL Right 09/17/2023   Procedure: RIGHT INDEX FINGER METACARPAL HEAD FRACTURE OPEN REDUCTION INTERNAL FIXATION (ORIF) METACARPAL;  Surgeon: Arlinda Buster, MD;  Location: [facility];  Service: Orthopedics;  Laterality: Right;   ORIF CLAVICULAR FRACTURE Right 06/17/2020   Procedure: OPEN REDUCTION INTERNAL FIXATION (ORIF) RIGHT CLAVICLE FRACTURE, ACROMIOCLAVICULAR SEPARATION;  Surgeon: Jerri Kay HERO, MD;  Location: [facility];  Service: Orthopedics;  Laterality: Right;   ORIF CLAVICULAR FRACTURE Right 07/05/2020   Procedure: REVISION OPEN REDUCTION INTERNAL FIXATION (ORIF) RIGHT CLAVICLE FRACTURE, ACROMIOCLAVICULAR SEPARATION;  Surgeon: Jerri Kay HERO, MD;  Location[facility];  Service: Orthopedics;  Laterality: Right;    PERCUTANEOUS PINNING PHALANX FRACTURE OF HAND Right    ROBOTIC ASSISTED TOTAL HYSTERECTOMY N/A 03/05/2014   Procedure: ROBOTIC ASSISTED TOTAL HYSTERECTOMY/BILATERAL SALPINGECTOMY;  Surgeon: Dickie DELENA Carder, MD;  Location: [facility];  Service: Gynecology;  Laterality: N/A;   TUBAL LIGATION      Family History  Problem Relation Age of Onset   Arthritis Mother    Kidney disease Mother    Alcohol abuse Father    Arthritis Father    Lung cancer Father    Heart attack Father    Lymphoma Father    Diabetes Father    Epilepsy Brother    Breast cancer Maternal Aunt    Hyperlipidemia Maternal Grandmother    Stroke Maternal Grandmother    Hypertension Maternal Grandmother  Heart attack Maternal Grandmother    Diabetes Maternal Grandmother    Alcohol abuse Paternal Grandmother    Arthritis Paternal Grandmother    Mental illness Paternal Grandmother    Lupus Son    Colon cancer Neg Hx    Esophageal cancer Neg Hx    Stomach cancer Neg Hx    Rectal cancer Neg Hx     Allergies  Allergen Reactions   Phenergan [Promethazine Hcl] Hives and Rash   Sulfa Antibiotics Hives and Rash    Pt states sent her to the hospital.   Trazodone  And Nefazodone     Hallucinations     Current Outpatient Medications on File Prior to Visit  Medication Sig Dispense Refill   albuterol  (VENTOLIN  HFA) 108 (90 Base) MCG/ACT inhaler TAKE 2 PUFFS BY MOUTH EVERY 6 HOURS AS NEEDED FOR WHEEZE OR SHORTNESS OF BREATH 6.7 each 0   amLODipine  (NORVASC ) 5 MG tablet TAKE 1 TABLET BY MOUTH EVERY DAY FOR BLOOD PRESSURE 90 tablet 2   Biotin w/ Vitamins C & E (HAIR SKIN & NAILS GUMMIES PO) Take by mouth.     cloNIDine  (CATAPRES ) 0.1 MG tablet Take 0.1 mg by mouth 3 (three) times daily.     DULoxetine (CYMBALTA) 60 MG capsule Take 60 mg by mouth daily.     hydrOXYzine  (ATARAX ) 10 MG tablet Take 1 tablet (10 mg total) by mouth at bedtime as needed for anxiety. 90 tablet 1   methocarbamol (ROBAXIN) 500 MG tablet Take 500 mg  by mouth 2 (two) times daily.     montelukast  (SINGULAIR ) 10 MG tablet TAKE 1 TABLET BY MOUTH AT BEDTIME FOR ALLERGIES/ASTHMA 90 tablet 1   QUEtiapine (SEROQUEL) 100 MG tablet Take 100 mg by mouth at bedtime.     rOPINIRole (REQUIP) 0.5 MG tablet Take by mouth daily.     No current facility-administered medications on file prior to visit.    BP 116/78   Pulse 95   Temp 97.9 F (36.6 C) (Oral)   Ht 5' 3.25 (1.607 m)   Wt 109 lb 8 oz (49.7 kg)   LMP 03/05/2014 Comment: LMP x 66yrs ago   SpO2 96%   BMI 19.24 kg/m  Objective:   Physical Exam HENT:     Right Ear: Tympanic membrane and ear canal normal.     Left Ear: Tympanic membrane and ear canal normal.  Eyes:     Pupils: Pupils are equal, round, and reactive to light.  Cardiovascular:     Rate and Rhythm: Normal rate and regular rhythm.  Pulmonary:     Effort: Pulmonary effort is normal.     Breath sounds: Normal breath sounds.  Abdominal:     General: Bowel sounds are normal.     Palpations: Abdomen is soft.     Tenderness: There is no abdominal tenderness.      Comments: Approximately 4 cm x 1.5 cm soft, immobile superficial mass to left upper umbilical region. Non tender.  Mild generalized abdominal distension, soft.   Musculoskeletal:        General: Normal range of motion.     Cervical back: Neck supple.  Skin:    General: Skin is warm and dry.  Neurological:     Mental Status: She is alert and oriented to person, place, and time.     Cranial Nerves: No cranial nerve deficit.     Deep Tendon Reflexes:     Reflex Scores:      Patellar reflexes are 2+ on  the right side and 2+ on the left side. Psychiatric:        Mood and Affect: Mood normal.     Physical Exam        Assessment & Plan:  Encounter for annual general medical examination with abnormal findings in adult Assessment & Plan: Declines influenza vaccine.  [facility] Mammogram due, orders  placed. Colonoscopy UTD, due 2033  Discussed the importance of a healthy diet and regular exercise in order for weight loss, and to reduce the risk of further co-morbidity.  Exam stable. Labs pending.  Follow up in 1 year for repeat physical.    Essential hypertension Assessment & Plan: Controlled.  Continue amlodipine  5 mg daily.  Orders: -     CBC -     Comprehensive metabolic panel with GFR -     Hemoglobin A1c  Mild intermittent asthma without complication Assessment & Plan: Controlled.  Continue Singulair  10 mg HS, albuterol  inhaler as needed.   Gastroesophageal reflux disease, unspecified whether esophagitis present Assessment & Plan: Controlled.  Remain off treatment.   Seasonal allergies Assessment & Plan: Stable.  Continue Singulair  10 mg at bedtime   Periumbilical hernia Assessment & Plan: Noted on exam today.  Since the site of concern is enlarging we will obtain an updated abdominal ultrasound.  She agrees.  Orders: -     US  Abdomen Complete; Future  Insomnia, unspecified type Assessment & Plan: Controlled.  Continue Seroquel 100 mg at bedtime and hydroxyzine  10 mg as needed.   Hyperlipidemia, unspecified hyperlipidemia type Assessment & Plan: Repeat lipid panel pending.  Orders: -     Lipid panel -     Hemoglobin A1c  GAD (generalized anxiety disorder) Assessment & Plan: Improving.  Continue duloxetine 60 mg daily.  Orders: -     Ambulatory referral to Psychology  Elevated LFTs Assessment & Plan: Commended her on her sobriety! Repeat liver enzymes pending   Alcohol abuse, in remission Assessment & Plan: Commended her on her sobriety!  Continue AA meetings and follow-up with her sponsor. Referral placed to therapy.  Continue clonidine  0.1 mg daily, Seroquel 100 mg at bedtime, Requip 0.5 mg at bedtime. She will attempt to wean off of some of these medications when ready.  Orders: -     Folate -     Vitamin  B12  Restless legs Assessment & Plan: Initiated inpatient rehab.  Continue Requip 0.5 mg nightly for now. Goal is to try to wean off in the future.   Abdominal bloating Assessment & Plan: Unclear etiology. This could be from the protein drinks for which she will discontinue.  If no improvement we discussed probiotics. Will obtain abdominal ultrasound for further evaluation.   Orders: -     US  Abdomen Complete; Future    Assessment and Plan Assessment & Plan         Comer MARLA Gaskins, NP    Discussed the use of AI scribe software for clinical note transcription with the patient, who gave verbal consent to proceed.  History of Present Illness

## 2024-06-01 NOTE — Patient Instructions (Addendum)
 Stop by the lab prior to leaving today. I will notify you of your results once received.   You will receive a phone call regarding the abdominal ultrasound.  You will either be contacted via phone regarding your referral to therapy, or you may receive a letter on your MyChart portal from our referral team with instructions for scheduling an appointment. Please let us  know if you have not been contacted by anyone within two weeks.  [facility].  It was a pleasure to see you today!

## 2024-06-01 NOTE — Assessment & Plan Note (Signed)
Controlled. Remain off treatment.

## 2024-06-01 NOTE — Assessment & Plan Note (Signed)
 Controlled.  Continue Singulair  10 mg HS, albuterol  inhaler as needed.

## 2024-06-01 NOTE — Assessment & Plan Note (Signed)
Improving.  Continue duloxetine 60 mg daily.

## 2024-06-01 NOTE — Assessment & Plan Note (Signed)
 Initiated inpatient rehab.  Continue Requip 0.5 mg nightly for now. Goal is to try to wean off in the future.

## 2024-06-01 NOTE — Assessment & Plan Note (Signed)
 Declines influenza vaccine.  She will [facility], orders placed. Colonoscopy UTD, due 2033  Discussed the importance of a healthy diet and regular exercise in order for weight loss, and to reduce the risk of further co-morbidity.  Exam stable. Labs pending.  Follow up in 1 year for repeat physical.

## 2024-06-01 NOTE — Assessment & Plan Note (Signed)
 Commended her on her sobriety!  Continue AA meetings and follow-up with her sponsor. Referral placed to therapy.  Continue clonidine  0.1 mg daily, Seroquel 100 mg at bedtime, Requip 0.5 mg at bedtime. She will attempt to wean off of some of these medications when ready.

## 2024-06-01 NOTE — Assessment & Plan Note (Signed)
Stable.  Continue Singulair 10 mg at bedtime.

## 2024-06-01 NOTE — Assessment & Plan Note (Signed)
 Repeat lipid panel pending.

## 2024-06-01 NOTE — Assessment & Plan Note (Signed)
 Controlled.  Continue Seroquel 100 mg at bedtime and hydroxyzine  10 mg as needed.

## 2024-06-02 ENCOUNTER — Ambulatory Visit: Payer: Self-pay | Admitting: Primary Care

## 2024-06-02 DIAGNOSIS — K429 Umbilical hernia without obstruction or gangrene: Secondary | ICD-10-CM

## 2024-06-07 ENCOUNTER — Telehealth: Payer: Self-pay

## 2024-06-07 ENCOUNTER — Encounter (HOSPITAL_BASED_OUTPATIENT_CLINIC_OR_DEPARTMENT_OTHER): Payer: Self-pay | Admitting: Radiology

## 2024-06-07 ENCOUNTER — Inpatient Hospital Stay (HOSPITAL_BASED_OUTPATIENT_CLINIC_OR_DEPARTMENT_OTHER)
Admission: RE | Admit: 2024-06-07 | Discharge: 2024-06-07 | Disposition: A | Source: Ambulatory Visit | Attending: Primary Care | Admitting: Radiology

## 2024-06-07 ENCOUNTER — Ambulatory Visit (INDEPENDENT_AMBULATORY_CARE_PROVIDER_SITE_OTHER)
Admission: RE | Admit: 2024-06-07 | Discharge: 2024-06-07 | Disposition: A | Discharge status: Home / Self Care | Source: Ambulatory Visit | Attending: Primary Care | Admitting: Primary Care

## 2024-06-07 DIAGNOSIS — Z1231 Encounter for screening mammogram for malignant neoplasm of breast: Secondary | ICD-10-CM

## 2024-06-07 DIAGNOSIS — K429 Umbilical hernia without obstruction or gangrene: Secondary | ICD-10-CM

## 2024-06-07 DIAGNOSIS — R140 Abdominal distension (gaseous): Secondary | ICD-10-CM | POA: Diagnosis not present

## 2024-06-07 NOTE — Telephone Encounter (Deleted)
 Copied from CRM [id number]. Topic: Clinical - Request for Lab/Test Order >> Jun 07, 2024  9:22 AM Carlatta H wrote: Reason for CRM: Kate from [facility] called to get confirmation on imaging orders//Please call back to advise if its complete or limited imaging

## 2024-06-07 NOTE — Telephone Encounter (Signed)
 ERROR

## 2024-06-09 ENCOUNTER — Encounter

## 2024-06-20 ENCOUNTER — Other Ambulatory Visit: Payer: Self-pay | Admitting: Primary Care

## 2024-06-20 ENCOUNTER — Encounter: Admitting: Primary Care

## 2024-06-20 DIAGNOSIS — J4520 Mild intermittent asthma, uncomplicated: Secondary | ICD-10-CM

## 2024-06-21 ENCOUNTER — Other Ambulatory Visit: Payer: Self-pay

## 2024-06-21 ENCOUNTER — Other Ambulatory Visit (INDEPENDENT_AMBULATORY_CARE_PROVIDER_SITE_OTHER): Payer: Self-pay

## 2024-06-21 ENCOUNTER — Ambulatory Visit (INDEPENDENT_AMBULATORY_CARE_PROVIDER_SITE_OTHER): Admitting: Physician Assistant

## 2024-06-21 DIAGNOSIS — M545 Low back pain, unspecified: Secondary | ICD-10-CM

## 2024-06-21 DIAGNOSIS — M25561 Pain in right knee: Secondary | ICD-10-CM

## 2024-06-21 DIAGNOSIS — G8929 Other chronic pain: Secondary | ICD-10-CM

## 2024-06-21 DIAGNOSIS — M25562 Pain in left knee: Secondary | ICD-10-CM | POA: Diagnosis not present

## 2024-06-21 MED ORDER — METHYLPREDNISOLONE ACETATE 40 MG/ML IJ SUSP
13.3300 mg | INTRAMUSCULAR | Status: AC | PRN
  Administered 2024-06-21: 13.33 mg via INTRA_ARTICULAR

## 2024-06-21 MED ORDER — BUPIVACAINE HCL 0.25 % IJ SOLN
0.6600 mL | INTRAMUSCULAR | Status: AC | PRN
  Administered 2024-06-21: .66 mL via INTRA_ARTICULAR

## 2024-06-21 MED ORDER — LIDOCAINE HCL 1 % IJ SOLN
3.0000 mL | INTRAMUSCULAR | Status: AC | PRN
  Administered 2024-06-21: 3 mL

## 2024-06-21 NOTE — Progress Notes (Signed)
 Office Visit Note   Patient: Kelly Walton           Date of Birth: 02-05-1971           MRN: [id number] Visit Date: 06/21/2024              Requested by: Gretta Comer POUR, NP [address] [place],  [place] [address] PCP: Gretta Comer POUR, NP   Assessment & Plan: Visit Diagnoses:  1. Chronic pain of both knees   2. Low back pain, unspecified back pain laterality, unspecified chronicity, unspecified whether sciatica present     Plan: Impression is low back and bilateral knee pain.  I think a lot of this pain is likely the result of becoming more active after a long period of being sedentary.  At this point, she is likely unable to take NSAIDs due to recent GI bleed.  We have discussed that her knee pain could be coming from her knees but also from her hip.  Because she is having start up pain in the knees, I think she would maybe benefit from cortisone injections which she would like to proceed with today.  If she does not have any relief even during the anesthetic phase, we will look further into her hips.  In regards to her back, we have discussed a spine conditioning program at home versus formal physical therapy.  She has elected to try the home exercise program.  She will follow-up with us  as needed.  Follow-Up Instructions: Return if symptoms worsen or fail to improve.   Orders:  Orders Placed This Encounter  Procedures   Large Joint Inj   XR KNEE 3 VIEW RIGHT   XR KNEE 3 VIEW LEFT   XR Lumbar Spine 2-3 Views   No orders of the defined types were placed in this encounter.     Procedures: Large Joint Inj: bilateral knee on 06/21/2024 9:05 AM Indications: pain Details: 22 G needle, anterolateral approach Medications (Right): 0.66 mL bupivacaine  0.25 %; 3 mL lidocaine  1 %; 13.33 mg methylPREDNISolone  acetate 40 MG/ML Medications (Left): 0.66 mL bupivacaine  0.25 %; 3 mL lidocaine  1 %; 13.33 mg methylPREDNISolone  acetate 40 MG/ML      Clinical Data: No additional  findings.   Subjective: Chief Complaint  Patient presents with   Lower Back - Pain   Left Knee - Pain   Right Knee - Pain    HPI patient is a pleasant 53 year old female who comes in today with bilateral knee pain and low back pain.  She tells me she has been in rehab for alcoholism and has been sober for over 90 days.  About a month and a half ago, she started working out at [employer]  She thinks this may be what has contributed to her knee and back pain.  In regards to her knees, she is having pain throughout the entire aspect of both knees.  Symptoms are constant but worse when she is going from a seated to standing position as well as with stair climbing.  She has tried Voltaren  without relief.  She does have a recent history of GI bleed so is unable to take NSAIDs.  In regards to her back, symptoms began when she started working out at the gym.  Her pain is across the low back.  She has associated stiffness.  Symptoms appear to be worse when she is on her feet a lot as well as when she is stretching.  She does  note occasional burning in her thighs.  Review of Systems as detailed in HPI.  All others reviewed and are negative.   Objective: Vital Signs: LMP 03/05/2014 Comment: LMP x 58yrs ago   Physical Exam well-developed well-nourished female in no acute distress.  Alert and oriented x 3.  Ortho Exam bilateral knee exam: No effusion.  Range of motion 0 to 125 degrees.  No joint line tenderness.  No patellofemoral crepitus.  She is neurovascular intact distally.  She does have a pain with logroll and FADIR testing on the right.  No pain with logroll and FADIR testing on the left.  Negative Stinchfield both sides.  Negative straight leg raise both sides.  No focal weakness.  No spinous or paraspinous tenderness.  Minimal pain with lumbar flexion.  No pain with lumbar extension.  She is neurovascularly intact distally.  Specialty Comments:  No specialty comments available.  Imaging: XR  Lumbar Spine 2-3 Views Result Date: 06/21/2024 No acute or structural abnormalities  XR KNEE 3 VIEW RIGHT Result Date: 06/21/2024 Minimal degenerative changes to the medial and patellofemoral compartments  XR KNEE 3 VIEW LEFT Result Date: 06/21/2024 Minimal degenerative changes to the medial compartment    PMFS History: Patient Active Problem List   Diagnosis Date Noted   Restless legs 06/01/2024   Abdominal bloating 06/01/2024   Vitamin D  deficiency 11/06/2022   Periumbilical hernia 08/04/2022   GERD (gastroesophageal reflux disease) 04/07/2022   Frequent headaches 10/02/2021   Dyspareunia, female 04/24/2020   GAD (generalized anxiety disorder) 01/02/2020   Alcohol abuse, in remission 01/02/2020   Seasonal allergies 01/16/2019   Elevated LFTs 11/15/2018   Hyperlipidemia 07/08/2018   Asthma 08/20/2017   Encounter for annual general medical examination with abnormal findings in adult 06/21/2017   Insomnia 04/16/2017   Essential hypertension 04/16/2017   Past Medical History:  Diagnosis Date   AC separation, right, initial encounter    Acute conjunctivitis of left eye 12/27/2020   Acute right lower quadrant pain 06/10/2018   Alcohol abuse    Anxiety    Arthritis    Asthma    Closed fracture of head of metacarpal bone 09/17/2023   Hand injury, right, initial encounter 09/26/2019   History of fracture of clavicle 06/11/2020   Hypertension    Insomnia    Insomnia    Pain of upper abdomen 06/21/2017   Palpitations    a. 05/2019 Echo: EF 60-65%, diast dysfxn; b. 05/2019 Zio: Sinus rhythm-sinus tach. Avg HR 105. 6 SVT episodes - longest 7 beats. Rare PVCs-->Diltiazem  added.   Periumbilical abdominal pain 03/12/2020   PONV (postoperative nausea and vomiting)    Spongiotic dermatitis     Family History  Problem Relation Age of Onset   Arthritis Mother    Kidney disease Mother    Alcohol abuse Father    Arthritis Father    Lung cancer Father    Heart attack Father     Lymphoma Father    Diabetes Father    Epilepsy Brother    Breast cancer Maternal Aunt    Hyperlipidemia Maternal Grandmother    Stroke Maternal Grandmother    Hypertension Maternal Grandmother    Heart attack Maternal Grandmother    Diabetes Maternal Grandmother    Alcohol abuse Paternal Grandmother    Arthritis Paternal Grandmother    Mental illness Paternal Grandmother    Lupus Son    Colon cancer Neg Hx    Esophageal cancer Neg Hx    Stomach cancer Neg Hx  Rectal cancer Neg Hx     Past Surgical History:  Procedure Laterality Date   ABDOMINAL HYSTERECTOMY     CESAREAN SECTION     FOOT FRACTURE SURGERY Right    OPEN REDUCTION INTERNAL FIXATION (ORIF) METACARPAL Right 09/17/2023   Procedure: RIGHT INDEX FINGER METACARPAL HEAD FRACTURE OPEN REDUCTION INTERNAL FIXATION (ORIF) METACARPAL;  Surgeon: Arlinda Buster, MD;  Location: [facility];  Service: Orthopedics;  Laterality: Right;   ORIF CLAVICULAR FRACTURE Right 06/17/2020   Procedure: OPEN REDUCTION INTERNAL FIXATION (ORIF) RIGHT CLAVICLE FRACTURE, ACROMIOCLAVICULAR SEPARATION;  Surgeon: Jerri Kay HERO, MD;  Location: [facility];  Service: Orthopedics;  Laterality: Right;   ORIF CLAVICULAR FRACTURE Right 07/05/2020   Procedure: REVISION OPEN REDUCTION INTERNAL FIXATION (ORIF) RIGHT CLAVICLE FRACTURE, ACROMIOCLAVICULAR SEPARATION;  Surgeon: Jerri Kay HERO, MD;  Location: [facility];  Service: Orthopedics;  Laterality: Right;   PERCUTANEOUS PINNING PHALANX FRACTURE OF HAND Right    ROBOTIC ASSISTED TOTAL HYSTERECTOMY N/A 03/05/2014   Procedure: ROBOTIC ASSISTED TOTAL HYSTERECTOMY/BILATERAL SALPINGECTOMY;  Surgeon: Dickie DELENA Carder, MD;  Location: [facility];  Service: Gynecology;  Laterality: N/A;   TUBAL LIGATION     Social History   Occupational History   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 Pharm Tech  Tobacco Use   Smoking status: Never   Smokeless  tobacco: Never  Vaping Use   Vaping status: Never Used  Substance and Sexual Activity   Alcohol use: Yes    Comment: once a week-alcohol drink   Drug use: No   Sexual activity: Yes    Birth control/protection: Surgical    Comment: hyst

## 2024-06-22 NOTE — Telephone Encounter (Signed)
 Can we send this to the appropriate person to get her general surgery referral moving?

## 2024-06-27 ENCOUNTER — Other Ambulatory Visit: Payer: Self-pay | Admitting: Primary Care

## 2024-06-27 DIAGNOSIS — J4520 Mild intermittent asthma, uncomplicated: Secondary | ICD-10-CM

## 2024-06-28 ENCOUNTER — Other Ambulatory Visit: Payer: Self-pay | Admitting: Primary Care

## 2024-06-28 DIAGNOSIS — J4520 Mild intermittent asthma, uncomplicated: Secondary | ICD-10-CM

## 2024-06-28 MED ORDER — MONTELUKAST SODIUM 10 MG PO TABS: 10.0000 mg | ORAL_TABLET | Freq: Every day | ORAL | 2 refills | Status: AC

## 2024-07-10 ENCOUNTER — Encounter: Payer: Self-pay | Admitting: Radiology

## 2024-07-11 ENCOUNTER — Other Ambulatory Visit: Payer: Self-pay | Admitting: Internal Medicine

## 2024-07-11 DIAGNOSIS — G4700 Insomnia, unspecified: Secondary | ICD-10-CM

## 2024-07-11 MED ORDER — QUETIAPINE FUMARATE 100 MG PO TABS: 100.0000 mg | ORAL_TABLET | Freq: Every day | ORAL | 0 refills | Status: DC

## 2024-07-11 NOTE — Telephone Encounter (Unsigned)
 Copied from CRM #[id number]. Topic: Clinical - Medication Refill >> Jul 11, 2024  9:35 AM Emylou G wrote: Medication: QUEtiapine (SEROQUEL) 100 MG tablet  Has the patient contacted their pharmacy? No (Agent: If no, request that the patient contact the pharmacy for the refill. If patient does not wish to contact the pharmacy document the reason why and proceed with request.) (Agent: If yes, when and what did the pharmacy advise?)  This is the patient's preferred pharmacy:  [id number] - [place], [place] - [address] AT [facility] [address] [place] [address] Phone: [phone] Fax: [phone]  Is this the correct pharmacy for this prescription? Yes If no, delete pharmacy and type the correct one.   Has the prescription been filled recently? No  Is the patient out of the medication? Yes  Has the patient been seen for an appointment in the last year OR does the patient have an upcoming appointment? Yes  Can we respond through MyChart? Yes  Agent: Please be advised that Rx refills may take up to 3 business days. We ask that you follow-up with your pharmacy.

## 2024-07-16 ENCOUNTER — Other Ambulatory Visit: Payer: Self-pay | Admitting: Behavioral Health

## 2024-07-16 DIAGNOSIS — F411 Generalized anxiety disorder: Secondary | ICD-10-CM

## 2024-09-29 ENCOUNTER — Other Ambulatory Visit: Payer: Self-pay | Admitting: Primary Care

## 2024-09-29 DIAGNOSIS — G4700 Insomnia, unspecified: Secondary | ICD-10-CM

## 2024-09-29 NOTE — Telephone Encounter (Signed)
 Copied from CRM #[id number]. Topic: Clinical - Medication Question >> Sep 29, 2024 10:09 AM Robinson H wrote: Reason for CRM: Patient returning call to office from [facility] regarding medication, agent asked patient providers message as stated and patient reported she is taking the QUEtiapine  (SEROQUEL ) 100 MG tablet at night for sleep. Patient states only medication she takes is the QUEtiapine  (SEROQUEL ) 100 MG tablet and the montelukast  (SINGULAIR ) 10 MG tablet.  Jena [phone]

## 2024-09-29 NOTE — Telephone Encounter (Signed)
 Please call patient: Is she still taking Seroquel  medication? Received refill request.

## 2024-09-29 NOTE — Telephone Encounter (Signed)
 Lvm asking pt to call back. Pls get answer to Kate's question?

## 2024-09-29 NOTE — Telephone Encounter (Signed)
 Noted,
